# Patient Record
Sex: Female | Born: 1950 | Race: White | Hispanic: No | Marital: Married | Smoking: Former smoker
Health system: Southern US, Community
[De-identification: ages and names within clinical notes are randomized; demographics above are authoritative.]

## PROBLEM LIST (undated history)

## (undated) DIAGNOSIS — I10 Essential (primary) hypertension: Secondary | ICD-10-CM

## (undated) DIAGNOSIS — Q898 Other specified congenital malformations: Secondary | ICD-10-CM

## (undated) DIAGNOSIS — M720 Palmar fascial fibromatosis [Dupuytren]: Secondary | ICD-10-CM

## (undated) DIAGNOSIS — F32A Depression, unspecified: Secondary | ICD-10-CM

## (undated) DIAGNOSIS — E559 Vitamin D deficiency, unspecified: Secondary | ICD-10-CM

## (undated) DIAGNOSIS — M549 Dorsalgia, unspecified: Secondary | ICD-10-CM

## (undated) DIAGNOSIS — M79606 Pain in leg, unspecified: Secondary | ICD-10-CM

## (undated) DIAGNOSIS — C50919 Malignant neoplasm of unspecified site of unspecified female breast: Secondary | ICD-10-CM

## (undated) DIAGNOSIS — F329 Major depressive disorder, single episode, unspecified: Secondary | ICD-10-CM

## (undated) DIAGNOSIS — R600 Localized edema: Secondary | ICD-10-CM

## (undated) DIAGNOSIS — K219 Gastro-esophageal reflux disease without esophagitis: Secondary | ICD-10-CM

## (undated) DIAGNOSIS — M1990 Unspecified osteoarthritis, unspecified site: Secondary | ICD-10-CM

## (undated) DIAGNOSIS — IMO0002 Reserved for concepts with insufficient information to code with codable children: Secondary | ICD-10-CM

## (undated) DIAGNOSIS — M2550 Pain in unspecified joint: Secondary | ICD-10-CM

## (undated) DIAGNOSIS — E669 Obesity, unspecified: Secondary | ICD-10-CM

## (undated) DIAGNOSIS — M79673 Pain in unspecified foot: Secondary | ICD-10-CM

## (undated) DIAGNOSIS — E785 Hyperlipidemia, unspecified: Secondary | ICD-10-CM

## (undated) DIAGNOSIS — R0602 Shortness of breath: Secondary | ICD-10-CM

## (undated) DIAGNOSIS — F419 Anxiety disorder, unspecified: Secondary | ICD-10-CM

## (undated) DIAGNOSIS — G4730 Sleep apnea, unspecified: Secondary | ICD-10-CM

## (undated) HISTORY — DX: Reserved for concepts with insufficient information to code with codable children: IMO0002

## (undated) HISTORY — DX: Pain in leg, unspecified: M79.606

## (undated) HISTORY — DX: Unspecified osteoarthritis, unspecified site: M19.90

## (undated) HISTORY — PX: TONSILLECTOMY AND ADENOIDECTOMY: SUR1326

## (undated) HISTORY — DX: Hyperlipidemia, unspecified: E78.5

## (undated) HISTORY — PX: NASAL SINUS SURGERY: SHX719

## (undated) HISTORY — DX: Depression, unspecified: F32.A

## (undated) HISTORY — DX: Palmar fascial fibromatosis (dupuytren): M72.0

## (undated) HISTORY — PX: CATARACT EXTRACTION: SUR2

## (undated) HISTORY — DX: Other specified congenital malformations: Q89.8

## (undated) HISTORY — DX: Vitamin D deficiency, unspecified: E55.9

## (undated) HISTORY — DX: Malignant neoplasm of unspecified site of unspecified female breast: C50.919

## (undated) HISTORY — DX: Gastro-esophageal reflux disease without esophagitis: K21.9

## (undated) HISTORY — DX: Shortness of breath: R06.02

## (undated) HISTORY — DX: Sleep apnea, unspecified: G47.30

## (undated) HISTORY — DX: Obesity, unspecified: E66.9

## (undated) HISTORY — DX: Pain in unspecified joint: M25.50

## (undated) HISTORY — DX: Major depressive disorder, single episode, unspecified: F32.9

## (undated) HISTORY — DX: Essential (primary) hypertension: I10

## (undated) HISTORY — DX: Dorsalgia, unspecified: M54.9

## (undated) HISTORY — DX: Localized edema: R60.0

## (undated) HISTORY — DX: Pain in unspecified foot: M79.673

## (undated) HISTORY — DX: Anxiety disorder, unspecified: F41.9

---

## 1975-12-17 HISTORY — PX: TUBAL LIGATION: SHX77

## 1994-12-16 HISTORY — PX: PELVIC LAPAROSCOPY: SHX162

## 1998-03-16 ENCOUNTER — Other Ambulatory Visit: Admission: RE | Admit: 1998-03-16 | Discharge: 1998-03-16 | Payer: Self-pay | Admitting: Obstetrics and Gynecology

## 1998-09-28 ENCOUNTER — Ambulatory Visit (HOSPITAL_COMMUNITY): Admission: RE | Admit: 1998-09-28 | Discharge: 1998-09-28 | Payer: Self-pay | Admitting: Obstetrics and Gynecology

## 1999-03-02 ENCOUNTER — Other Ambulatory Visit: Admission: RE | Admit: 1999-03-02 | Discharge: 1999-03-02 | Payer: Self-pay | Admitting: Obstetrics and Gynecology

## 1999-10-30 ENCOUNTER — Ambulatory Visit (HOSPITAL_COMMUNITY): Admission: RE | Admit: 1999-10-30 | Discharge: 1999-10-30 | Payer: Self-pay | Admitting: Obstetrics and Gynecology

## 1999-10-30 ENCOUNTER — Encounter: Payer: Self-pay | Admitting: Obstetrics and Gynecology

## 2000-02-18 ENCOUNTER — Other Ambulatory Visit: Admission: RE | Admit: 2000-02-18 | Discharge: 2000-02-18 | Payer: Self-pay | Admitting: Obstetrics and Gynecology

## 2000-11-05 ENCOUNTER — Encounter: Payer: Self-pay | Admitting: Obstetrics and Gynecology

## 2000-11-05 ENCOUNTER — Ambulatory Visit (HOSPITAL_COMMUNITY): Admission: RE | Admit: 2000-11-05 | Discharge: 2000-11-05 | Payer: Self-pay | Admitting: Obstetrics and Gynecology

## 2001-03-25 ENCOUNTER — Other Ambulatory Visit: Admission: RE | Admit: 2001-03-25 | Discharge: 2001-03-25 | Payer: Self-pay | Admitting: Obstetrics and Gynecology

## 2001-11-09 ENCOUNTER — Encounter: Payer: Self-pay | Admitting: Family Medicine

## 2001-11-09 ENCOUNTER — Ambulatory Visit (HOSPITAL_COMMUNITY): Admission: RE | Admit: 2001-11-09 | Discharge: 2001-11-09 | Payer: Self-pay | Admitting: Family Medicine

## 2002-04-01 ENCOUNTER — Other Ambulatory Visit: Admission: RE | Admit: 2002-04-01 | Discharge: 2002-04-01 | Payer: Self-pay | Admitting: Obstetrics and Gynecology

## 2002-04-13 ENCOUNTER — Encounter: Admission: RE | Admit: 2002-04-13 | Discharge: 2002-04-13 | Payer: Self-pay | Admitting: Family Medicine

## 2002-04-13 ENCOUNTER — Encounter: Payer: Self-pay | Admitting: Family Medicine

## 2002-12-06 ENCOUNTER — Encounter: Payer: Self-pay | Admitting: Obstetrics and Gynecology

## 2002-12-06 ENCOUNTER — Ambulatory Visit (HOSPITAL_COMMUNITY): Admission: RE | Admit: 2002-12-06 | Discharge: 2002-12-06 | Payer: Self-pay | Admitting: Obstetrics and Gynecology

## 2003-04-12 ENCOUNTER — Other Ambulatory Visit: Admission: RE | Admit: 2003-04-12 | Discharge: 2003-04-12 | Payer: Self-pay | Admitting: Obstetrics and Gynecology

## 2003-10-04 ENCOUNTER — Ambulatory Visit (HOSPITAL_COMMUNITY): Admission: RE | Admit: 2003-10-04 | Discharge: 2003-10-04 | Payer: Self-pay | Admitting: Gastroenterology

## 2003-10-04 ENCOUNTER — Encounter (INDEPENDENT_AMBULATORY_CARE_PROVIDER_SITE_OTHER): Payer: Self-pay | Admitting: *Deleted

## 2003-12-14 ENCOUNTER — Ambulatory Visit (HOSPITAL_COMMUNITY): Admission: RE | Admit: 2003-12-14 | Discharge: 2003-12-14 | Payer: Self-pay | Admitting: Obstetrics and Gynecology

## 2003-12-21 ENCOUNTER — Encounter: Admission: RE | Admit: 2003-12-21 | Discharge: 2003-12-21 | Payer: Self-pay | Admitting: Obstetrics and Gynecology

## 2004-07-03 ENCOUNTER — Other Ambulatory Visit: Admission: RE | Admit: 2004-07-03 | Discharge: 2004-07-03 | Payer: Self-pay | Admitting: Obstetrics and Gynecology

## 2004-10-02 ENCOUNTER — Encounter: Admission: RE | Admit: 2004-10-02 | Discharge: 2004-10-02 | Payer: Self-pay | Admitting: Family Medicine

## 2005-01-08 ENCOUNTER — Ambulatory Visit (HOSPITAL_COMMUNITY): Admission: RE | Admit: 2005-01-08 | Discharge: 2005-01-08 | Payer: Self-pay | Admitting: Obstetrics and Gynecology

## 2005-07-16 ENCOUNTER — Other Ambulatory Visit: Admission: RE | Admit: 2005-07-16 | Discharge: 2005-07-16 | Payer: Self-pay | Admitting: Obstetrics and Gynecology

## 2006-01-29 ENCOUNTER — Ambulatory Visit (HOSPITAL_COMMUNITY): Admission: RE | Admit: 2006-01-29 | Discharge: 2006-01-29 | Payer: Self-pay | Admitting: Family Medicine

## 2006-10-27 ENCOUNTER — Other Ambulatory Visit: Admission: RE | Admit: 2006-10-27 | Discharge: 2006-10-27 | Payer: Self-pay | Admitting: Obstetrics & Gynecology

## 2007-02-26 ENCOUNTER — Ambulatory Visit (HOSPITAL_COMMUNITY): Admission: RE | Admit: 2007-02-26 | Discharge: 2007-02-26 | Payer: Self-pay | Admitting: Family Medicine

## 2007-10-29 ENCOUNTER — Other Ambulatory Visit: Admission: RE | Admit: 2007-10-29 | Discharge: 2007-10-29 | Payer: Self-pay | Admitting: Obstetrics and Gynecology

## 2008-03-01 ENCOUNTER — Ambulatory Visit (HOSPITAL_COMMUNITY): Admission: RE | Admit: 2008-03-01 | Discharge: 2008-03-01 | Payer: Self-pay | Admitting: Family Medicine

## 2008-12-01 ENCOUNTER — Ambulatory Visit (HOSPITAL_COMMUNITY): Admission: RE | Admit: 2008-12-01 | Discharge: 2008-12-02 | Payer: Self-pay | Admitting: Otolaryngology

## 2009-01-10 ENCOUNTER — Other Ambulatory Visit: Admission: RE | Admit: 2009-01-10 | Discharge: 2009-01-10 | Payer: Self-pay | Admitting: Obstetrics & Gynecology

## 2009-04-04 ENCOUNTER — Ambulatory Visit (HOSPITAL_COMMUNITY): Admission: RE | Admit: 2009-04-04 | Discharge: 2009-04-04 | Payer: Self-pay | Admitting: Family Medicine

## 2009-04-07 ENCOUNTER — Encounter: Admission: RE | Admit: 2009-04-07 | Discharge: 2009-04-07 | Payer: Self-pay | Admitting: Family Medicine

## 2010-05-07 ENCOUNTER — Encounter: Admission: RE | Admit: 2010-05-07 | Discharge: 2010-05-07 | Payer: Self-pay | Admitting: Family Medicine

## 2011-01-05 ENCOUNTER — Encounter: Payer: Self-pay | Admitting: Obstetrics and Gynecology

## 2011-01-06 ENCOUNTER — Encounter: Payer: Self-pay | Admitting: Family Medicine

## 2011-01-08 ENCOUNTER — Encounter
Admission: RE | Admit: 2011-01-08 | Discharge: 2011-01-08 | Payer: Self-pay | Source: Home / Self Care | Attending: Family Medicine | Admitting: Family Medicine

## 2011-04-30 NOTE — Op Note (Signed)
NAMEROBERT, SUNGA                ACCOUNT NO.:  [id number]   MEDICAL RECORD NO.:  [id number]          PATIENT TYPE:  OIB   LOCATION:  6532                         FACILITY:  [facility]   PHYSICIAN:  Kinnie Scales. Annalee Genta, M.D.DATE OF BIRTH:  [birth year]   DATE OF PROCEDURE:  12/01/2008  DATE OF DISCHARGE:                               OPERATIVE REPORT   PREOPERATIVE DIAGNOSES:  1. Deviated nasal septum with airway obstruction.  2. Status post prior nasal surgery.  3. Inferior turbinate hypertrophy.  4. Bilateral nasal vestibular collapse.  5. History of moderately severe obstructive sleep apnea.   POSTOPERATIVE DIAGNOSES:  1. Deviated nasal septum with airway obstruction.  2. Status post prior nasal surgery.  3. Inferior turbinate hypertrophy.  4. Bilateral nasal vestibular collapse.  5. History of moderately severe obstructive sleep apnea.   INDICATIONS FOR SURGERY:  1. Deviated nasal septum with airway obstruction.  2. Status post prior nasal surgery.  3. Inferior turbinate hypertrophy.  4. Bilateral nasal vestibular collapse.  5. History of moderately severe obstructive sleep apnea.   SURGICAL PROCEDURES:  1. Revision nasal septoplasty.  2. Bilateral inferior turbinate reduction.  3. Bilateral nasal vestibuloplasty.   SURGEON:  Kinnie Scales. Annalee Genta, MD   ANESTHESIA:  General endotracheal.   COMPLICATIONS:  None.   ESTIMATED BLOOD LOSS:  50 mL.   DISPOSITION:  The patient was transferred from the operating room to the  recovery room in stable condition.   BRIEF HISTORY:  Ms. Culmer is a 60 year old white female with history of  obstructive sleep apnea and progressive nasal airway obstruction.  She  had undergone prior nasal surgery approximately 30-40 years before  presentation.  She had significant issues with nasal obstruction and  nasal scarring with complete obstruction of the right nasal passageway.  Previous surgery also included some cosmetic procedure, which  resulted  in scarring and nasal vestibular collapse.  The patient has developed  obstructive sleep apnea and is unable to wear a CPAP because of nasal  airway obstruction.  She presents to our office for evaluation and  workup.  Given her history, examination, and findings, I recommended to  undertake the above surgical procedures.  The risks, benefits, and  possible complications of procedure were discussed in detail with the  patient who understood and concurred to our plan.  Surgery was scheduled  at the [facility] Main OR with overnight observation in  stepdown for monitoring of potential sleep apnea complications.   SURGICAL PROCEDURES:  The patient is a 60 year old white female who was  brought to the operating room at [facility] on December 01, 2008.  The patient was positioned on the operating table, prepped and  draped in sterile fashion.  General endotracheal anesthesia was  established without difficulty.  When the patient was adequately  anesthetized, her nose was injected with a total of 7 mL of 1% lidocaine  with 1:100,000 solution of epinephrine.  Local anesthetic was injected  in submucosal fashion on the nasal septum, inferior turbinates, and  lateral nasal wall bilaterally.  The patient's nose was then packed with  Afrin-soaked cottonoid pledgets and that was placed for approximately 10  minutes to allow for vasoconstriction and hemostasis.  With the patient  positioned, prepped and draped, surgical procedure was begun.   Revision nasal septoplasty was undertaken.  A right anterior  hemitransfixion incision was created.  The mucoperichondrial flap was  elevated from anterior to posterior along the patient's right-hand side.  Severely deviated bone and cartilage were identified, and there was a  significant amount of scarring from her previous nasal septal surgery.  A large inferior septal bone spur and hypertrophic cartilage growth was  then  mobilized.  Mucoperichondrial flap on the left-hand side was  elevated after crossing the mucoperichondrial junction.  Deviated bone  and cartilage were removed using a 4-mm osteotome and suction elevator  and resected.  Bone and cartilage were removed and then returned at the  conclusion of the surgical procedure.  The mucoperichondrial flaps  reapproximated with a 4-0 gut suture on a Keith needle in horizontal  mattress fashion.  At the conclusion of the surgery, bilateral Doyle  nasal septal splints were placed after the application of Bactroban  ointment and was sutured in position with a 3-0 Ethilon suture.   Bilateral inferior turbinate reduction was then performed with cautery  set at 12 watts.  Two submucosal passes were made in each inferior  turbinate.  When the turbinates had been adequately cauterized, a small  anterior incision was created and turbinate bone was resected preserving  the overlying mucosa.  Turbinates were then outfractured creating more  patent nasal cavity.   Bilateral nasal vestibuloplasty was then undertaken.  The patient had  significant collapse of the nasal valve at the level of the lower  lateral cartilage and lateral nasal ala.  Small approximately 5-mm stab  incision was created in the mucosa, and then a soft tissue tunnel was  elevated to the level of the nasal bony aperture bilaterally at the  anterior aspect of the maxilla.  A small portion of septal cartilage was  trimmed and positioned for a vestibular batten.  This was inserted to  the level of the maxillary aperture for stability and then sutured into  position.  The mucosa was closed with a 4-0 gut suture and a 4-0 PDS was  used to lateralize the nasal alar complex by suturing into the deep soft  tissue bilaterally, lateral cheek.  The same procedure was carried out  bilaterally with good nasal patency at the conclusion of the surgical  procedure.   The patient's nasal cavity, nasopharynx,  oral cavity, and oropharynx  were irrigated and suctioned.  Orogastric tube was passed.  Stomach  contents were aspirated.  The patient was then awakened from anesthetic.  She was extubated and was transferred from the operating room to the  recovery room in stable condition.  There were no complications.  The  blood loss was less than 50 mL.           ______________________________  Kinnie Scales. Annalee Genta, M.D.     DLS/MEDQ  D:  56/21/3086  T:  12/02/2008  Job:  [id number]

## 2011-05-03 NOTE — Op Note (Signed)
   NAME:  Jackie Montoya, Jackie Montoya                         ACCOUNT NO.:  [id number]   MEDICAL RECORD NO.:  [id number]                   PATIENT TYPE:  AMB   LOCATION:  ENDO                                 FACILITY:  [facility]   PHYSICIAN:  Graylin Shiver, M.D.                DATE OF BIRTH:  [birth year]   DATE OF PROCEDURE:  10/04/2003  DATE OF DISCHARGE:                                 OPERATIVE REPORT   PROCEDURE:  Colonoscopy with biopsy.   INDICATIONS FOR PROCEDURE:  Screening. Informed consent was obtained after  explanation of the risks of bleeding, infection and perforation.   PREMEDICATIONS:  Fentanyl 100 micrograms IV, Versed 10 mg IV.   DESCRIPTION OF PROCEDURE:  With the patient in the left lateral decubitus  position a rectal examination  was performed and no masses were felt. The  Olympus colonoscope was inserted into the rectum and advanced  around the  colon to the cecum.   The cecal landmarks were identified. The cecum and ascending colon were  normal. The transverse colon was normal. In the mid transverse colon there  was a small, 3-mm sessile polyp removed with cold forceps. The descending  colon, sigmoid and  rectum were normal. The patient tolerated the procedure  well without complications.   IMPRESSION:  A small  transverse colon polyp.   PLAN:  The pathology will be checked.                                               Graylin Shiver, M.D.    SFG/MEDQ  D:  10/04/2003  T:  10/04/2003  Job:  [id number]   cc:   Gretta Arab. Valentina Lucks, M.D.  [address] [place]  [place] [address]  Fax: [phone]

## 2011-05-31 ENCOUNTER — Other Ambulatory Visit: Payer: Self-pay | Admitting: Family Medicine

## 2011-05-31 ENCOUNTER — Ambulatory Visit
Admission: RE | Admit: 2011-05-31 | Discharge: 2011-05-31 | Disposition: A | Discharge status: Home / Self Care | Payer: BC Managed Care – PPO | Source: Ambulatory Visit | Attending: Family Medicine | Admitting: Family Medicine

## 2011-05-31 DIAGNOSIS — R053 Chronic cough: Secondary | ICD-10-CM

## 2011-05-31 DIAGNOSIS — R05 Cough: Secondary | ICD-10-CM

## 2011-06-04 ENCOUNTER — Other Ambulatory Visit: Payer: Self-pay | Admitting: Family Medicine

## 2011-06-04 DIAGNOSIS — Z1231 Encounter for screening mammogram for malignant neoplasm of breast: Secondary | ICD-10-CM

## 2011-06-10 ENCOUNTER — Ambulatory Visit
Admission: RE | Admit: 2011-06-10 | Discharge: 2011-06-10 | Disposition: A | Discharge status: Home / Self Care | Payer: BC Managed Care – PPO | Source: Ambulatory Visit | Attending: Family Medicine | Admitting: Family Medicine

## 2011-06-10 DIAGNOSIS — Z1231 Encounter for screening mammogram for malignant neoplasm of breast: Secondary | ICD-10-CM

## 2011-06-25 ENCOUNTER — Ambulatory Visit (INDEPENDENT_AMBULATORY_CARE_PROVIDER_SITE_OTHER): Payer: BC Managed Care – PPO | Admitting: Internal Medicine

## 2011-06-25 DIAGNOSIS — R05 Cough: Secondary | ICD-10-CM

## 2011-06-25 LAB — PULMONARY FUNCTION TEST

## 2011-06-25 NOTE — Progress Notes (Signed)
PFT done today. 

## 2011-09-20 LAB — CBC
HCT: 44.7 % (ref 36.0–46.0)
Hemoglobin: 15 g/dL (ref 12.0–15.0)
MCHC: 33.5 g/dL (ref 30.0–36.0)
MCV: 89.4 fL (ref 78.0–100.0)
RDW: 13 % (ref 11.5–15.5)
WBC: 7 10*3/uL (ref 4.0–10.5)

## 2011-09-20 LAB — BASIC METABOLIC PANEL
CO2: 29 mEq/L (ref 19–32)
Calcium: 9.6 mg/dL (ref 8.4–10.5)
Creatinine, Ser: 0.65 mg/dL (ref 0.4–1.2)
GFR calc Af Amer: >60 mL/min (ref >60)
GFR calc non Af Amer: >60 mL/min (ref >60)

## 2011-10-09 ENCOUNTER — Other Ambulatory Visit: Payer: Self-pay | Admitting: Dermatology

## 2012-06-02 ENCOUNTER — Other Ambulatory Visit: Payer: Self-pay | Admitting: Family Medicine

## 2012-06-02 DIAGNOSIS — Z1231 Encounter for screening mammogram for malignant neoplasm of breast: Secondary | ICD-10-CM

## 2012-06-22 ENCOUNTER — Ambulatory Visit: Payer: BC Managed Care – PPO

## 2012-07-20 ENCOUNTER — Ambulatory Visit
Admission: RE | Admit: 2012-07-20 | Discharge: 2012-07-20 | Disposition: A | Discharge status: Home / Self Care | Payer: BC Managed Care – PPO | Source: Ambulatory Visit | Attending: Family Medicine | Admitting: Family Medicine

## 2012-07-20 DIAGNOSIS — Z1231 Encounter for screening mammogram for malignant neoplasm of breast: Secondary | ICD-10-CM

## 2013-07-28 ENCOUNTER — Other Ambulatory Visit: Payer: Self-pay | Admitting: Nurse Practitioner

## 2013-07-28 NOTE — Telephone Encounter (Signed)
eScribe request for refill on VITAMIN D  Last filled -  Last AEX -   Next AEX - not scheduled

## 2013-07-29 NOTE — Telephone Encounter (Signed)
eScribe request for refill on VITAMIN D 50K Last filled - 07/22/12 X 1 YEAR Last AEX - 07/22/12 Last Vitamin D check - 07/22/12 = 34 Next AEX - not scheduled Please advise refills.  Paper chart on your shelf.

## 2013-07-29 NOTE — Telephone Encounter (Signed)
Ok to refill X 3 months until she comes in for AEX.

## 2013-07-30 NOTE — Telephone Encounter (Signed)
RX sent with note appt needed for more refills.

## 2013-09-14 ENCOUNTER — Other Ambulatory Visit: Payer: Self-pay

## 2013-09-14 DIAGNOSIS — Z1231 Encounter for screening mammogram for malignant neoplasm of breast: Secondary | ICD-10-CM

## 2013-10-01 ENCOUNTER — Ambulatory Visit
Admission: RE | Admit: 2013-10-01 | Discharge: 2013-10-01 | Disposition: A | Discharge status: Home / Self Care | Payer: BC Managed Care – PPO | Source: Ambulatory Visit

## 2013-10-01 DIAGNOSIS — Z1231 Encounter for screening mammogram for malignant neoplasm of breast: Secondary | ICD-10-CM

## 2013-10-16 DIAGNOSIS — R87619 Unspecified abnormal cytological findings in specimens from cervix uteri: Secondary | ICD-10-CM

## 2013-10-16 DIAGNOSIS — IMO0002 Reserved for concepts with insufficient information to code with codable children: Secondary | ICD-10-CM

## 2013-10-16 HISTORY — DX: Unspecified abnormal cytological findings in specimens from cervix uteri: R87.619

## 2013-10-16 HISTORY — DX: Reserved for concepts with insufficient information to code with codable children: IMO0002

## 2013-10-18 ENCOUNTER — Ambulatory Visit (INDEPENDENT_AMBULATORY_CARE_PROVIDER_SITE_OTHER): Payer: BC Managed Care – PPO | Admitting: Nurse Practitioner

## 2013-10-18 ENCOUNTER — Encounter: Payer: Self-pay | Admitting: Nurse Practitioner

## 2013-10-18 VITALS — BP 140/82 | HR 88 | Resp 20 | Ht 65.25 in | Wt 264.0 lb

## 2013-10-18 DIAGNOSIS — Z0000 Encounter for general adult medical examination without abnormal findings: Secondary | ICD-10-CM

## 2013-10-18 DIAGNOSIS — E559 Vitamin D deficiency, unspecified: Secondary | ICD-10-CM

## 2013-10-18 DIAGNOSIS — Z01419 Encounter for gynecological examination (general) (routine) without abnormal findings: Secondary | ICD-10-CM

## 2013-10-18 LAB — POCT URINALYSIS DIPSTICK
Bilirubin, UA: NEGATIVE
Blood, UA: NEGATIVE
Nitrite, UA: NEGATIVE
Protein, UA: NEGATIVE
Urobilinogen, UA: NEGATIVE
pH, UA: 5

## 2013-10-18 MED ORDER — VITAMIN D (ERGOCALCIFEROL) 1.25 MG (50000 UNIT) PO CAPS: 50000.0000 [IU] | ORAL_CAPSULE | ORAL | Status: DC

## 2013-10-18 NOTE — Progress Notes (Signed)
Patient ID: Jackie Montoya, female   DOB: 08-30-1951, 62 y.o.   MRN: [id number] 62 y.o. [id number] Widowed Caucasian Fe here for annual exam.  She feels well. No new health concerns except for a slight elevated blood sugar.  She is now on a diet and trying to increase exercise.  No LMP recorded. Patient is postmenopausal.          Sexually active: yes  The current method of family planning is post menopausal status.    Exercising: yes  Home exercise routine includes walking and trying to do weights, but not enough per patient.. Smoker:  former  Health Maintenance: Pap:  07/22/12, WNL, pos HR HPV, neg 16/18 MMG:  10/01/13, normal Colonoscopy:  2009, repeat in 5 years, will wait until 2015 for work reasons BMD:   01/30/06, normal.  Pt has had repeat since that time with Dr. Valentina Lucks and reports that was normal. TDaP:  07/22/12 Labs: HB: PCP Urine:    reports that she has quit smoking. She has never used smokeless tobacco. She reports that she drinks about 3.0 ounces of alcohol per week. She reports that she does not use illicit drugs.  Past Medical History  Diagnosis Date  . Hypertension   . Anxiety   . Hyperlipidemia   . Depression   . OA (osteoarthritis)   . Obesity     Past Surgical History  Procedure Laterality Date  . Nasal sinus surgery      Current Outpatient Prescriptions  Medication Sig Dispense Refill  . amLODipine (NORVASC) 5 MG tablet Take 5 mg by mouth daily.      Marland Kitchen atorvastatin (LIPITOR) 20 MG tablet Take 20 mg by mouth daily.      Marland Kitchen buPROPion (WELLBUTRIN XL) 300 MG 24 hr tablet Take 300 mg by mouth daily.      Marland Kitchen CALCIUM PO Take by mouth.      Marland Kitchen FLUoxetine (PROZAC) 40 MG capsule Take 40 mg by mouth daily.      . hydrochlorothiazide (HYDRODIURIL) 25 MG tablet Take 25 mg by mouth daily.      Marland Kitchen loratadine (CLARITIN) 10 MG tablet Take 10 mg by mouth daily.      . Multiple Vitamin (MULTIVITAMIN) tablet Take 1 tablet by mouth daily.      Marland Kitchen omeprazole (PRILOSEC) 20 MG capsule  Take 20 mg by mouth daily.      . Vitamin D, Ergocalciferol, (DRISDOL) 50000 UNITS CAPS capsule Take 1 capsule (50,000 Units total) by mouth every 7 (seven) days. Must have appt for more refills.  12 capsule  0   No current facility-administered medications for this visit.    Family History  Problem Relation Age of Onset  . Alzheimer's disease Mother   . Drug abuse Sister     overdose    ROS:  Pertinent items are noted in HPI.  Otherwise, a comprehensive ROS was negative.  Exam:   BP 140/82  Pulse 88  Resp 20  Ht 5' 5.25" (1.657 m)  Wt 264 lb (119.75 kg)  BMI 43.61 kg/m2 Height: 5' 5.25" (165.7 cm)  Ht Readings from Last 3 Encounters:  10/18/13 5' 5.25" (1.657 m)    General appearance: alert, cooperative and appears stated age Head: Normocephalic, without obvious abnormality, atraumatic Neck: no adenopathy, supple, symmetrical, trachea midline and thyroid normal to inspection and palpation Lungs: clear to auscultation bilaterally Breasts: normal appearance, no masses or tenderness Heart: regular rate and rhythm Abdomen: soft, non-tender; no masses,  no organomegaly  Extremities: extremities normal, atraumatic, no cyanosis or edema Skin: Skin color, texture, turgor normal. No rashes or lesions Lymph nodes: Cervical, supraclavicular, and axillary nodes normal. No abnormal inguinal nodes palpated Neurologic: Grossly normal   Pelvic: External genitalia:  no lesions              Urethra:  normal appearing urethra with no masses, tenderness or lesions              Bartholin's and Skene's: normal                 Vagina: normal appearing vagina with normal color and discharge, no lesions              Cervix: anteverted              Pap taken: yes Bimanual Exam:  Uterus:  normal size, contour, position, consistency, mobility, non-tender              Adnexa: no mass, fullness, tenderness               Rectovaginal: Confirms               Anus:  normal sphincter tone, no  lesions  A:  Well Woman with normal exam  Postmenopausal no HRT  History of normal pap with + HR HPV and negative # 16 & 18 07/2012  Vit d Deficiency  Situational anxiety and depression  Obesity, HTN,  and borderline DM   P:   Pap smear as per guidelines Repeated today  Mammogram due 10/15  Refill on Vit  D and will follow with labs  Counseled on breast self exam, adequate intake of calcium and vitamin D, diet and exercise return annually or prn  An After Visit Summary was printed and given to the patient.

## 2013-10-18 NOTE — Patient Instructions (Signed)

## 2013-10-19 LAB — VITAMIN D 25 HYDROXY (VIT D DEFICIENCY, FRACTURES): Vit D, 25-Hydroxy: 41 ng/mL (ref 30–89)

## 2013-10-20 ENCOUNTER — Telehealth: Payer: Self-pay | Admitting: *Deleted

## 2013-10-20 NOTE — Telephone Encounter (Signed)
I have attempted to contact this patient by phone with the following results: left message to return my call on answering machine (mobile/home). 

## 2013-10-20 NOTE — Telephone Encounter (Signed)
Message copied by Luisa Dago on Wed Oct 20, 2013 10:54 AM ------      Message from: Roanna Banning      Created: Tue Oct 19, 2013  6:19 PM       Let patient know result - pap is still pending.  Follow Vit D protocol. ------

## 2013-10-20 NOTE — Progress Notes (Signed)
Encounter reviewed by Dr. Nikky Duba Silva.  

## 2013-10-20 NOTE — Telephone Encounter (Signed)
Patient is returning a call to Stephanie. °

## 2013-10-20 NOTE — Telephone Encounter (Signed)
Return call to patient. Left message to call back. 

## 2013-10-25 NOTE — Telephone Encounter (Signed)
Pt notified by Almedia Balls of results.

## 2013-10-26 ENCOUNTER — Telehealth: Payer: Self-pay | Admitting: *Deleted

## 2013-10-26 NOTE — Telephone Encounter (Signed)
Call to patient to patient to review pap results, LMTCB on cell number.  Work number is general VM so no message left.

## 2013-10-26 NOTE — Telephone Encounter (Signed)
Message copied by Alisa Graff on Tue Oct 26, 2013 10:09 AM ------      Message from: Ria Comment R      Created: Fri Oct 22, 2013  5:22 PM       Last year pap normal with + HR HPV - then tested for # 16 & 18 that was negative.  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  Needs colpo biopsy ------

## 2013-10-26 NOTE — Telephone Encounter (Signed)
Patient returning [name]'s call. °

## 2013-10-27 NOTE — Telephone Encounter (Signed)
Message copied by Alisa Graff on Wed Oct 27, 2013 12:15 PM ------      Message from: Ria Comment R      Created: Fri Oct 22, 2013  5:22 PM       Last year pap normal with + HR HPV - then tested for # 16 & 18 that was negative.  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  Needs colpo biopsy ------

## 2013-10-27 NOTE — Telephone Encounter (Signed)
Patient returned call.  Advised that pap smear shows abnormal cells and colpo recommended.  Brief explanation of procedure given and instructed to take Motrin 800 mg 1 hour prior with food. Patient is PMP.  Due to patients scheduling restrictions, colpo scheduled for 11-23-13 with Dr Hyacinth Meeker.  Patient declined earlier appointment due to work schedule. Advised MD will review and if disagrees with appointment date, I will call her back.  Is this ok?  I offered several other options, including this Friday, patient declnied, needs late pm and on Tuesday or Wednesday.

## 2013-10-27 NOTE — Telephone Encounter (Signed)
Return call to patient, LMTCB.  

## 2013-10-28 NOTE — Telephone Encounter (Signed)
This is ok

## 2013-11-23 ENCOUNTER — Encounter: Payer: Self-pay | Admitting: Obstetrics & Gynecology

## 2013-11-23 ENCOUNTER — Ambulatory Visit (INDEPENDENT_AMBULATORY_CARE_PROVIDER_SITE_OTHER): Payer: BC Managed Care – PPO | Admitting: Obstetrics & Gynecology

## 2013-11-23 VITALS — BP 138/72 | HR 68 | Resp 16 | Wt 266.0 lb

## 2013-11-23 DIAGNOSIS — R6889 Other general symptoms and signs: Secondary | ICD-10-CM

## 2013-11-23 DIAGNOSIS — R87610 Atypical squamous cells of undetermined significance on cytologic smear of cervix (ASC-US): Secondary | ICD-10-CM

## 2013-11-23 DIAGNOSIS — R87810 Cervical high risk human papillomavirus (HPV) DNA test positive: Secondary | ICD-10-CM

## 2013-11-23 NOTE — Progress Notes (Signed)
Subjective:     Patient ID: Jackie Montoya, female   DOB: January 01, 1951, 62 y.o.   MRN: [id number]  HPI Very nice 62 yo G2P2 DWF with hx of ASCUS pap with +HR HPV obtained at [facility] with Shirlyn Goltz this year at [facility].  Pt had normal pap with + HR HPV 1 year ago at [facility].  Then HR HPV 16/18 subtypes were negative.  Pt is on fourth marriage and reports one of her husbands was very faithful.  She is divorced and in 8 year relationship with "boyfriend".  No other sexual partners.    Pt has many questions about HPV.  All addressed.  She feels she was most likely exposed with a prior marriage.  Risks for significant other discussed.  Management of this type of Pap smear, future treatment options discussed.    About 15 minutes in face to face counseling spent with pt before proceeding with disucssion of colposcopy.  Consent obtained.   Review of Systems  All other systems reviewed and are negative.       Objective:   Physical Exam  Constitutional: She is oriented to person, place, and time. She appears well-developed and well-nourished.  Genitourinary:    Musculoskeletal: Normal range of motion.  Neurological: She is alert and oriented to person, place, and time.  Skin: Skin is warm and dry.  Psychiatric: She has a normal mood and affect.   Speculum placed.  3% acetic acid applied to cervix for >45 seconds.  Cervix visualized with both 7.5X and 15X magnification.  Green filter also used.  Lugols solution was used.  Findings:  Endocervical polyp and decreased staining at 4 o'clock .  Polyp removed with polyp forceps by grasping and twisting at the base.  Biopsy:  4, polyp removal.  ECC:  was performed.  Monsel's was not needed.  Excellent hemostasis was present.  Pt tolerated procedure well and all instruments were removed.  Findings noted above on picture of cervix.     Assessment:     ASCUS pap with +HR HPV (negative 16/18 testing 1 year ago). H/O multiple sexual partners     Plan:       Colposcopic biopsy, polyp removal, and ECC pending.  IF CIN 2 or >, pt desirous of surgical treatment.  She would be most interested in hysterectomy over LEEP.  Differences discussed.   All questions answered.

## 2013-11-23 NOTE — Patient Instructions (Signed)

## 2013-11-29 ENCOUNTER — Telehealth: Payer: Self-pay | Admitting: Emergency Medicine

## 2013-11-29 NOTE — Telephone Encounter (Signed)
Message copied by Joeseph Amor on Mon Nov 29, 2013 12:09 PM ------      Message from: Jerene Bears      Created: Mon Nov 29, 2013 10:06 AM       Please call.  Biopsy showed CIN 1.  Polyp was benign.  ECC was negative.  Recommendation is to repeat Pap and HR HPV 1 year.  02 recall.            CC:  Lorrene Reid ------

## 2013-11-30 NOTE — Telephone Encounter (Signed)
I said 02 recall with first note but should be 08 recall.  Please make sure one is entered.

## 2013-11-30 NOTE — Telephone Encounter (Signed)
Spoke with patient and message from Dr. Hyacinth Meeker given. Verbalized understanding and emphasized need for follow up in one year. Patient is agreeable and will follow up. Declines scheduling appointment at this time. Will wait for letter.

## 2013-11-30 NOTE — Telephone Encounter (Signed)
Message copied by Joeseph Amor on Tue Nov 30, 2013  5:19 PM ------      Message from: Jerene Bears      Created: Mon Nov 29, 2013 10:06 AM       Please call.  Biopsy showed CIN 1.  Polyp was benign.  ECC was negative.  Recommendation is to repeat Pap and HR HPV 1 year.  02 recall.            CC:  Lorrene Reid ------

## 2013-12-01 NOTE — Telephone Encounter (Signed)
Marchelle Folks entered 08 recall for 09/2014.

## 2014-05-18 ENCOUNTER — Telehealth: Payer: Self-pay | Admitting: Nurse Practitioner

## 2014-05-18 NOTE — Telephone Encounter (Signed)
Left message on vm to call regarding records.

## 2014-09-23 ENCOUNTER — Other Ambulatory Visit: Payer: Self-pay

## 2014-09-23 DIAGNOSIS — Z1231 Encounter for screening mammogram for malignant neoplasm of breast: Secondary | ICD-10-CM

## 2014-10-04 ENCOUNTER — Ambulatory Visit: Payer: BC Managed Care – PPO

## 2014-10-17 ENCOUNTER — Encounter: Payer: Self-pay | Admitting: Obstetrics & Gynecology

## 2014-10-18 ENCOUNTER — Ambulatory Visit: Payer: BC Managed Care – PPO

## 2014-11-02 ENCOUNTER — Ambulatory Visit
Admission: RE | Admit: 2014-11-02 | Discharge: 2014-11-02 | Disposition: A | Discharge status: Home / Self Care | Payer: BC Managed Care – PPO | Source: Ambulatory Visit

## 2014-11-02 DIAGNOSIS — Z1231 Encounter for screening mammogram for malignant neoplasm of breast: Secondary | ICD-10-CM

## 2014-12-22 ENCOUNTER — Ambulatory Visit: Payer: BC Managed Care – PPO | Admitting: Nurse Practitioner

## 2015-02-22 ENCOUNTER — Ambulatory Visit: Payer: Self-pay | Admitting: Nurse Practitioner

## 2015-02-22 ENCOUNTER — Telehealth: Payer: Self-pay | Admitting: Nurse Practitioner

## 2015-02-22 NOTE — Telephone Encounter (Signed)
Patient canceled appointment. Separate staff message sent.

## 2015-02-28 NOTE — Telephone Encounter (Signed)
Pt cancelled A[facility].  She will get pap at PCP.  Pt has history of abnormal pap.  Message left for pt to return call to request pt have PCP forward pap results to our office.

## 2015-02-28 NOTE — Telephone Encounter (Signed)
Reminder placed to follow up in 03/2015 to assure pap has been done.

## 2015-03-01 NOTE — Telephone Encounter (Signed)
Return call from pt.  Advised patient that co-pay only collected at time of visit.  Pt is agreeable with scheduling and is transferred to [facility] to schedule.  New recall entered for 03/2015 due to history of abnormal.  Reminder removed.

## 2015-03-21 ENCOUNTER — Ambulatory Visit (INDEPENDENT_AMBULATORY_CARE_PROVIDER_SITE_OTHER): Payer: 59 | Admitting: Nurse Practitioner

## 2015-03-21 ENCOUNTER — Encounter: Payer: Self-pay | Admitting: Nurse Practitioner

## 2015-03-21 VITALS — BP 114/80 | HR 100 | Ht 65.0 in | Wt 260.2 lb

## 2015-03-21 DIAGNOSIS — Z01419 Encounter for gynecological examination (general) (routine) without abnormal findings: Secondary | ICD-10-CM | POA: Diagnosis not present

## 2015-03-21 DIAGNOSIS — Z0000 Encounter for general adult medical examination without abnormal findings: Secondary | ICD-10-CM | POA: Diagnosis not present

## 2015-03-21 DIAGNOSIS — R87610 Atypical squamous cells of undetermined significance on cytologic smear of cervix (ASC-US): Secondary | ICD-10-CM

## 2015-03-21 LAB — POCT URINALYSIS DIPSTICK
LEUKOCYTES UA: NEGATIVE
PH UA: 5
Urobilinogen, UA: NEGATIVE

## 2015-03-21 NOTE — Progress Notes (Signed)
64 y.o. G3P2 Widowed  Caucasian Fe here for annual exam.  No new health problems or surgeries. No increase in vaso symptoms.   Patient's last menstrual period was 06/15/2002.          Sexually active: Yes.    The current method of family planning is post menopausal status.    Exercising: Yes.    Curves 2-3x/wk Smoker:  no  Health Maintenance: Pap:  10/18/13 ASCUS Positive HPV had colpo Bx done 11/23/13 showed CIN 1 MMG:  11/03/14 Breast Density Category B; Bi-Rads 1: Negative Colonoscopy:  2014 Normal f/u in 5 years  BMD:   2015 TDaP:  07/22/12  Labs: Kelton Pillar, MD ; Urine: Negative   reports that she has quit smoking. She has never used smokeless tobacco. She reports that she drinks about 2.4 - 3.0 oz of alcohol per week. She reports that she does not use illicit drugs.  Past Medical History  Diagnosis Date  . Hypertension   . Anxiety   . Hyperlipidemia   . Depression   . OA (osteoarthritis)   . Obesity   . 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 Abnormal Pap smear 10/2013    Asc-us +HRHPV    Past Surgical History  Procedure Laterality Date  . Nasal sinus surgery    . Pelvic laparoscopy  1996    fibroids  . Tubal ligation Bilateral 1977  . Tonsillectomy and adenoidectomy  age 40    Current Outpatient Prescriptions  Medication Sig Dispense Refill  . amLODipine (NORVASC) 5 MG tablet Take 5 mg by mouth daily.    Marland Kitchen atorvastatin (LIPITOR) 20 MG tablet Take 20 mg by mouth daily.    Marland Kitchen buPROPion (WELLBUTRIN XL) 300 MG 24 hr tablet Take 300 mg by mouth daily.    Marland Kitchen CALCIUM PO Take by mouth daily.     . clonazePAM (KLONOPIN) 1 MG tablet as needed.   0  . FLUoxetine (PROZAC) 40 MG capsule Take 40 mg by mouth daily.    . fluticasone (FLONASE) 50 MCG/ACT nasal spray Place into both nostrils daily.    . hydrochlorothiazide (HYDRODIURIL) 25 MG tablet Take 25 mg by mouth daily.    Marland Kitchen loratadine (CLARITIN) 10 MG tablet Take 10 mg by mouth daily.    .  Multiple Vitamin (MULTIVITAMIN) tablet Take 1 tablet by mouth daily.    Marland Kitchen omeprazole (PRILOSEC) 20 MG capsule Take 20 mg by mouth daily.     No current facility-administered medications for this visit.    Family History  Problem Relation Age of Onset  . Alzheimer's disease Mother   . Hypertension Mother   . Drug abuse Sister     overdose  . Lung cancer Sister 42    ROS:  Pertinent items are noted in HPI.  Otherwise, a comprehensive ROS was negative.  Exam:   BP 114/80 mmHg  Pulse 100  Ht 5\' 5"  (1.651 m)  Wt 260 lb 3.2 oz (118.026 kg)  BMI 43.30 kg/m2  LMP 06/15/2002 Height: 5\' 5"  (165.1 cm) Ht Readings from Last 3 Encounters:  03/21/15 5\' 5"  (1.651 m)  10/18/13 5' 5.25" (1.657 m)    General appearance: alert, cooperative and appears stated age Head: Normocephalic, without obvious abnormality, atraumatic Neck: no adenopathy, supple, symmetrical, trachea midline and thyroid normal to inspection and palpation Lungs: clear to auscultation bilaterally Breasts: normal appearance, no masses or tenderness Heart: regular rate and rhythm Abdomen: soft, non-tender; no masses,  no organomegaly Extremities: extremities normal, atraumatic, no cyanosis or edema Skin: Skin color, texture, turgor normal. No rashes or lesions Lymph nodes: Cervical, supraclavicular, and axillary nodes normal. No abnormal inguinal nodes palpated Neurologic: Grossly normal   Pelvic: External genitalia:  no lesions              Urethra:  normal appearing urethra with no masses, tenderness or lesions              Bartholin's and Skene's: normal                 Vagina: normal appearing vagina with normal color and discharge, no lesions              Cervix: anteverted              Pap taken: Yes.   Bimanual Exam:  Uterus:  normal size, contour, position, consistency, mobility, non-tender              Adnexa: no mass, fullness, tenderness               Rectovaginal: Confirms               Anus:  normal  sphincter tone, no lesions  Chaperone present: Yes  A:  Well Woman with normal exam  Postmenopausal no HRT History of ASCUS pap with + HR HPV and Colpo biopsy 11/23/13 CIN I Vit d Deficiency Situational anxiety and depression Obesity, HTN, and borderline DM  P:   Reviewed health and wellness pertinent to exam  Pap smear taken today  Mammogram is due 11/16  Follow with pap 08  Counseled on breast self exam, mammography screening, adequate intake of calcium and vitamin D, diet and exercise, Kegel's exercises return annually or prn  An After Visit Summary was printed and given to the patient.

## 2015-03-21 NOTE — Patient Instructions (Signed)

## 2015-03-25 LAB — IPS PAP TEST WITH HPV

## 2015-03-26 NOTE — Progress Notes (Signed)
Encounter reviewed by Dr. Brook Silva.  

## 2015-10-04 ENCOUNTER — Other Ambulatory Visit: Payer: Self-pay

## 2015-10-04 DIAGNOSIS — Z1231 Encounter for screening mammogram for malignant neoplasm of breast: Secondary | ICD-10-CM

## 2015-11-07 ENCOUNTER — Ambulatory Visit: Payer: Self-pay

## 2015-11-30 ENCOUNTER — Ambulatory Visit
Admission: RE | Admit: 2015-11-30 | Discharge: 2015-11-30 | Disposition: A | Discharge status: Home / Self Care | Payer: 59 | Source: Ambulatory Visit

## 2015-11-30 DIAGNOSIS — Z1231 Encounter for screening mammogram for malignant neoplasm of breast: Secondary | ICD-10-CM

## 2016-04-03 ENCOUNTER — Ambulatory Visit (INDEPENDENT_AMBULATORY_CARE_PROVIDER_SITE_OTHER): Payer: BLUE CROSS/BLUE SHIELD | Admitting: Nurse Practitioner

## 2016-04-03 ENCOUNTER — Encounter: Payer: Self-pay | Admitting: Nurse Practitioner

## 2016-04-03 VITALS — BP 130/76 | HR 96 | Resp 16 | Ht 65.0 in | Wt 261.0 lb

## 2016-04-03 DIAGNOSIS — Z0000 Encounter for general adult medical examination without abnormal findings: Secondary | ICD-10-CM | POA: Diagnosis not present

## 2016-04-03 DIAGNOSIS — E559 Vitamin D deficiency, unspecified: Secondary | ICD-10-CM | POA: Diagnosis not present

## 2016-04-03 DIAGNOSIS — R87610 Atypical squamous cells of undetermined significance on cytologic smear of cervix (ASC-US): Secondary | ICD-10-CM | POA: Diagnosis not present

## 2016-04-03 DIAGNOSIS — Z01419 Encounter for gynecological examination (general) (routine) without abnormal findings: Secondary | ICD-10-CM

## 2016-04-03 LAB — POCT URINALYSIS DIPSTICK
Bilirubin, UA: NEGATIVE
Blood, UA: NEGATIVE
Glucose, UA: NEGATIVE
Ketones, UA: NEGATIVE
NITRITE UA: NEGATIVE
PROTEIN UA: NEGATIVE
Urobilinogen, UA: NEGATIVE
pH, UA: 6

## 2016-04-03 NOTE — Progress Notes (Signed)
Patient ID: Jackie Montoya, female   DOB: Apr 10, 1951, 65 y.o.   MRN: [id number]  64 y.o. G18P0002 Married Caucasian Fe here for annual exam.  Got married 02/19/16,   together for 11 yrs.  Recent problems with pain   Patient's last menstrual period was 06/15/2002.          Sexually active: Yes.    The current method of family planning is tubal ligation.    Exercising: Yes.    Hand Weights, eliptical, bike Smoker:  no  Health Maintenance: Pap: 03/22/15, Negative with neg HR HPV MMG:11/30/15, Breast Density Category B; Bi-Rads 1: Negative Colonoscopy: 2014 Normal, repeat in 5 years BMD: 2015? TDaP: 07/22/12 Shingles: None - discussed she will get at pharmacy once on Medicare Pneumonia: Unsure of which pneumonia vaccine given Hep C and HIV: done today Labs: done by PCP   reports that she has quit smoking. She has never used smokeless tobacco. She reports that she drinks about 2.4 - 3.0 oz of alcohol per week. She reports that she does not use illicit drugs.  Past Medical History  Diagnosis Date  . Hypertension   . Anxiety   . Hyperlipidemia   . Depression   . OA (osteoarthritis)   . Obesity   . 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 Abnormal Pap smear 10/2013    Asc-us +HRHPV    Past Surgical History  Procedure Laterality Date  . Nasal sinus surgery    . Pelvic laparoscopy  1996    fibroids  . Tubal ligation Bilateral 1977  . Tonsillectomy and adenoidectomy  age 44    Current Outpatient Prescriptions  Medication Sig Dispense Refill  . amLODipine (NORVASC) 5 MG tablet Take 5 mg by mouth daily.    Marland Kitchen atorvastatin (LIPITOR) 20 MG tablet Take 20 mg by mouth daily.    Marland Kitchen buPROPion (WELLBUTRIN XL) 300 MG 24 hr tablet Take 300 mg by mouth daily.    Marland Kitchen CALCIUM PO Take by mouth daily.     . clonazePAM (KLONOPIN) 1 MG tablet as needed.   0  . FLUoxetine (PROZAC) 40 MG capsule Take 40 mg by mouth daily.    . fluticasone (FLONASE) 50 MCG/ACT nasal spray  Place into both nostrils daily.    . hydrochlorothiazide (HYDRODIURIL) 25 MG tablet Take 25 mg by mouth daily.    Marland Kitchen loratadine (CLARITIN) 10 MG tablet Take 10 mg by mouth daily.    . Multiple Vitamin (MULTIVITAMIN) tablet Take 1 tablet by mouth daily.    Marland Kitchen omeprazole (PRILOSEC) 20 MG capsule Take 20 mg by mouth daily.     No current facility-administered medications for this visit.    Family History  Problem Relation Age of Onset  . Alzheimer's disease Mother   . Hypertension Mother   . Drug abuse Sister     overdose  . Lung cancer Sister 60    ROS:  Pertinent items are noted in HPI.  Otherwise, a comprehensive ROS was negative.  Exam:   BP 130/76 mmHg  Pulse 96  Resp 16  Ht 5\' 5"  (1.651 m)  Wt 261 lb (118.389 kg)  BMI 43.43 kg/m2  LMP 06/15/2002 Height: 5\' 5"  (165.1 cm) Ht Readings from Last 3 Encounters:  04/03/16 5\' 5"  (1.651 m)  03/21/15 5\' 5"  (1.651 m)  10/18/13 5' 5.25" (1.657 m)    General appearance: alert, cooperative and appears stated age Head: Normocephalic, without obvious abnormality, atraumatic Neck: no  adenopathy, supple, symmetrical, trachea midline and thyroid normal to inspection and palpation Lungs: clear to auscultation bilaterally Breasts: normal appearance, no masses or tenderness Heart: regular rate and rhythm Abdomen: soft, non-tender; no masses,  no organomegaly Extremities: extremities normal, atraumatic, no cyanosis or edema Skin: Skin color, texture, turgor normal. No rashes or lesions Lymph nodes: Cervical, supraclavicular, and axillary nodes normal. No abnormal inguinal nodes palpated Neurologic: Grossly normal   Pelvic: External genitalia:  no lesions              Urethra:  normal appearing urethra with no masses, tenderness or lesions              Bartholin's and Skene's: normal                 Vagina: normal appearing vagina with normal color and discharge, no lesions              Cervix: anteverted              Pap taken: Yes.    Bimanual Exam:  Uterus:  normal size, contour, position, consistency, mobility, non-tender              Adnexa: no mass, fullness, tenderness               Rectovaginal: Confirms               Anus:  normal sphincter tone, no lesions  Chaperone present: no  A:  Well Woman with normal exam  Postmenopausal no HRT History of ASCUS pap with + HR HPV and Colpo biopsy 11/23/13 CIN I Vit d Deficiency Situational anxiety and depression Obesity, HTN, and borderline DM   P:   Reviewed health and wellness pertinent to exam  Pap smear as above  Mammogram is due 12/17  Follow with labs and pap  Counseled on breast self exam, mammography screening, adequate intake of calcium and vitamin D, diet and exercise, Kegel's exercises return annually or prn  An After Visit Summary was printed and given to the patient.

## 2016-04-03 NOTE — Patient Instructions (Signed)
Dupuytren's Contracture Dupuytren's contracture affects the fingers and the palm of the hand. This condition usually develops slowly. It may take many years to develop. The pinky finger and the ring finger are most often affected. These fingers start to curve inward, like a claw. At some point, the fingers cannot go straight anymore. This can make it hard to do things like:  Put on gloves.  Shake hands.  Grab something off a shelf. The condition usually does not cause pain and is not dangerous    .EXERCISE AND DIET:  We recommended that you start or continue a regular exercise program for good health. Regular exercise means any activity that makes your heart beat faster and makes you sweat.  We recommend exercising at least 30 minutes per day at least 3 days a week, preferably 4 or 5.  We also recommend a diet low in fat and sugar.  Inactivity, poor dietary choices and obesity can cause diabetes, heart attack, stroke, and kidney damage, among others.    ALCOHOL AND SMOKING:  Women should limit their alcohol intake to no more than 7 drinks/beers/glasses of wine (combined, not each!) per week. Moderation of alcohol intake to this level decreases your risk of breast cancer and liver damage. And of course, no recreational drugs are part of a healthy lifestyle.  And absolutely no smoking or even second hand smoke. Most people know smoking can cause heart and lung diseases, but did you know it also contributes to weakening of your bones? Aging of your skin?  Yellowing of your teeth and nails?  CALCIUM AND VITAMIN D:  Adequate intake of calcium and Vitamin D are recommended.  The recommendations for exact amounts of these supplements seem to change often, but generally speaking 600 mg of calcium (either carbonate or citrate) and 800 units of Vitamin D per day seems prudent. Certain women may benefit from higher intake of Vitamin D.  If you are among these women, your doctor will have told you during your  visit.    PAP SMEARS:  Pap smears, to check for cervical cancer or precancers,  have traditionally been done yearly, although recent scientific advances have shown that most women can have pap smears less often.  However, every woman still should have a physical exam from her gynecologist every year. It will include a breast check, inspection of the vulva and vagina to check for abnormal growths or skin changes, a visual exam of the cervix, and then an exam to evaluate the size and shape of the uterus and ovaries.  And after 65 years of age, a rectal exam is indicated to check for rectal cancers. We will also provide age appropriate advice regarding health maintenance, like when you should have certain vaccines, screening for sexually transmitted diseases, bone density testing, colonoscopy, mammograms, etc.   MAMMOGRAMS:  All women over 67 years old should have a yearly mammogram. Many facilities now offer a "3D" mammogram, which may cost around $50 extra out of pocket. If possible,  we recommend you accept the option to have the 3D mammogram performed.  It both reduces the number of women who will be called back for extra views which then turn out to be normal, and it is better than the routine mammogram at detecting truly abnormal areas.    COLONOSCOPY:  Colonoscopy to screen for colon cancer is recommended for all women at age 83.  We know, you hate the idea of the prep.  We agree, BUT, having colon cancer  and not knowing it is worse!!  Colon cancer so often starts as a polyp that can be seen and removed at colonscopy, which can quite literally save your life!  And if your first colonoscopy is normal and you have no family history of colon cancer, most women don't have to have it again for 10 years.  Once every ten years, you can do something that may end up saving your life, right?  We will be happy to help you get it scheduled when you are ready.  Be sure to check your insurance coverage so you understand  how much it will cost.  It may be covered as a preventative service at no cost, but you should check your particular policy.     The condition gets its name from the doctor who came up with an operation to fix the problem. His name was Lanney Gins Dupuytren. Contracture means pulling inward. CAUSES  Dupuytren's contracture does not start with the fingers. It starts in the palm of the hand, under the skin. The tissue under the skin is called fascia. The fascia covers the cords (tendons) that control how the fingers move. In Dupuytren's contracture the fascia tissue becomes thick and then pulls on the cords. That causes the fingers to curl. The condition can affect both hands and any fingers, but it usually strikes one hand worse than the other. The fingers farthest from the thumb are most often the ones that curl. The cause is not clear. Some experts believe it results from an autoimmune reaction. That means the body's immune system (which fights off disease) attacks itself by mistake. What experts do know is that certain conditions and behaviors (called risk factors) make the chance of having this condition more likely. They include:  Age. Most people who have the condition are older than 50.  Sex. It affects men more often than women.  Family history. The condition tends to run in families from countries in [place] and [place].  Certain behaviors. People who smoke and drink alcohol are more apt to develop the problem.  Some other medical conditions. Having diabetes makes Dupuytren's contracture more likely. So does having a condition that involves a seizure (when the brain's function is interrupted). SYMPTOMS  Signs of this condition take time to develop. Sometimes this takes weeks or months. More often, it takes several years.   Early symptoms:  Skin on the palm of the hand becomes thick. This is usually the first sign.  The skin may look dimpled or puckered.  Lumps  (nodules) show up on the palm. There may be one or more lumps. They are not painful.  Later symptoms:  Thick cords of tissue form in the palm of the hand.  The pinky and ring fingers start to curl up into the palm.  The fingers cannot be straightened into their normal position. DIAGNOSIS  A physical examination is the main way that a healthcare provider can tell if you have Dupuytren's contracture. Other tests usually are not needed. The caregiver will probably:  Look at your hands. Feel your hands. This is to check for thickening and nodules.  Measure finger motion. This tells how much your fingers have contracted (pulled in).  Do a tabletop test. You will be asked to try to put your hand flat on a table, palm down. TREATMENT  There is no cure for Dupuytren's contracture. But there are ways to treat the symptoms. Options include:  Watching and waiting. The condition develops slowly.  Often it does not create problems for a long time. Sometimes the skin gets thick and nodules form, but the fingers never curl. So, in some cases it is best to just watch the condition carefully and wait to see what happens.  Shots (injections). Different substances may be injected, including:  Steroids. These drugs block swelling. These shots should make the condition less uncomfortable. Steroids may also slow down the condition. Shots are given into the nodules. The effect only lasts awhile. More shots may have to be given.  Enzymes. These are proteins. They weaken the thick tissue. After an injection, the caregiver usually stretches the fingers.  Needling. A needle is pushed through the skin and into the thick tissue. This is done in several spots. The goal is to break up the thickened tissue. Or to weaken it.  Surgery. This may be suggested if you cannot grasp objects. Or, if you can no longer put your hand in your pocket.  A cut (incision) is made in the palm of the hand. The thick tissue is  removed.  Sometimes the thick tissue is attached to the skin. Then, the skin must be removed, too. It is replaced with a piece of skin from another place on your body. That is called a skin graft.  Occupational or hand therapy is almost always needed after surgery. This involves special exercises to get back the use of your hand and fingers. After a skin graft, several months of therapy may be needed.  Sometimes the condition comes back, even after surgery.  Other methods. You can do some things on your own. They include:  Stretching the fingers backwards. Do this often.  Warming the hand and massaging it. Again, do this often.  Using tools with padded grips. This should make things easier.  Wearing heavy gloves while working. This protects the hands. PROGNOSIS  Dupuytren's contracture usually develops slowly. There is no cure. But, the symptoms can be treated. Sometimes they come back after treatment, but not always. It is important to remember that this is a functional problem and not a life-threatening condition.   This information is not intended to replace advice given to you by your health care provider. Make sure you discuss any questions you have with your health care provider.   Document Released: 09/29/2009 Document Revised: 12/23/2014 Document Reviewed: 09/29/2009 Elsevier Interactive Patient Education [employer].

## 2016-04-04 LAB — HEPATITIS C ANTIBODY: HCV AB: NEGATIVE

## 2016-04-04 LAB — HIV ANTIBODY (ROUTINE TESTING W REFLEX): HIV 1&2 Ab, 4th Generation: NONREACTIVE

## 2016-04-05 LAB — IPS PAP TEST WITH HPV

## 2016-04-05 NOTE — Progress Notes (Signed)
Reviewed personally.  M. Suzanne Malayasia Mirkin, MD.  

## 2016-05-22 DIAGNOSIS — H16203 Unspecified keratoconjunctivitis, bilateral: Secondary | ICD-10-CM | POA: Diagnosis not present

## 2016-05-22 DIAGNOSIS — H40051 Ocular hypertension, right eye: Secondary | ICD-10-CM | POA: Diagnosis not present

## 2016-05-22 DIAGNOSIS — H52203 Unspecified astigmatism, bilateral: Secondary | ICD-10-CM | POA: Diagnosis not present

## 2016-05-22 DIAGNOSIS — H40052 Ocular hypertension, left eye: Secondary | ICD-10-CM | POA: Diagnosis not present

## 2016-05-24 DIAGNOSIS — F900 Attention-deficit hyperactivity disorder, predominantly inattentive type: Secondary | ICD-10-CM | POA: Diagnosis not present

## 2016-05-24 DIAGNOSIS — R7309 Other abnormal glucose: Secondary | ICD-10-CM | POA: Diagnosis not present

## 2016-05-24 DIAGNOSIS — E782 Mixed hyperlipidemia: Secondary | ICD-10-CM | POA: Diagnosis not present

## 2016-05-24 DIAGNOSIS — J309 Allergic rhinitis, unspecified: Secondary | ICD-10-CM | POA: Diagnosis not present

## 2016-05-24 DIAGNOSIS — I10 Essential (primary) hypertension: Secondary | ICD-10-CM | POA: Diagnosis not present

## 2016-05-24 DIAGNOSIS — M1990 Unspecified osteoarthritis, unspecified site: Secondary | ICD-10-CM | POA: Diagnosis not present

## 2016-05-24 DIAGNOSIS — Z23 Encounter for immunization: Secondary | ICD-10-CM | POA: Diagnosis not present

## 2016-05-24 DIAGNOSIS — F39 Unspecified mood [affective] disorder: Secondary | ICD-10-CM | POA: Diagnosis not present

## 2016-05-24 DIAGNOSIS — Z0000 Encounter for general adult medical examination without abnormal findings: Secondary | ICD-10-CM | POA: Diagnosis not present

## 2016-05-24 DIAGNOSIS — G4733 Obstructive sleep apnea (adult) (pediatric): Secondary | ICD-10-CM | POA: Diagnosis not present

## 2016-05-24 DIAGNOSIS — K219 Gastro-esophageal reflux disease without esophagitis: Secondary | ICD-10-CM | POA: Diagnosis not present

## 2016-05-24 DIAGNOSIS — F419 Anxiety disorder, unspecified: Secondary | ICD-10-CM | POA: Diagnosis not present

## 2016-07-15 DIAGNOSIS — L720 Epidermal cyst: Secondary | ICD-10-CM | POA: Diagnosis not present

## 2016-09-16 DIAGNOSIS — Z23 Encounter for immunization: Secondary | ICD-10-CM | POA: Diagnosis not present

## 2016-11-13 ENCOUNTER — Other Ambulatory Visit: Payer: Self-pay | Admitting: Family Medicine

## 2016-11-13 DIAGNOSIS — Z1231 Encounter for screening mammogram for malignant neoplasm of breast: Secondary | ICD-10-CM

## 2016-11-25 DIAGNOSIS — E782 Mixed hyperlipidemia: Secondary | ICD-10-CM | POA: Diagnosis not present

## 2016-11-25 DIAGNOSIS — R7303 Prediabetes: Secondary | ICD-10-CM | POA: Diagnosis not present

## 2016-11-25 DIAGNOSIS — I10 Essential (primary) hypertension: Secondary | ICD-10-CM | POA: Diagnosis not present

## 2016-11-25 DIAGNOSIS — M720 Palmar fascial fibromatosis [Dupuytren]: Secondary | ICD-10-CM | POA: Diagnosis not present

## 2016-12-04 DIAGNOSIS — M79642 Pain in left hand: Secondary | ICD-10-CM | POA: Diagnosis not present

## 2016-12-04 DIAGNOSIS — M180 Bilateral primary osteoarthritis of first carpometacarpal joints: Secondary | ICD-10-CM | POA: Diagnosis not present

## 2016-12-04 DIAGNOSIS — M79641 Pain in right hand: Secondary | ICD-10-CM | POA: Diagnosis not present

## 2016-12-04 DIAGNOSIS — M720 Palmar fascial fibromatosis [Dupuytren]: Secondary | ICD-10-CM | POA: Diagnosis not present

## 2016-12-18 ENCOUNTER — Ambulatory Visit
Admission: RE | Admit: 2016-12-18 | Discharge: 2016-12-18 | Disposition: A | Discharge status: Home / Self Care | Payer: Medicare Other | Source: Ambulatory Visit | Attending: Family Medicine | Admitting: Family Medicine

## 2016-12-18 DIAGNOSIS — Z1231 Encounter for screening mammogram for malignant neoplasm of breast: Secondary | ICD-10-CM | POA: Diagnosis not present

## 2017-01-06 DIAGNOSIS — H43813 Vitreous degeneration, bilateral: Secondary | ICD-10-CM | POA: Diagnosis not present

## 2017-01-06 DIAGNOSIS — H25813 Combined forms of age-related cataract, bilateral: Secondary | ICD-10-CM | POA: Diagnosis not present

## 2017-01-06 DIAGNOSIS — D2311 Other benign neoplasm of skin of right eyelid, including canthus: Secondary | ICD-10-CM | POA: Diagnosis not present

## 2017-01-06 DIAGNOSIS — H40053 Ocular hypertension, bilateral: Secondary | ICD-10-CM | POA: Diagnosis not present

## 2017-01-10 DIAGNOSIS — L723 Sebaceous cyst: Secondary | ICD-10-CM | POA: Diagnosis not present

## 2017-01-10 DIAGNOSIS — L820 Inflamed seborrheic keratosis: Secondary | ICD-10-CM | POA: Diagnosis not present

## 2017-01-10 DIAGNOSIS — D225 Melanocytic nevi of trunk: Secondary | ICD-10-CM | POA: Diagnosis not present

## 2017-01-10 DIAGNOSIS — D2262 Melanocytic nevi of left upper limb, including shoulder: Secondary | ICD-10-CM | POA: Diagnosis not present

## 2017-01-10 DIAGNOSIS — D2272 Melanocytic nevi of left lower limb, including hip: Secondary | ICD-10-CM | POA: Diagnosis not present

## 2017-01-10 DIAGNOSIS — L821 Other seborrheic keratosis: Secondary | ICD-10-CM | POA: Diagnosis not present

## 2017-01-10 DIAGNOSIS — D485 Neoplasm of uncertain behavior of skin: Secondary | ICD-10-CM | POA: Diagnosis not present

## 2017-01-10 DIAGNOSIS — D2261 Melanocytic nevi of right upper limb, including shoulder: Secondary | ICD-10-CM | POA: Diagnosis not present

## 2017-01-10 DIAGNOSIS — D2271 Melanocytic nevi of right lower limb, including hip: Secondary | ICD-10-CM | POA: Diagnosis not present

## 2017-01-10 DIAGNOSIS — L814 Other melanin hyperpigmentation: Secondary | ICD-10-CM | POA: Diagnosis not present

## 2017-01-10 DIAGNOSIS — L720 Epidermal cyst: Secondary | ICD-10-CM | POA: Diagnosis not present

## 2017-01-10 DIAGNOSIS — L700 Acne vulgaris: Secondary | ICD-10-CM | POA: Diagnosis not present

## 2017-01-10 DIAGNOSIS — D692 Other nonthrombocytopenic purpura: Secondary | ICD-10-CM | POA: Diagnosis not present

## 2017-02-06 DIAGNOSIS — L988 Other specified disorders of the skin and subcutaneous tissue: Secondary | ICD-10-CM | POA: Diagnosis not present

## 2017-02-06 DIAGNOSIS — D485 Neoplasm of uncertain behavior of skin: Secondary | ICD-10-CM | POA: Diagnosis not present

## 2017-04-07 ENCOUNTER — Encounter: Payer: Self-pay | Admitting: Nurse Practitioner

## 2017-04-07 ENCOUNTER — Ambulatory Visit (INDEPENDENT_AMBULATORY_CARE_PROVIDER_SITE_OTHER): Payer: Medicare Other | Admitting: Nurse Practitioner

## 2017-04-07 VITALS — BP 144/76 | Ht 64.5 in | Wt 262.0 lb

## 2017-04-07 DIAGNOSIS — R87610 Atypical squamous cells of undetermined significance on cytologic smear of cervix (ASC-US): Secondary | ICD-10-CM

## 2017-04-07 DIAGNOSIS — Z01411 Encounter for gynecological examination (general) (routine) with abnormal findings: Secondary | ICD-10-CM | POA: Diagnosis not present

## 2017-04-07 DIAGNOSIS — E559 Vitamin D deficiency, unspecified: Secondary | ICD-10-CM

## 2017-04-07 NOTE — Progress Notes (Signed)
Patient ID: Jackie Montoya, female   DOB: Sep 09, 1951, 66 y.o.   MRN: [id number]  66 y.o. [id number] Married Caucasian Fe here for annual exam.  No new health problems except Dupuytren contracture seen at Ortho.  Pt not happy with Medicare changes as she wants to continue coming here.  Patient's last menstrual period was 06/15/2002.          Sexually active: Yes.    The current method of family planning is tubal ligation.    Exercising: Yes.    Curves Smoker:  no  Health Maintenance: Pap History: Pap: 04/03/16, Negative with neg HR HPV   Pap: 03/22/15, Negative with neg HR HPV   Colpo: 11/23/13, CIN I with neg ECC   Pap: 10/18/13, ASCUS with pos HR HPV History of Abnormal Pap: yes, ASCUS with pos HR HPV 2014 MMG: 12/18/16, 3D, Bi-Rads 1: Negative Self Breast exams: not regularly  Colonoscopy: 2014, repeat in 5 years due to family history colon cancer and personal history of polyps BMD: ?2015 with PCP TDaP:  07/22/12 Shingles: Never - will get new vaccine Pneumonia: 2017 ?Prevnar-13 Hep C and HIV: 04/03/16 Labs: PCP takes care of all labs   reports that she has quit smoking. She quit after 15.00 years of use. She has never used smokeless tobacco. She reports that she drinks about 2.4 - 3.0 oz of alcohol per week . She reports that she does not use drugs.  Past Medical History:  Diagnosis Date  . Abnormal Pap smear 10/2013   Asc-us +HRHPV  . Anxiety   . Depression   . Hyperlipidemia   . Hypertension   . OA (osteoarthritis)   . Obesity   . 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 NASAL SINUS SURGERY    . PELVIC LAPAROSCOPY  1996   fibroids  . TONSILLECTOMY AND ADENOIDECTOMY  age 7  . TUBAL LIGATION Bilateral 1977    Current Outpatient Prescriptions  Medication Sig Dispense Refill  . amLODipine (NORVASC) 5 MG tablet Take 5 mg by mouth daily.    Marland Kitchen atorvastatin (LIPITOR) 20 MG tablet Take 20 mg by mouth  daily.    Marland Kitchen buPROPion (WELLBUTRIN XL) 300 MG 24 hr tablet Take 300 mg by mouth daily.    Marland Kitchen FLUoxetine (PROZAC) 40 MG capsule Take 40 mg by mouth daily.    . fluticasone (FLONASE) 50 MCG/ACT nasal spray Place into both nostrils daily.    . hydrochlorothiazide (HYDRODIURIL) 25 MG tablet Take 25 mg by mouth daily.    Marland Kitchen loratadine (CLARITIN) 10 MG tablet Take 10 mg by mouth daily.    Marland Kitchen losartan (COZAAR) 25 MG tablet Take 25 mg by mouth daily.    . Multiple Vitamin (MULTIVITAMIN) tablet Take 1 tablet by mouth daily.    Marland Kitchen omeprazole (PRILOSEC) 20 MG capsule Take 20 mg by mouth daily.    Marland Kitchen CALCIUM PO Take by mouth daily.      No current facility-administered medications for this visit.     Family History  Problem Relation Age of Onset  . Alzheimer's disease Mother   . Hypertension Mother   . Drug abuse Sister     overdose  . Lung cancer Sister 56  . Colon cancer Sister 65    colectomy  . Colon cancer Paternal Grandmother     ?    ROS:  Pertinent items are noted in HPI.  Otherwise,  a comprehensive ROS was negative.  Exam:   BP (!) 144/76 (BP Location: Right Arm, Patient Position: Sitting)   Ht 5' 4.5" (1.638 m)   Wt 262 lb (118.8 kg)   LMP 06/15/2002   BMI 44.28 kg/m  Height: 5' 4.5" (163.8 cm) Ht Readings from Last 3 Encounters:  04/07/17 5' 4.5" (1.638 m)  04/03/16 5\' 5"  (1.651 m)  03/21/15 5\' 5"  (1.651 m)    General appearance: alert, cooperative and appears stated age Head: Normocephalic, without obvious abnormality, atraumatic Neck: no adenopathy, supple, symmetrical, trachea midline and thyroid normal to inspection and palpation Lungs: clear to auscultation bilaterally Breasts: normal appearance, no masses or tenderness Heart: regular rate and rhythm Abdomen: soft, non-tender; no masses,  no organomegaly Extremities: extremities normal, atraumatic, no cyanosis or edema Skin: Skin color, texture, turgor normal. No rashes or lesions Lymph nodes: Cervical,  supraclavicular, and axillary nodes normal. No abnormal inguinal nodes palpated Neurologic: Grossly normal   Pelvic: External genitalia:  no lesions              Urethra:  normal appearing urethra with no masses, tenderness or lesions              Bartholin's and Skene's: normal                 Vagina: normal appearing vagina with normal color and discharge, no lesions              Cervix: anteverted              Pap taken: Yes.   Bimanual Exam:  Uterus:  normal size, contour, position, consistency, mobility, non-tender              Adnexa: no mass, fullness, tenderness               Rectovaginal: Confirms               Anus:  normal sphincter tone, no lesions  Chaperone present: yes  A:  Well Woman with normal exam    Postmenopausal no HRT History of ASCUS pap with + HR HPV and Colpo biopsy 11/23/13 CIN I Vit D Deficiency - now on OTC Situational anxiety and depression Obesity, HTN, and borderline DM   P:   Reviewed health and wellness pertinent to exam  Pap smear: yes  Mammogram is due 12/2017  Counseled on breast self exam, mammography screening, adequate intake of calcium and vitamin D, diet and exercise, Kegel's exercises return annually or prn  An After Visit Summary was printed and given to the patient.

## 2017-04-07 NOTE — Patient Instructions (Addendum)

## 2017-04-08 LAB — IPS PAP SMEAR ONLY

## 2017-04-08 NOTE — Progress Notes (Signed)
Encounter reviewed by Dr. Kearia Yin Amundson C. Silva.  

## 2017-04-09 MED ORDER — METRONIDAZOLE 0.75 % VA GEL: 1.0000 | Freq: Every day | VAGINAL | 0 refills | Status: AC

## 2017-04-09 NOTE — Addendum Note (Signed)
Addended by: Abelino Derrick C on: 04/09/2017 11:45 AM   Modules accepted: Orders

## 2017-06-25 DIAGNOSIS — G4733 Obstructive sleep apnea (adult) (pediatric): Secondary | ICD-10-CM | POA: Diagnosis not present

## 2017-06-25 DIAGNOSIS — I10 Essential (primary) hypertension: Secondary | ICD-10-CM | POA: Diagnosis not present

## 2017-06-25 DIAGNOSIS — E782 Mixed hyperlipidemia: Secondary | ICD-10-CM | POA: Diagnosis not present

## 2017-06-25 DIAGNOSIS — J45909 Unspecified asthma, uncomplicated: Secondary | ICD-10-CM | POA: Diagnosis not present

## 2017-06-25 DIAGNOSIS — Z6841 Body Mass Index (BMI) 40.0 and over, adult: Secondary | ICD-10-CM | POA: Diagnosis not present

## 2017-06-25 DIAGNOSIS — F419 Anxiety disorder, unspecified: Secondary | ICD-10-CM | POA: Diagnosis not present

## 2017-06-25 DIAGNOSIS — Z23 Encounter for immunization: Secondary | ICD-10-CM | POA: Diagnosis not present

## 2017-06-25 DIAGNOSIS — R7303 Prediabetes: Secondary | ICD-10-CM | POA: Diagnosis not present

## 2017-06-25 DIAGNOSIS — Z1389 Encounter for screening for other disorder: Secondary | ICD-10-CM | POA: Diagnosis not present

## 2017-06-25 DIAGNOSIS — Z0000 Encounter for general adult medical examination without abnormal findings: Secondary | ICD-10-CM | POA: Diagnosis not present

## 2017-06-25 DIAGNOSIS — K219 Gastro-esophageal reflux disease without esophagitis: Secondary | ICD-10-CM | POA: Diagnosis not present

## 2017-06-25 DIAGNOSIS — F900 Attention-deficit hyperactivity disorder, predominantly inattentive type: Secondary | ICD-10-CM | POA: Diagnosis not present

## 2017-07-31 DIAGNOSIS — M8588 Other specified disorders of bone density and structure, other site: Secondary | ICD-10-CM | POA: Diagnosis not present

## 2017-08-25 DIAGNOSIS — J0100 Acute maxillary sinusitis, unspecified: Secondary | ICD-10-CM | POA: Diagnosis not present

## 2017-09-15 DIAGNOSIS — Z23 Encounter for immunization: Secondary | ICD-10-CM | POA: Diagnosis not present

## 2017-10-02 DIAGNOSIS — H5213 Myopia, bilateral: Secondary | ICD-10-CM | POA: Diagnosis not present

## 2017-10-02 DIAGNOSIS — H04123 Dry eye syndrome of bilateral lacrimal glands: Secondary | ICD-10-CM | POA: Diagnosis not present

## 2017-10-02 DIAGNOSIS — H40051 Ocular hypertension, right eye: Secondary | ICD-10-CM | POA: Diagnosis not present

## 2017-10-02 DIAGNOSIS — H25813 Combined forms of age-related cataract, bilateral: Secondary | ICD-10-CM | POA: Diagnosis not present

## 2017-10-08 DIAGNOSIS — G4733 Obstructive sleep apnea (adult) (pediatric): Secondary | ICD-10-CM | POA: Diagnosis not present

## 2017-10-21 DIAGNOSIS — G4733 Obstructive sleep apnea (adult) (pediatric): Secondary | ICD-10-CM | POA: Diagnosis not present

## 2017-12-24 ENCOUNTER — Other Ambulatory Visit: Payer: Self-pay | Admitting: Family Medicine

## 2017-12-24 DIAGNOSIS — G4733 Obstructive sleep apnea (adult) (pediatric): Secondary | ICD-10-CM | POA: Diagnosis not present

## 2017-12-24 DIAGNOSIS — Z1231 Encounter for screening mammogram for malignant neoplasm of breast: Secondary | ICD-10-CM

## 2017-12-31 DIAGNOSIS — E782 Mixed hyperlipidemia: Secondary | ICD-10-CM | POA: Diagnosis not present

## 2017-12-31 DIAGNOSIS — Z6841 Body Mass Index (BMI) 40.0 and over, adult: Secondary | ICD-10-CM | POA: Diagnosis not present

## 2017-12-31 DIAGNOSIS — I10 Essential (primary) hypertension: Secondary | ICD-10-CM | POA: Diagnosis not present

## 2017-12-31 DIAGNOSIS — G4733 Obstructive sleep apnea (adult) (pediatric): Secondary | ICD-10-CM | POA: Diagnosis not present

## 2017-12-31 DIAGNOSIS — R7303 Prediabetes: Secondary | ICD-10-CM | POA: Diagnosis not present

## 2018-01-13 ENCOUNTER — Ambulatory Visit
Admission: RE | Admit: 2018-01-13 | Discharge: 2018-01-13 | Disposition: A | Discharge status: Home / Self Care | Payer: Medicare Other | Source: Ambulatory Visit | Attending: Family Medicine | Admitting: Family Medicine

## 2018-01-13 DIAGNOSIS — Z1231 Encounter for screening mammogram for malignant neoplasm of breast: Secondary | ICD-10-CM | POA: Diagnosis not present

## 2018-01-22 DIAGNOSIS — M79672 Pain in left foot: Secondary | ICD-10-CM | POA: Diagnosis not present

## 2018-01-22 DIAGNOSIS — M79671 Pain in right foot: Secondary | ICD-10-CM | POA: Diagnosis not present

## 2018-02-04 DIAGNOSIS — R0902 Hypoxemia: Secondary | ICD-10-CM | POA: Diagnosis not present

## 2018-02-05 DIAGNOSIS — M85671 Other cyst of bone, right ankle and foot: Secondary | ICD-10-CM | POA: Diagnosis not present

## 2018-02-05 DIAGNOSIS — M79672 Pain in left foot: Secondary | ICD-10-CM | POA: Diagnosis not present

## 2018-02-05 DIAGNOSIS — M79671 Pain in right foot: Secondary | ICD-10-CM | POA: Diagnosis not present

## 2018-02-05 DIAGNOSIS — M7752 Other enthesopathy of left foot: Secondary | ICD-10-CM | POA: Diagnosis not present

## 2018-02-05 DIAGNOSIS — M722 Plantar fascial fibromatosis: Secondary | ICD-10-CM | POA: Diagnosis not present

## 2018-03-06 DIAGNOSIS — D225 Melanocytic nevi of trunk: Secondary | ICD-10-CM | POA: Diagnosis not present

## 2018-03-06 DIAGNOSIS — M720 Palmar fascial fibromatosis [Dupuytren]: Secondary | ICD-10-CM | POA: Diagnosis not present

## 2018-03-06 DIAGNOSIS — L814 Other melanin hyperpigmentation: Secondary | ICD-10-CM | POA: Diagnosis not present

## 2018-03-06 DIAGNOSIS — D2261 Melanocytic nevi of right upper limb, including shoulder: Secondary | ICD-10-CM | POA: Diagnosis not present

## 2018-03-06 DIAGNOSIS — L738 Other specified follicular disorders: Secondary | ICD-10-CM | POA: Diagnosis not present

## 2018-03-06 DIAGNOSIS — D2262 Melanocytic nevi of left upper limb, including shoulder: Secondary | ICD-10-CM | POA: Diagnosis not present

## 2018-03-06 DIAGNOSIS — D2271 Melanocytic nevi of right lower limb, including hip: Secondary | ICD-10-CM | POA: Diagnosis not present

## 2018-03-06 DIAGNOSIS — D1801 Hemangioma of skin and subcutaneous tissue: Secondary | ICD-10-CM | POA: Diagnosis not present

## 2018-03-06 DIAGNOSIS — D2272 Melanocytic nevi of left lower limb, including hip: Secondary | ICD-10-CM | POA: Diagnosis not present

## 2018-03-06 DIAGNOSIS — L723 Sebaceous cyst: Secondary | ICD-10-CM | POA: Diagnosis not present

## 2018-03-06 DIAGNOSIS — L821 Other seborrheic keratosis: Secondary | ICD-10-CM | POA: Diagnosis not present

## 2018-03-06 DIAGNOSIS — L820 Inflamed seborrheic keratosis: Secondary | ICD-10-CM | POA: Diagnosis not present

## 2018-03-11 DIAGNOSIS — M79671 Pain in right foot: Secondary | ICD-10-CM | POA: Diagnosis not present

## 2018-03-11 DIAGNOSIS — M722 Plantar fascial fibromatosis: Secondary | ICD-10-CM | POA: Diagnosis not present

## 2018-03-11 DIAGNOSIS — M7752 Other enthesopathy of left foot: Secondary | ICD-10-CM | POA: Diagnosis not present

## 2018-03-11 DIAGNOSIS — M79672 Pain in left foot: Secondary | ICD-10-CM | POA: Diagnosis not present

## 2018-04-09 DIAGNOSIS — H5213 Myopia, bilateral: Secondary | ICD-10-CM | POA: Diagnosis not present

## 2018-04-09 DIAGNOSIS — H25813 Combined forms of age-related cataract, bilateral: Secondary | ICD-10-CM | POA: Diagnosis not present

## 2018-04-09 DIAGNOSIS — H40013 Open angle with borderline findings, low risk, bilateral: Secondary | ICD-10-CM | POA: Diagnosis not present

## 2018-04-09 DIAGNOSIS — H40053 Ocular hypertension, bilateral: Secondary | ICD-10-CM | POA: Diagnosis not present

## 2018-04-29 ENCOUNTER — Ambulatory Visit: Payer: Medicare Other | Admitting: Obstetrics and Gynecology

## 2018-05-14 ENCOUNTER — Ambulatory Visit: Payer: Medicare Other | Admitting: Obstetrics and Gynecology

## 2018-06-09 ENCOUNTER — Ambulatory Visit: Payer: Medicare Other | Admitting: Obstetrics and Gynecology

## 2018-06-25 DIAGNOSIS — E782 Mixed hyperlipidemia: Secondary | ICD-10-CM | POA: Diagnosis not present

## 2018-06-25 DIAGNOSIS — J45909 Unspecified asthma, uncomplicated: Secondary | ICD-10-CM | POA: Diagnosis not present

## 2018-06-25 DIAGNOSIS — K219 Gastro-esophageal reflux disease without esophagitis: Secondary | ICD-10-CM | POA: Diagnosis not present

## 2018-06-25 DIAGNOSIS — F419 Anxiety disorder, unspecified: Secondary | ICD-10-CM | POA: Diagnosis not present

## 2018-06-25 DIAGNOSIS — F900 Attention-deficit hyperactivity disorder, predominantly inattentive type: Secondary | ICD-10-CM | POA: Diagnosis not present

## 2018-06-25 DIAGNOSIS — M8580 Other specified disorders of bone density and structure, unspecified site: Secondary | ICD-10-CM | POA: Diagnosis not present

## 2018-06-25 DIAGNOSIS — I10 Essential (primary) hypertension: Secondary | ICD-10-CM | POA: Diagnosis not present

## 2018-06-25 DIAGNOSIS — R7309 Other abnormal glucose: Secondary | ICD-10-CM | POA: Diagnosis not present

## 2018-06-25 DIAGNOSIS — Z1389 Encounter for screening for other disorder: Secondary | ICD-10-CM | POA: Diagnosis not present

## 2018-06-25 DIAGNOSIS — Z0000 Encounter for general adult medical examination without abnormal findings: Secondary | ICD-10-CM | POA: Diagnosis not present

## 2018-06-25 DIAGNOSIS — G4733 Obstructive sleep apnea (adult) (pediatric): Secondary | ICD-10-CM | POA: Diagnosis not present

## 2018-06-25 DIAGNOSIS — Z6841 Body Mass Index (BMI) 40.0 and over, adult: Secondary | ICD-10-CM | POA: Diagnosis not present

## 2018-10-09 DIAGNOSIS — H25813 Combined forms of age-related cataract, bilateral: Secondary | ICD-10-CM | POA: Diagnosis not present

## 2018-10-09 DIAGNOSIS — H52203 Unspecified astigmatism, bilateral: Secondary | ICD-10-CM | POA: Diagnosis not present

## 2018-10-09 DIAGNOSIS — H16203 Unspecified keratoconjunctivitis, bilateral: Secondary | ICD-10-CM | POA: Diagnosis not present

## 2018-10-09 DIAGNOSIS — H43813 Vitreous degeneration, bilateral: Secondary | ICD-10-CM | POA: Diagnosis not present

## 2018-10-14 DIAGNOSIS — Z23 Encounter for immunization: Secondary | ICD-10-CM | POA: Diagnosis not present

## 2018-10-27 DIAGNOSIS — H52202 Unspecified astigmatism, left eye: Secondary | ICD-10-CM | POA: Diagnosis not present

## 2018-10-27 DIAGNOSIS — H25812 Combined forms of age-related cataract, left eye: Secondary | ICD-10-CM | POA: Diagnosis not present

## 2018-10-27 DIAGNOSIS — H268 Other specified cataract: Secondary | ICD-10-CM | POA: Diagnosis not present

## 2019-01-04 ENCOUNTER — Other Ambulatory Visit: Payer: Self-pay | Admitting: Family Medicine

## 2019-01-04 DIAGNOSIS — Z1231 Encounter for screening mammogram for malignant neoplasm of breast: Secondary | ICD-10-CM

## 2019-01-13 DIAGNOSIS — G4733 Obstructive sleep apnea (adult) (pediatric): Secondary | ICD-10-CM | POA: Diagnosis not present

## 2019-01-19 DIAGNOSIS — F39 Unspecified mood [affective] disorder: Secondary | ICD-10-CM | POA: Diagnosis not present

## 2019-01-19 DIAGNOSIS — E782 Mixed hyperlipidemia: Secondary | ICD-10-CM | POA: Diagnosis not present

## 2019-01-19 DIAGNOSIS — Z6841 Body Mass Index (BMI) 40.0 and over, adult: Secondary | ICD-10-CM | POA: Diagnosis not present

## 2019-01-19 DIAGNOSIS — I10 Essential (primary) hypertension: Secondary | ICD-10-CM | POA: Diagnosis not present

## 2019-01-19 DIAGNOSIS — R7303 Prediabetes: Secondary | ICD-10-CM | POA: Diagnosis not present

## 2019-02-02 ENCOUNTER — Ambulatory Visit
Admission: RE | Admit: 2019-02-02 | Discharge: 2019-02-02 | Disposition: A | Discharge status: Home / Self Care | Payer: Medicare Other | Source: Ambulatory Visit | Attending: Family Medicine | Admitting: Family Medicine

## 2019-02-02 DIAGNOSIS — Z1231 Encounter for screening mammogram for malignant neoplasm of breast: Secondary | ICD-10-CM

## 2019-02-04 ENCOUNTER — Other Ambulatory Visit: Payer: Self-pay | Admitting: Family Medicine

## 2019-02-04 DIAGNOSIS — R928 Other abnormal and inconclusive findings on diagnostic imaging of breast: Secondary | ICD-10-CM

## 2019-02-09 ENCOUNTER — Ambulatory Visit
Admission: RE | Admit: 2019-02-09 | Discharge: 2019-02-09 | Disposition: A | Discharge status: Home / Self Care | Payer: Medicare Other | Source: Ambulatory Visit | Attending: Family Medicine | Admitting: Family Medicine

## 2019-02-09 ENCOUNTER — Other Ambulatory Visit: Payer: Self-pay | Admitting: Family Medicine

## 2019-02-09 DIAGNOSIS — N6320 Unspecified lump in the left breast, unspecified quadrant: Secondary | ICD-10-CM

## 2019-02-09 DIAGNOSIS — R922 Inconclusive mammogram: Secondary | ICD-10-CM | POA: Diagnosis not present

## 2019-02-09 DIAGNOSIS — R928 Other abnormal and inconclusive findings on diagnostic imaging of breast: Secondary | ICD-10-CM

## 2019-02-09 DIAGNOSIS — N6489 Other specified disorders of breast: Secondary | ICD-10-CM | POA: Diagnosis not present

## 2019-02-12 ENCOUNTER — Ambulatory Visit
Admission: RE | Admit: 2019-02-12 | Discharge: 2019-02-12 | Disposition: A | Discharge status: Home / Self Care | Payer: Medicare Other | Source: Ambulatory Visit | Attending: Family Medicine | Admitting: Family Medicine

## 2019-02-12 ENCOUNTER — Other Ambulatory Visit: Payer: Self-pay | Admitting: Family Medicine

## 2019-02-12 DIAGNOSIS — N6320 Unspecified lump in the left breast, unspecified quadrant: Secondary | ICD-10-CM

## 2019-02-12 DIAGNOSIS — N6012 Diffuse cystic mastopathy of left breast: Secondary | ICD-10-CM | POA: Diagnosis not present

## 2019-02-12 DIAGNOSIS — R928 Other abnormal and inconclusive findings on diagnostic imaging of breast: Secondary | ICD-10-CM

## 2019-04-19 ENCOUNTER — Ambulatory Visit: Payer: Medicare Other | Admitting: Obstetrics and Gynecology

## 2019-06-30 DIAGNOSIS — Z0000 Encounter for general adult medical examination without abnormal findings: Secondary | ICD-10-CM | POA: Diagnosis not present

## 2019-06-30 DIAGNOSIS — F900 Attention-deficit hyperactivity disorder, predominantly inattentive type: Secondary | ICD-10-CM | POA: Diagnosis not present

## 2019-06-30 DIAGNOSIS — I10 Essential (primary) hypertension: Secondary | ICD-10-CM | POA: Diagnosis not present

## 2019-06-30 DIAGNOSIS — Z1389 Encounter for screening for other disorder: Secondary | ICD-10-CM | POA: Diagnosis not present

## 2019-06-30 DIAGNOSIS — J309 Allergic rhinitis, unspecified: Secondary | ICD-10-CM | POA: Diagnosis not present

## 2019-06-30 DIAGNOSIS — E782 Mixed hyperlipidemia: Secondary | ICD-10-CM | POA: Diagnosis not present

## 2019-06-30 DIAGNOSIS — F39 Unspecified mood [affective] disorder: Secondary | ICD-10-CM | POA: Diagnosis not present

## 2019-06-30 DIAGNOSIS — K219 Gastro-esophageal reflux disease without esophagitis: Secondary | ICD-10-CM | POA: Diagnosis not present

## 2019-06-30 DIAGNOSIS — R7303 Prediabetes: Secondary | ICD-10-CM | POA: Diagnosis not present

## 2019-06-30 DIAGNOSIS — F419 Anxiety disorder, unspecified: Secondary | ICD-10-CM | POA: Diagnosis not present

## 2019-06-30 DIAGNOSIS — M1990 Unspecified osteoarthritis, unspecified site: Secondary | ICD-10-CM | POA: Diagnosis not present

## 2019-07-02 DIAGNOSIS — I10 Essential (primary) hypertension: Secondary | ICD-10-CM | POA: Diagnosis not present

## 2019-07-02 DIAGNOSIS — R7303 Prediabetes: Secondary | ICD-10-CM | POA: Diagnosis not present

## 2019-07-02 DIAGNOSIS — E782 Mixed hyperlipidemia: Secondary | ICD-10-CM | POA: Diagnosis not present

## 2019-08-27 DIAGNOSIS — Z23 Encounter for immunization: Secondary | ICD-10-CM | POA: Diagnosis not present

## 2019-12-16 ENCOUNTER — Encounter

## 2019-12-22 ENCOUNTER — Encounter (INDEPENDENT_AMBULATORY_CARE_PROVIDER_SITE_OTHER): Payer: Self-pay | Admitting: Bariatrics

## 2019-12-22 ENCOUNTER — Other Ambulatory Visit: Payer: Self-pay

## 2019-12-22 ENCOUNTER — Ambulatory Visit (INDEPENDENT_AMBULATORY_CARE_PROVIDER_SITE_OTHER): Payer: Medicare Other | Admitting: Bariatrics

## 2019-12-22 VITALS — BP 136/81 | Ht 65.0 in | Wt 287.0 lb

## 2019-12-22 DIAGNOSIS — Z1331 Encounter for screening for depression: Secondary | ICD-10-CM

## 2019-12-22 DIAGNOSIS — R7303 Prediabetes: Secondary | ICD-10-CM | POA: Diagnosis not present

## 2019-12-22 DIAGNOSIS — G4733 Obstructive sleep apnea (adult) (pediatric): Secondary | ICD-10-CM

## 2019-12-22 DIAGNOSIS — E782 Mixed hyperlipidemia: Secondary | ICD-10-CM | POA: Diagnosis not present

## 2019-12-22 DIAGNOSIS — Z0289 Encounter for other administrative examinations: Secondary | ICD-10-CM

## 2019-12-22 DIAGNOSIS — R0602 Shortness of breath: Secondary | ICD-10-CM

## 2019-12-22 DIAGNOSIS — K219 Gastro-esophageal reflux disease without esophagitis: Secondary | ICD-10-CM | POA: Diagnosis not present

## 2019-12-22 DIAGNOSIS — Z6841 Body Mass Index (BMI) 40.0 and over, adult: Secondary | ICD-10-CM

## 2019-12-22 DIAGNOSIS — R5383 Other fatigue: Secondary | ICD-10-CM | POA: Diagnosis not present

## 2019-12-22 DIAGNOSIS — E559 Vitamin D deficiency, unspecified: Secondary | ICD-10-CM

## 2019-12-23 ENCOUNTER — Encounter (INDEPENDENT_AMBULATORY_CARE_PROVIDER_SITE_OTHER): Payer: Self-pay | Admitting: Bariatrics

## 2019-12-23 DIAGNOSIS — E8881 Metabolic syndrome: Secondary | ICD-10-CM | POA: Insufficient documentation

## 2019-12-23 DIAGNOSIS — I10 Essential (primary) hypertension: Secondary | ICD-10-CM | POA: Insufficient documentation

## 2019-12-23 DIAGNOSIS — Z6841 Body Mass Index (BMI) 40.0 and over, adult: Secondary | ICD-10-CM | POA: Insufficient documentation

## 2019-12-23 DIAGNOSIS — E559 Vitamin D deficiency, unspecified: Secondary | ICD-10-CM | POA: Insufficient documentation

## 2019-12-23 LAB — COMPREHENSIVE METABOLIC PANEL
ALT: 20 IU/L (ref 0–32)
AST: 21 IU/L (ref 0–40)
Albumin/Globulin Ratio: 2.1 (ref 1.2–2.2)
Albumin: 4.6 g/dL (ref 3.8–4.8)
Alkaline Phosphatase: 126 IU/L — ABNORMAL HIGH (ref 39–117)
BUN/Creatinine Ratio: 19 (ref 12–28)
BUN: 14 mg/dL (ref 8–27)
Bilirubin Total: 0.6 mg/dL (ref 0.0–1.2)
CO2: 24 mmol/L (ref 20–29)
Calcium: 9.7 mg/dL (ref 8.7–10.3)
Chloride: 97 mmol/L (ref 96–106)
Creatinine, Ser: 0.74 mg/dL (ref 0.57–1.00)
GFR calc Af Amer: 96 mL/min/{1.73_m2} (ref >59)
GFR calc non Af Amer: 84 mL/min/{1.73_m2} (ref >59)
Globulin, Total: 2.2 g/dL (ref 1.5–4.5)
Glucose: 105 mg/dL — ABNORMAL HIGH (ref 65–99)
Potassium: 4 mmol/L (ref 3.5–5.2)
Sodium: 138 mmol/L (ref 134–144)
Total Protein: 6.8 g/dL (ref 6.0–8.5)

## 2019-12-23 LAB — LIPID PANEL WITH LDL/HDL RATIO
Cholesterol, Total: 206 mg/dL — ABNORMAL HIGH (ref 100–199)
HDL: 77 mg/dL (ref >39)
LDL Chol Calc (NIH): 109 mg/dL — ABNORMAL HIGH (ref 0–99)
LDL/HDL Ratio: 1.4 ratio (ref 0.0–3.2)
Triglycerides: 116 mg/dL (ref 0–149)
VLDL Cholesterol Cal: 20 mg/dL (ref 5–40)

## 2019-12-23 LAB — T4, FREE: Free T4: 1.25 ng/dL (ref 0.82–1.77)

## 2019-12-23 LAB — TSH: TSH: 1.87 u[IU]/mL (ref 0.450–4.500)

## 2019-12-23 LAB — HEMOGLOBIN A1C
Est. average glucose Bld gHb Est-mCnc: 111 mg/dL
Hgb A1c MFr Bld: 5.5 % (ref 4.8–5.6)

## 2019-12-23 LAB — VITAMIN D 25 HYDROXY (VIT D DEFICIENCY, FRACTURES): Vit D, 25-Hydroxy: 29.7 ng/mL — ABNORMAL LOW (ref 30.0–100.0)

## 2019-12-23 LAB — INSULIN, RANDOM: INSULIN: 11.7 u[IU]/mL (ref 2.6–24.9)

## 2019-12-23 LAB — T3: T3, Total: 128 ng/dL (ref 71–180)

## 2019-12-29 DIAGNOSIS — L814 Other melanin hyperpigmentation: Secondary | ICD-10-CM | POA: Diagnosis not present

## 2019-12-29 DIAGNOSIS — L821 Other seborrheic keratosis: Secondary | ICD-10-CM | POA: Diagnosis not present

## 2019-12-29 DIAGNOSIS — L820 Inflamed seborrheic keratosis: Secondary | ICD-10-CM | POA: Diagnosis not present

## 2019-12-29 DIAGNOSIS — D2271 Melanocytic nevi of right lower limb, including hip: Secondary | ICD-10-CM | POA: Diagnosis not present

## 2019-12-29 DIAGNOSIS — L430 Hypertrophic lichen planus: Secondary | ICD-10-CM | POA: Diagnosis not present

## 2019-12-29 DIAGNOSIS — D485 Neoplasm of uncertain behavior of skin: Secondary | ICD-10-CM | POA: Diagnosis not present

## 2019-12-29 DIAGNOSIS — L720 Epidermal cyst: Secondary | ICD-10-CM | POA: Diagnosis not present

## 2019-12-29 DIAGNOSIS — L304 Erythema intertrigo: Secondary | ICD-10-CM | POA: Diagnosis not present

## 2019-12-29 DIAGNOSIS — D1801 Hemangioma of skin and subcutaneous tissue: Secondary | ICD-10-CM | POA: Diagnosis not present

## 2019-12-29 DIAGNOSIS — D2272 Melanocytic nevi of left lower limb, including hip: Secondary | ICD-10-CM | POA: Diagnosis not present

## 2019-12-29 NOTE — Progress Notes (Signed)
Dear Dr. Kelton Pillar,   Thank you for referring Jackie Montoya to our clinic. The following note includes my evaluation and treatment recommendations.  Chief Complaint:   OBESITY Jackie Montoya (MR# [id number]) is a 69 y.o. female who presents for evaluation and treatment of obesity and related comorbidities. Current BMI is Body mass index is 47.76 kg/m.Marland Kitchen Jackie Montoya has been struggling with her weight for many years and has been unsuccessful in either losing weight, maintaining weight loss, or reaching her healthy weight goal.  Jackie Montoya states she is currently in the action stage of change and ready to dedicate time achieving and maintaining a healthier weight. Jackie Montoya is interested in becoming our patient and working on intensive lifestyle modifications including (but not limited to) diet and exercise for weight loss.  Jackie Montoya does like to cook and enjoys sweets and carbohydrates. She does skip lunch. She would like to be vegetarian.  Jackie Montoya's habits were reviewed today and are as follows: Her family eats meals together, she thinks her family will eat healthier with her, her desired weight loss is 97 lbs, she started gaining weight after her second child, her heaviest weight ever was 287 pounds, she craves sweets, potato chips, bread and pasta, she skips lunch often, she is frequently drinking liquids with calories, she frequently makes poor food choices, she has problems with excessive hunger, she frequently eats larger portions than normal, she has binge eating behaviors and she struggles with emotional eating.  Depression Screen Jackie Montoya's Food and Mood (modified PHQ-9) score was 17.  Depression screen PHQ 2/9 12/22/2019  Decreased Interest 3  Down, Depressed, Hopeless 2  PHQ - 2 Score 5  Altered sleeping 2  Tired, decreased energy 3  Change in appetite 2  Feeling bad or failure about yourself  1  Trouble concentrating 2  Moving slowly or fidgety/restless 2  Suicidal thoughts 0  PHQ-9 Score  17  Difficult doing work/chores Somewhat difficult   Subjective:   Other fatigue. Jackie Montoya feels her energy is lower than it should be. This has worsened with weight gain and has worsened recently. Jackie Montoya denies daytime somnolence and reports waking up refreshed and tired. Patient is at risk for obstructive sleep apnea. Patient generally gets 10-12 hours of sleep per night, and states they generally have restful sleep (uses CPAP). Snoring is present. Apneic episodes are not present. Epworth Sleepiness Score is 10.  Shortness of breath on exertion. Jackie Montoya notes increasing shortness of breath with exercising and seems to be worsening over time with weight gain. She notes getting out of breath sooner with activity than she used to. This has gotten worse recently. Jackie Montoya denies shortness of breath at rest or orthopnea.  OSA (obstructive sleep apnea). Jackie Montoya wears CPAP and reports restful sleep.  Vitamin D deficiency. Jackie Montoya is taking Vitamin D.  Gastroesophageal reflux disease, unspecified whether esophagitis present. Jackie Montoya is taking Prilosec and reports GERD is controlled with this medication.  Mixed hyperlipidemia. Jackie Montoya has hyperlipidemia and has been trying to improve her cholesterol levels with intensive lifestyle modification including a low saturated fat diet, exercise and weight loss. She denies myalgias. She is taking Lipitor.  Prediabetes. Jackie Montoya has a diagnosis of prediabetes based on her elevated HgA1c and was informed this puts her at greater risk of developing diabetes. She continues to work on diet and exercise to decrease her risk of diabetes. She denies nausea or hypoglycemia. No polyphagia. She skips lunch.  Depression screening.  Assessment/Plan:   Other fatigue. Jackie Montoya was informed  fatigue may be related to obesity, depression or many other causes. Labs will be ordered, and in the meanwhile Jackie Montoya has agreed to work on diet, exercise and weight loss. EKG 12-Lead, T3, T4, free, TSH  ordered.   Shortness of breath on exertion. [name]'s shortness of breath appears to be obesity related and exercise induced. She has agreed to work on weight loss and gradually increase exercise to treat her exercise induced shortness of breath. Will continue to monitor closely. Lipid Panel With LDL/HDL Ratio ordered. She will gradually increase exercise.  OSA (obstructive sleep apnea). Jackie Montoya will continue using her CPAP.  Vitamin D deficiency. Low Vitamin D level contributes to fatigue and are associated with obesity, breast, and colon cancer. She agrees to have Vitamin D (25 hydroxy) level checked.  Gastroesophageal reflux disease, unspecified whether esophagitis present.  Mixed hyperlipidemia. Cardiovascular risk and specific lipid/LDL goals reviewed.  We discussed several lifestyle modifications today and Jackie Montoya will continue to work on diet, exercise and weight loss efforts. Orders and follow up as documented in patient record. Lipid Panel With LDL/HDL Ratio ordered. She will continue her medications.  Counseling Intensive lifestyle modifications are the first line treatment for this issue. . Dietary changes: Increase soluble fiber. Decrease simple carbohydrates. . Exercise changes: Moderate to vigorous-intensity aerobic activity 150 minutes per week if tolerated. . Lipid-lowering medications: see documented in medical record.   Prediabetes. Jackie Montoya will continue to work on weight loss, exercise, and decreasing simple carbohydrates to help decrease the risk of diabetes. HgB A1c, Insulin, random, Comprehensive Metabolic Panel (CMET) ordered.  Depression screening.  Class 3 severe obesity with serious comorbidity and body mass index (BMI) of 45.0 to 49.9 in adult, unspecified obesity type (Jackie Montoya).  Jackie Montoya is currently in the action stage of change and her goal is to continue with weight loss efforts. I recommend Jackie Montoya begin the structured treatment plan as follows:  She has agreed to the  Category 3 Plan.  She will stop all soda and sugary drinks.  We discussed the following exercise goals today: Older adults should follow the adult guidelines. When older adults cannot meet the adult guidelines, they should be as physically active as their abilities and conditions will allow.  Older adults should do exercises that maintain or improve balance if they are at risk of falling.    We discussed the following behavioral modification strategies today: increasing lean protein intake, decreasing simple carbohydrates, increasing vegetables, increasing water intake, decreasing eating out, no skipping meals, meal planning and cooking strategies and keeping healthy foods in the home.  She was informed of the importance of frequent follow-up visits to maximize her success with intensive lifestyle modifications for her multiple health conditions. She was informed we would discuss her lab results at her next visit unless there is a critical issue that needs to be addressed sooner. Jackie Montoya agreed to keep her next visit at the agreed upon time to discuss these results.  Objective:   Blood pressure 136/81, height 5\' 5"  (1.651 m), weight 287 lb (130.2 kg), last menstrual period 06/15/2002. Body mass index is 47.76 kg/m.  Indirect Calorimeter completed today shows a VO2 of 269 and a REE of 1874.  Her calculated basal metabolic rate is [id number] thus her basal metabolic rate is better than expected.  General: Cooperative, alert, well developed, in no acute distress. HEENT: Conjunctivae and lids unremarkable. Neck: No thyromegaly.  Cardiovascular: Regular rhythm.  Lungs: Normal work of breathing. Extremities: No edema.  Neurologic: No focal deficits.  Lab Results  Component Value Date   CREATININE 0.74 12/22/2019   BUN 14 12/22/2019   NA 138 12/22/2019   K 4.0 12/22/2019   CL 97 12/22/2019   CO2 24 12/22/2019   Lab Results  Component Value Date   ALT 20 12/22/2019   AST 21 12/22/2019    ALKPHOS 126 (H) 12/22/2019   BILITOT 0.6 12/22/2019   Lab Results  Component Value Date   HGBA1C 5.5 12/22/2019   Lab Results  Component Value Date   INSULIN 11.7 12/22/2019   Lab Results  Component Value Date   TSH 1.870 12/22/2019   Lab Results  Component Value Date   CHOL 206 (H) 12/22/2019   HDL 77 12/22/2019   LDLCALC 109 (H) 12/22/2019   TRIG 116 12/22/2019   Lab Results  Component Value Date   WBC 7.0 11/29/2008   HGB 15.0 11/29/2008   HCT 44.7 11/29/2008   MCV 89.4 11/29/2008   PLT 242 11/29/2008   No results found for: IRON, TIBC, FERRITIN  Obesity Behavioral Intervention Visit Documentation for Insurance:   Approximately 15 minutes were spent on the discussion below.  ASK: We discussed the diagnosis of obesity with Jackie Montoya today and Jackie Montoya agreed to give [place] permission to discuss obesity behavioral modification therapy today.  ASSESS: Jackie Montoya has the diagnosis of obesity and her BMI today is 47.9. Jackie Montoya is in the action stage of change.   ADVISE: Jackie Montoya was educated on the multiple health risks of obesity as well as the benefit of weight loss to improve her health. She was advised of the need for long term treatment and the importance of lifestyle modifications to improve her current health and to decrease her risk of future health problems.  AGREE: Multiple dietary modification options and treatment options were discussed and Kande agreed to follow the recommendations documented in the above note.  ARRANGE: Jackie Montoya was educated on the importance of frequent visits to treat obesity as outlined per CMS and USPSTF guidelines and agreed to schedule her next follow up appointment today.  Attestation Statements:   Reviewed by clinician on day of visit: allergies, medications, problem list, medical history, surgical history, family history, social history, and previous encounter notes.  Migdalia Dk, am acting as Location manager for [employer], DO   I  have reviewed the above documentation for accuracy and completeness, and I agree with the above. Jearld Lesch, DO

## 2020-01-03 ENCOUNTER — Encounter (INDEPENDENT_AMBULATORY_CARE_PROVIDER_SITE_OTHER): Payer: Self-pay | Admitting: Bariatrics

## 2020-01-05 ENCOUNTER — Other Ambulatory Visit: Payer: Self-pay

## 2020-01-05 ENCOUNTER — Encounter (INDEPENDENT_AMBULATORY_CARE_PROVIDER_SITE_OTHER): Payer: Self-pay | Admitting: Bariatrics

## 2020-01-05 ENCOUNTER — Ambulatory Visit (INDEPENDENT_AMBULATORY_CARE_PROVIDER_SITE_OTHER): Payer: Medicare Other | Admitting: Bariatrics

## 2020-01-05 VITALS — BP 127/83 | HR 91 | Temp 98.8°F | Ht 65.0 in | Wt 285.0 lb

## 2020-01-05 DIAGNOSIS — Z6841 Body Mass Index (BMI) 40.0 and over, adult: Secondary | ICD-10-CM | POA: Diagnosis not present

## 2020-01-05 DIAGNOSIS — E782 Mixed hyperlipidemia: Secondary | ICD-10-CM | POA: Diagnosis not present

## 2020-01-05 DIAGNOSIS — E559 Vitamin D deficiency, unspecified: Secondary | ICD-10-CM | POA: Diagnosis not present

## 2020-01-05 DIAGNOSIS — E8881 Metabolic syndrome: Secondary | ICD-10-CM

## 2020-01-05 MED ORDER — VITAMIN D (ERGOCALCIFEROL) 1.25 MG (50000 UNIT) PO CAPS: 50000.0000 [IU] | ORAL_CAPSULE | ORAL | 0 refills | Status: DC

## 2020-01-06 ENCOUNTER — Encounter (INDEPENDENT_AMBULATORY_CARE_PROVIDER_SITE_OTHER): Payer: Self-pay | Admitting: Bariatrics

## 2020-01-06 NOTE — Progress Notes (Signed)
2 Chief Complaint:   OBESITY Jackie Montoya is here to discuss her progress with her obesity treatment plan along with follow-up of her obesity related diagnoses. Jackie Montoya is on the Category 3 Plan and states she is following her eating plan approximately 90% of the time. Jackie Montoya states she is exercising 0 minutes 0 times per week.  Today's visit was #: 2 Starting weight: 287 lbs Starting date: 12/22/2019 Today's weight: 285 lbs Today's date: 01/05/2020 Total lbs lost to date: 2 Total lbs lost since last in-office visit: 2  Interim History: Jackie Montoya is down 2 lbs. She does not like meat that much and the plan is bland according to her. She states that the plan is hard to follow.  Subjective:   Vitamin D deficiency. Jackie Montoya is not taking Vitamin D. Last Vitamin D level was 29.7 on 12/22/2019.  Insulin resistance. Jackie Montoya has a diagnosis of insulin resistance based on her elevated fasting insulin level >5. She continues to work on diet and exercise to decrease her risk of diabetes.  Lab Results  Component Value Date   INSULIN 11.7 12/22/2019   Lab Results  Component Value Date   HGBA1C 5.5 12/22/2019   Mixed hyperlipidemia. Jackie Montoya is taking Lipitor. Levels are not at goal.  Lab Results  Component Value Date   CHOL 206 (H) 12/22/2019   HDL 77 12/22/2019   LDLCALC 109 (H) 12/22/2019   TRIG 116 12/22/2019   Lab Results  Component Value Date   ALT 20 12/22/2019   AST 21 12/22/2019   ALKPHOS 126 (H) 12/22/2019   BILITOT 0.6 12/22/2019   The 10-year ASCVD risk score [name] DC Jr., et al., 2013) is: 9.3%   Values used to calculate the score:     Age: 69 years     Sex: Female     Is Non-Hispanic African American: No     Diabetic: No     Tobacco smoker: No     Systolic Blood Pressure: [id number] mmHg     Is BP treated: Yes     HDL Cholesterol: 77 mg/dL     Total Cholesterol: 206 mg/dL  Assessment/Plan:   Vitamin D deficiency. Low Vitamin D level contributes to fatigue and are  associated with obesity, breast, and colon cancer. She agrees to start prescription Vitamin D @50 ,000 IU every week #4 with 0 refills and will follow-up for routine testing of Vitamin D, at least 2-3 times per year to avoid over-replacement.  Insulin resistance.  Jackie Montoya will continue to work on weight loss, exercise, and decreasing simple carbohydrates to help decrease the risk of diabetes. Jackie Montoya agreed to follow-up with [place] as directed to closely monitor her progress. She was given sheet on Insulin Resistance and Prediabetes.   Mixed hyperlipidemia. Cardiovascular risk and specific lipid/LDL goals reviewed.  We discussed several lifestyle modifications today and Jackie Montoya will continue to work on diet, exercise and weight loss efforts. Orders and follow up as documented in patient record. She will continue medications and will decrease saturated fats.  Counseling Intensive lifestyle modifications are the first line treatment for this issue. . Dietary changes: Increase soluble fiber. Decrease simple carbohydrates. . Exercise changes: Moderate to vigorous-intensity aerobic activity 150 minutes per week if tolerated. Lipid-lowering medications: see documented in medical record.  Class 3 severe obesity with serious comorbidity and body mass index (BMI) of 45.0 to 49.9 in adult, unspecified obesity type (Jackie Montoya).  Jackie Montoya is currently in the action stage of change. As such, her goal is  to continue with weight loss efforts. She has agreed to the Category 3 Plan.   She will work on meal planning, intentional eating, and will add in protein powder.  Exercise goals: Older adults should follow the adult guidelines. When older adults cannot meet the adult guidelines, they should be as physically active as their abilities and conditions will allow.  Older adults should do exercises that maintain or improve balance if they are at risk of falling.   Behavioral modification strategies: increasing lean protein intake,  decreasing simple carbohydrates, increasing vegetables, increasing water intake, decreasing eating out, no skipping meals, meal planning and cooking strategies, keeping healthy foods in the home, dealing with family or coworker sabotage, travel eating strategies, holiday eating strategies  and celebration eating strategies.  Jackie Montoya has agreed to follow-up with our clinic in 2 weeks. She was informed of the importance of frequent follow-up visits to maximize her success with intensive lifestyle modifications for her multiple health conditions.   Objective:   Blood pressure 127/83, pulse 91, temperature 98.8 F (37.1 C), height 5\' 5"  (1.651 m), weight 285 lb (129.3 kg), last menstrual period 06/15/2002, SpO2 95 %. Body mass index is 47.43 kg/m.  General: Cooperative, alert, well developed, in no acute distress. HEENT: Conjunctivae and lids unremarkable. Cardiovascular: Regular rhythm.  Lungs: Normal work of breathing. Neurologic: No focal deficits.   Lab Results  Component Value Date   CREATININE 0.74 12/22/2019   BUN 14 12/22/2019   NA 138 12/22/2019   K 4.0 12/22/2019   CL 97 12/22/2019   CO2 24 12/22/2019   Lab Results  Component Value Date   ALT 20 12/22/2019   AST 21 12/22/2019   ALKPHOS 126 (H) 12/22/2019   BILITOT 0.6 12/22/2019   Lab Results  Component Value Date   HGBA1C 5.5 12/22/2019   Lab Results  Component Value Date   INSULIN 11.7 12/22/2019   Lab Results  Component Value Date   TSH 1.870 12/22/2019   Lab Results  Component Value Date   CHOL 206 (H) 12/22/2019   HDL 77 12/22/2019   LDLCALC 109 (H) 12/22/2019   TRIG 116 12/22/2019   Lab Results  Component Value Date   WBC 7.0 11/29/2008   HGB 15.0 11/29/2008   HCT 44.7 11/29/2008   MCV 89.4 11/29/2008   PLT 242 11/29/2008   No results found for: IRON, TIBC, FERRITIN  Obesity Behavioral Intervention Documentation for Insurance:   Approximately 15 minutes were spent on the discussion  below.  ASK: We discussed the diagnosis of obesity with Jackie Montoya today and Jackie Montoya agreed to give [place] permission to discuss obesity behavioral modification therapy today.  ASSESS: Jackie Montoya has the diagnosis of obesity and her BMI today is 47.6. Jackie Montoya is in the action stage of change.   ADVISE: Jackie Montoya was educated on the multiple health risks of obesity as well as the benefit of weight loss to improve her health. She was advised of the need for long term treatment and the importance of lifestyle modifications to improve her current health and to decrease her risk of future health problems.  AGREE: Multiple dietary modification options and treatment options were discussed and Lala agreed to follow the recommendations documented in the above note.  ARRANGE: Jackie Montoya was educated on the importance of frequent visits to treat obesity as outlined per CMS and USPSTF guidelines and agreed to schedule her next follow up appointment today.  Attestation Statements:   Reviewed by clinician on day of visit: allergies, medications, problem  list, medical history, surgical history, family history, social history, and previous encounter notes.  Migdalia Dk, am acting as Location manager for [employer], DO   I have reviewed the above documentation for accuracy and completeness, and I agree with the above. Jearld Lesch, DO

## 2020-01-10 ENCOUNTER — Other Ambulatory Visit: Payer: Self-pay | Admitting: Family Medicine

## 2020-01-10 DIAGNOSIS — Z1231 Encounter for screening mammogram for malignant neoplasm of breast: Secondary | ICD-10-CM

## 2020-01-11 DIAGNOSIS — I10 Essential (primary) hypertension: Secondary | ICD-10-CM | POA: Diagnosis not present

## 2020-01-11 DIAGNOSIS — E782 Mixed hyperlipidemia: Secondary | ICD-10-CM | POA: Diagnosis not present

## 2020-01-11 DIAGNOSIS — Z6841 Body Mass Index (BMI) 40.0 and over, adult: Secondary | ICD-10-CM | POA: Diagnosis not present

## 2020-01-11 DIAGNOSIS — R7303 Prediabetes: Secondary | ICD-10-CM | POA: Diagnosis not present

## 2020-01-12 DIAGNOSIS — G4733 Obstructive sleep apnea (adult) (pediatric): Secondary | ICD-10-CM | POA: Diagnosis not present

## 2020-01-19 ENCOUNTER — Other Ambulatory Visit: Payer: Self-pay

## 2020-01-19 ENCOUNTER — Ambulatory Visit (INDEPENDENT_AMBULATORY_CARE_PROVIDER_SITE_OTHER): Payer: Medicare Other | Admitting: Family Medicine

## 2020-01-19 ENCOUNTER — Encounter (INDEPENDENT_AMBULATORY_CARE_PROVIDER_SITE_OTHER): Payer: Self-pay | Admitting: Family Medicine

## 2020-01-19 VITALS — BP 127/69 | HR 77 | Temp 98.4°F | Ht 65.0 in | Wt 279.0 lb

## 2020-01-19 DIAGNOSIS — Z6841 Body Mass Index (BMI) 40.0 and over, adult: Secondary | ICD-10-CM

## 2020-01-19 DIAGNOSIS — E559 Vitamin D deficiency, unspecified: Secondary | ICD-10-CM | POA: Diagnosis not present

## 2020-01-20 ENCOUNTER — Encounter (INDEPENDENT_AMBULATORY_CARE_PROVIDER_SITE_OTHER): Payer: Self-pay | Admitting: Family Medicine

## 2020-01-20 NOTE — Progress Notes (Signed)
Chief Complaint:   OBESITY Jackie Montoya is here to discuss her progress with her obesity treatment plan along with follow-up of her obesity related diagnoses. Denelda is on the Category 3 Plan and keeping a food journal of 1500 to 1600 calories and 30 grams of protein daily plan and states she is following her eating plan approximately 50% of the time. Lala states she is riding the exercise bike 10 minutes 1 time per week.  Today's visit was #: 3 Starting weight: 287 lbs Starting date: 12/22/2019 Today's weight: 279 lbs Today's date: 01/19/2020 Total lbs lost to date: 8 Total lbs lost since last in-office visit: 6  Interim History: Seela has been journaling the past few weeks and she likes it much better that the Category 3 plan. Karenna denies excessive hunger.  She is having her first COVID vaccine tomorrow.  Subjective:   Vitamin D deficiency Blaire's last Vitamin D level was 29.7 on 12/22/2019 and was not at goal. [name] reports more energy with vitamin D replacement. She is on prescription vit D.   Assessment/Plan:   Vitamin D deficiency Low Vitamin D level contributes to fatigue and are associated with obesity, breast, and colon cancer. Aqsa will continue to take prescription Vitamin D @50 ,000 IU every week and she will follow-up for routine testing of Vitamin D, at least 2-3 times per year to avoid over-replacement.  Class 3 severe obesity with serious comorbidity and body mass index (BMI) of 45.0 to 49.9 in adult, unspecified obesity type (HCC) Aphrodite is currently in the action stage of change. As such, her goal is to continue with weight loss efforts. She has agreed to keeping a food journal and adhering to recommended goals of 1500 to 1600 calories and 90 to 100 grams of protein daily.   Exercise goals: Royale will continue her current exercise regimen.  Behavioral modification strategies: increasing lean protein intake and keeping a strict food journal.  Shantasia has agreed to  follow-up with our clinic in 2 weeks. She was informed of the importance of frequent follow-up visits to maximize her success with intensive lifestyle modifications for her multiple health conditions.   Objective:   Blood pressure 127/69, pulse 77, temperature 98.4 F (36.9 C), temperature source Oral, height 5\' 5"  (1.651 m), weight 279 lb (126.6 kg), last menstrual period 06/15/2002, SpO2 98 %. Body mass index is 46.43 kg/m.  General: Cooperative, alert, well developed, in no acute distress. HEENT: Conjunctivae and lids unremarkable. Cardiovascular: Regular rhythm.  Lungs: Normal work of breathing. Neurologic: No focal deficits.   Lab Results  Component Value Date   CREATININE 0.74 12/22/2019   BUN 14 12/22/2019   NA 138 12/22/2019   K 4.0 12/22/2019   CL 97 12/22/2019   CO2 24 12/22/2019   Lab Results  Component Value Date   ALT 20 12/22/2019   AST 21 12/22/2019   ALKPHOS 126 (H) 12/22/2019   BILITOT 0.6 12/22/2019   Lab Results  Component Value Date   HGBA1C 5.5 12/22/2019   Lab Results  Component Value Date   INSULIN 11.7 12/22/2019   Lab Results  Component Value Date   TSH 1.870 12/22/2019   Lab Results  Component Value Date   CHOL 206 (H) 12/22/2019   HDL 77 12/22/2019   LDLCALC 109 (H) 12/22/2019   TRIG 116 12/22/2019   Lab Results  Component Value Date   WBC 7.0 11/29/2008   HGB 15.0 11/29/2008   HCT 44.7 11/29/2008  MCV 89.4 11/29/2008   PLT 242 11/29/2008   No results found for: IRON, TIBC, FERRITIN   Ref. Range 12/22/2019 11:53  Vitamin D, 25-Hydroxy Latest Ref Range: 30.0 - 100.0 ng/mL 29.7 (L)   Obesity Behavioral Intervention Documentation for Insurance:   Approximately 15 minutes were spent on the discussion below.  ASK: We discussed the diagnosis of obesity with [name] today and Elean agreed to give [place] permission to discuss obesity behavioral modification therapy today.  ASSESS: Yolande has the diagnosis of obesity and her BMI today  is 46.43. Chlorene is in the action stage of change.   ADVISE: Madgelene was educated on the multiple health risks of obesity as well as the benefit of weight loss to improve her health. She was advised of the need for long term treatment and the importance of lifestyle modifications to improve her current health and to decrease her risk of future health problems.  AGREE: Multiple dietary modification options and treatment options were discussed and Danaja agreed to follow the recommendations documented in the above note.  ARRANGE: Hortense was educated on the importance of frequent visits to treat obesity as outlined per CMS and USPSTF guidelines and agreed to schedule her next follow up appointment today.  Attestation Statements:   Reviewed by clinician on day of visit: allergies, medications, problem list, medical history, surgical history, family history, social history, and previous encounter notes.  Corey Skains, am acting as Location manager for [employer], FNP-C.  I have reviewed the above documentation for accuracy and completeness, and I agree with the above. -  Charan Prieto [employer], FNP-C

## 2020-02-02 ENCOUNTER — Other Ambulatory Visit: Payer: Self-pay

## 2020-02-02 ENCOUNTER — Ambulatory Visit (INDEPENDENT_AMBULATORY_CARE_PROVIDER_SITE_OTHER): Payer: Medicare Other | Admitting: Family Medicine

## 2020-02-02 ENCOUNTER — Encounter (INDEPENDENT_AMBULATORY_CARE_PROVIDER_SITE_OTHER): Payer: Self-pay | Admitting: Family Medicine

## 2020-02-02 VITALS — BP 148/79 | HR 80 | Temp 98.1°F | Ht 65.0 in | Wt 278.0 lb

## 2020-02-02 DIAGNOSIS — E8881 Metabolic syndrome: Secondary | ICD-10-CM | POA: Diagnosis not present

## 2020-02-02 DIAGNOSIS — E559 Vitamin D deficiency, unspecified: Secondary | ICD-10-CM

## 2020-02-02 DIAGNOSIS — Z6841 Body Mass Index (BMI) 40.0 and over, adult: Secondary | ICD-10-CM | POA: Diagnosis not present

## 2020-02-02 MED ORDER — VITAMIN D (ERGOCALCIFEROL) 1.25 MG (50000 UNIT) PO CAPS: 50000.0000 [IU] | ORAL_CAPSULE | ORAL | 0 refills | Status: DC

## 2020-02-07 ENCOUNTER — Encounter (INDEPENDENT_AMBULATORY_CARE_PROVIDER_SITE_OTHER): Payer: Self-pay | Admitting: Family Medicine

## 2020-02-07 NOTE — Progress Notes (Signed)
Chief Complaint:   OBESITY Jackie Montoya is here to discuss her progress with her obesity treatment plan along with follow-up of her obesity related diagnoses. Jackie Montoya is keeping a food journal and adhering to recommended goals of 1550 calories and 70 grams of protein and states she is following her eating plan approximately 90-95% of the time. Jackie Montoya states she is exercising 0 minutes 0 times per week.  Today's visit was #: 4 Starting weight: 287 lbs Starting date: 12/22/2019 Today's weight: 278 lbs Today's date: 02/02/2020 Total lbs lost to date: 9 Total lbs lost since last in-office visit: 1  Interim History: Jackie Montoya has not been keeping track of protein because she has not downloaded My Fitness Pal. She has been keeping calories and trying to keep calories at around 1500/day. She reports occasional polyphagia.  Subjective:   Vitamin D deficiency.  Last Vitamin D level low at 29.7 on 12/22/2019. Jackie Montoya is on prescription Vitamin D.  Insulin resistance. Jackie Montoya has a diagnosis of insulin resistance based on her elevated fasting insulin level >5. She continues to work on diet and exercise to decrease her risk of diabetes. Jackie Montoya reports mild occasional polyphagia. She is not on metformin.  Lab Results  Component Value Date   INSULIN 11.7 12/22/2019   Lab Results  Component Value Date   HGBA1C 5.5 12/22/2019   Assessment/Plan:   Vitamin D deficiency. Low Vitamin D level contributes to fatigue and are associated with obesity, breast, and colon cancer. She was given a refill on her Vitamin D, Ergocalciferol, (DRISDOL) 1.25 MG (50000 UNIT) CAPS capsule every week #4 with 0 refills and will follow-up for routine testing of Vitamin D, at least 2-3 times per year to avoid over-replacement.   Insulin resistance. Jackie Montoya will continue to work on weight loss, exercise, and decreasing simple carbohydrates to help decrease the risk of diabetes. Jackie Montoya agreed to follow-up with [place] as directed to  closely monitor her progress. She will continue her meal plan as directed.  Class 3 severe obesity with serious comorbidity and body mass index (BMI) of 45.0 to 49.9 in adult, unspecified obesity type ([place]).  Jackie Montoya is currently in the action stage of change. As such, her goal is to continue with weight loss efforts. She has agreed to keeping a food journal and adhering to recommended goals of 1500-1600 calories and 90-100 grams of protein daily.   I demonstrated My Fitness Pal.   Exercise goals: No exercise has been prescribed at this time.  Behavioral modification strategies: increasing lean protein intake, decreasing simple carbohydrates and keeping a strict food journal.  Jackie Montoya has agreed to follow-up with our clinic in 2 weeks. She was informed of the importance of frequent follow-up visits to maximize her success with intensive lifestyle modifications for her multiple health conditions.   Objective:   Blood pressure (!) 148/79, pulse 80, temperature 98.1 F (36.7 C), temperature source Oral, height 5\' 5"  (1.651 m), weight 278 lb (126.1 kg), last menstrual period 06/15/2002, SpO2 97 %. Body mass index is 46.26 kg/m.  General: Cooperative, alert, well developed, in no acute distress. HEENT: Conjunctivae and lids unremarkable. Cardiovascular: Regular rhythm.  Lungs: Normal work of breathing. Neurologic: No focal deficits.   Lab Results  Component Value Date   CREATININE 0.74 12/22/2019   BUN 14 12/22/2019   NA 138 12/22/2019   K 4.0 12/22/2019   CL 97 12/22/2019   CO2 24 12/22/2019   Lab Results  Component Value Date   ALT  20 12/22/2019   AST 21 12/22/2019   ALKPHOS 126 (H) 12/22/2019   BILITOT 0.6 12/22/2019   Lab Results  Component Value Date   HGBA1C 5.5 12/22/2019   Lab Results  Component Value Date   INSULIN 11.7 12/22/2019   Lab Results  Component Value Date   TSH 1.870 12/22/2019   Lab Results  Component Value Date   CHOL 206 (H) 12/22/2019   HDL 77  12/22/2019   LDLCALC 109 (H) 12/22/2019   TRIG 116 12/22/2019   Lab Results  Component Value Date   WBC 7.0 11/29/2008   HGB 15.0 11/29/2008   HCT 44.7 11/29/2008   MCV 89.4 11/29/2008   PLT 242 11/29/2008   No results found for: IRON, TIBC, FERRITIN  Obesity Behavioral Intervention Documentation for Insurance:   Approximately 15 minutes were spent on the discussion below.  ASK: We discussed the diagnosis of obesity with Jackie Montoya today and Jackie Montoya agreed to give [place] permission to discuss obesity behavioral modification therapy today.  ASSESS: Jackie Montoya has the diagnosis of obesity and her BMI today is 46.4. Jackie Montoya is in the action stage of change.   ADVISE: Jackie Montoya was educated on the multiple health risks of obesity as well as the benefit of weight loss to improve her health. She was advised of the need for long term treatment and the importance of lifestyle modifications to improve her current health and to decrease her risk of future health problems.  AGREE: Multiple dietary modification options and treatment options were discussed and Jackie Montoya agreed to follow the recommendations documented in the above note.  ARRANGE: Jackie Montoya was educated on the importance of frequent visits to treat obesity as outlined per CMS and USPSTF guidelines and agreed to schedule her next follow up appointment today.  Attestation Statements:   Reviewed by clinician on day of visit: allergies, medications, problem list, medical history, surgical history, family history, social history, and previous encounter notes.  IMichaelene Song, am acting as Location manager for [employer], FNP   I have reviewed the above documentation for accuracy and completeness, and I agree with the above. -  Georgianne Fick, FNP

## 2020-02-11 DIAGNOSIS — H5211 Myopia, right eye: Secondary | ICD-10-CM | POA: Diagnosis not present

## 2020-02-11 DIAGNOSIS — H16203 Unspecified keratoconjunctivitis, bilateral: Secondary | ICD-10-CM | POA: Diagnosis not present

## 2020-02-11 DIAGNOSIS — H25811 Combined forms of age-related cataract, right eye: Secondary | ICD-10-CM | POA: Diagnosis not present

## 2020-02-11 DIAGNOSIS — H04123 Dry eye syndrome of bilateral lacrimal glands: Secondary | ICD-10-CM | POA: Diagnosis not present

## 2020-02-15 ENCOUNTER — Ambulatory Visit
Admission: RE | Admit: 2020-02-15 | Discharge: 2020-02-15 | Disposition: A | Discharge status: Home / Self Care | Payer: Medicare Other | Source: Ambulatory Visit | Attending: Family Medicine | Admitting: Family Medicine

## 2020-02-15 ENCOUNTER — Ambulatory Visit: Payer: Medicare Other

## 2020-02-15 ENCOUNTER — Other Ambulatory Visit: Payer: Self-pay | Admitting: General Practice

## 2020-02-15 ENCOUNTER — Other Ambulatory Visit: Payer: Self-pay

## 2020-02-15 DIAGNOSIS — Z1231 Encounter for screening mammogram for malignant neoplasm of breast: Secondary | ICD-10-CM

## 2020-02-17 ENCOUNTER — Encounter (INDEPENDENT_AMBULATORY_CARE_PROVIDER_SITE_OTHER): Payer: Self-pay | Admitting: Family Medicine

## 2020-02-17 ENCOUNTER — Ambulatory Visit (INDEPENDENT_AMBULATORY_CARE_PROVIDER_SITE_OTHER): Payer: Medicare Other | Admitting: Family Medicine

## 2020-02-17 ENCOUNTER — Other Ambulatory Visit: Payer: Self-pay

## 2020-02-17 VITALS — BP 133/79 | HR 89 | Temp 98.4°F | Ht 65.0 in | Wt 272.0 lb

## 2020-02-17 DIAGNOSIS — I10 Essential (primary) hypertension: Secondary | ICD-10-CM

## 2020-02-17 DIAGNOSIS — E8881 Metabolic syndrome: Secondary | ICD-10-CM | POA: Diagnosis not present

## 2020-02-17 DIAGNOSIS — Z6841 Body Mass Index (BMI) 40.0 and over, adult: Secondary | ICD-10-CM

## 2020-02-17 NOTE — Progress Notes (Signed)
Chief Complaint:   OBESITY Jackie Montoya is here to discuss her progress with her obesity treatment plan along with follow-up of her obesity related diagnoses. Jackie Montoya is keeping a food journal and adhering to recommended goals of 1550 calories and 80 grams of protein and states she is following her eating plan approximately 95% of the time. Jackie Montoya states she is exercising 0 minutes 0 times per week.  Today's visit was #: 5 Starting weight: 287 lbs Starting date: 12/22/2019 Today's weight: 272 lbs Today's date: 02/17/2020 Total lbs lost to date: 15 Total lbs lost since last in-office visit: 6  Interim History: Jackie Montoya is journaling consistently and meeting her calorie and protein goals. She has cut down on butter and sugar. She has cut out Cokes which she reports she was addicted to. She is now monitoring her protein with My Fitness Pal.  Subjective:   Essential hypertension. Blood pressure is well controlled. Jackie Montoya is taking Norvasc, HCTZ, and losartan.  BP Readings from Last 3 Encounters:  02/17/20 133/79  02/02/20 (!) 148/79  01/19/20 127/69   Lab Results  Component Value Date   CREATININE 0.74 12/22/2019   CREATININE 0.65 11/29/2008   Insulin resistance. Jackie Montoya has a diagnosis of insulin resistance based on her elevated fasting insulin level >5. She continues to work on diet and exercise to decrease her risk of diabetes. She is not on metformin. No polyphagia.  Lab Results  Component Value Date   INSULIN 11.7 12/22/2019   Lab Results  Component Value Date   HGBA1C 5.5 12/22/2019    Assessment/Plan:   Essential hypertension. Jackie Montoya is working on healthy weight loss and exercise to improve blood pressure control. We will watch for signs of hypotension as she continues her lifestyle modifications. She will continue all medications as directed.  Insulin resistance. Jackie Montoya will continue to work on weight loss, exercise, and decreasing simple carbohydrates to help decrease  the risk of diabetes. Jackie Montoya agreed to follow-up with [place] as directed to closely monitor her progress. She will continue her meal plan.  Class 3 severe obesity with serious comorbidity and body mass index (BMI) of 45.0 to 49.9 in adult, unspecified obesity type (Jackie Montoya).  Jackie Montoya is currently in the action stage of change. As such, her goal is to continue with weight loss efforts. She has agreed to keeping a food journal and adhering to recommended goals of 1500-1600 calories and 90-100 grams of protein daily.   Exercise goals: Jackie Montoya will ride stationary bike 10 minutes 2-3 times a week.  Behavioral modification strategies: increasing vegetables, planning for success and keeping a strict food journal.  Jackie Montoya has agreed to follow-up with our clinic in 3 weeks. She was informed of the importance of frequent follow-up visits to maximize her success with intensive lifestyle modifications for her multiple health conditions.   Objective:   Blood pressure 133/79, pulse 89, temperature 98.4 F (36.9 C), temperature source Oral, height 5\' 5"  (1.651 m), weight 272 lb (123.4 kg), last menstrual period 06/15/2002, SpO2 96 %. Body mass index is 45.26 kg/m.  General: Cooperative, alert, well developed, in no acute distress. HEENT: Conjunctivae and lids unremarkable. Cardiovascular: Regular rhythm.  Lungs: Normal work of breathing. Neurologic: No focal deficits.   Lab Results  Component Value Date   CREATININE 0.74 12/22/2019   BUN 14 12/22/2019   NA 138 12/22/2019   K 4.0 12/22/2019   CL 97 12/22/2019   CO2 24 12/22/2019   Lab Results  Component Value Date  ALT 20 12/22/2019   AST 21 12/22/2019   ALKPHOS 126 (H) 12/22/2019   BILITOT 0.6 12/22/2019   Lab Results  Component Value Date   HGBA1C 5.5 12/22/2019   Lab Results  Component Value Date   INSULIN 11.7 12/22/2019   Lab Results  Component Value Date   TSH 1.870 12/22/2019   Lab Results  Component Value Date   CHOL 206 (H)  12/22/2019   HDL 77 12/22/2019   LDLCALC 109 (H) 12/22/2019   TRIG 116 12/22/2019   Lab Results  Component Value Date   WBC 7.0 11/29/2008   HGB 15.0 11/29/2008   HCT 44.7 11/29/2008   MCV 89.4 11/29/2008   PLT 242 11/29/2008   No results found for: IRON, TIBC, FERRITIN  Obesity Behavioral Intervention Documentation for Insurance:   Approximately 15 minutes were spent on the discussion below.  ASK: We discussed the diagnosis of obesity with Jackie Montoya today and Jackie Montoya agreed to give [place] permission to discuss obesity behavioral modification therapy today.  ASSESS: Jackie Montoya has the diagnosis of obesity and her BMI today is 45.4. Jackie Montoya is in the action stage of change.   ADVISE: Jackie Montoya was educated on the multiple health risks of obesity as well as the benefit of weight loss to improve her health. She was advised of the need for long term treatment and the importance of lifestyle modifications to improve her current health and to decrease her risk of future health problems.  AGREE: Multiple dietary modification options and treatment options were discussed and Jackie Montoya agreed to follow the recommendations documented in the above note.  ARRANGE: Jackie Montoya was educated on the importance of frequent visits to treat obesity as outlined per CMS and USPSTF guidelines and agreed to schedule her next follow up appointment today.  Attestation Statements:   Reviewed by clinician on day of visit: allergies, medications, problem list, medical history, surgical history, family history, social history, and previous encounter notes.  IMichaelene Song, am acting as Location manager for [employer], FNP   I have reviewed the above documentation for accuracy and completeness, and I agree with the above. -  Georgianne Fick, FNP

## 2020-02-21 ENCOUNTER — Encounter (INDEPENDENT_AMBULATORY_CARE_PROVIDER_SITE_OTHER): Payer: Self-pay | Admitting: Family Medicine

## 2020-03-09 ENCOUNTER — Ambulatory Visit (INDEPENDENT_AMBULATORY_CARE_PROVIDER_SITE_OTHER): Payer: Medicare Other | Admitting: Family Medicine

## 2020-03-09 ENCOUNTER — Encounter (INDEPENDENT_AMBULATORY_CARE_PROVIDER_SITE_OTHER): Payer: Self-pay | Admitting: Family Medicine

## 2020-03-09 ENCOUNTER — Other Ambulatory Visit: Payer: Self-pay

## 2020-03-09 VITALS — BP 114/72 | HR 80 | Temp 98.1°F | Ht 65.0 in | Wt 268.0 lb

## 2020-03-09 DIAGNOSIS — E8881 Metabolic syndrome: Secondary | ICD-10-CM | POA: Diagnosis not present

## 2020-03-09 DIAGNOSIS — E88819 Insulin resistance, unspecified: Secondary | ICD-10-CM

## 2020-03-09 DIAGNOSIS — Z6841 Body Mass Index (BMI) 40.0 and over, adult: Secondary | ICD-10-CM

## 2020-03-09 DIAGNOSIS — E559 Vitamin D deficiency, unspecified: Secondary | ICD-10-CM

## 2020-03-09 MED ORDER — VITAMIN D (ERGOCALCIFEROL) 1.25 MG (50000 UNIT) PO CAPS: 50000.0000 [IU] | ORAL_CAPSULE | ORAL | 0 refills | Status: DC

## 2020-03-13 ENCOUNTER — Encounter (INDEPENDENT_AMBULATORY_CARE_PROVIDER_SITE_OTHER): Payer: Self-pay | Admitting: Family Medicine

## 2020-03-13 NOTE — Progress Notes (Signed)
Chief Complaint:   OBESITY Jackie Montoya is here to discuss her progress with her obesity treatment plan along with follow-up of her obesity related diagnoses. Tomie is on keeping a food journal and adhering to recommended goals of 1500-1600 calories and 90-100 grams of protein daily and states she is following her eating plan approximately 90% of the time. Chanielle states she is riding the stationary bike and gardening for 30-60 minutes 1-2 times per week.  Today's visit was #: 6 Starting weight: 287 lbs Starting date: 12/22/2019 Today's weight: 268 lbs Today's date: 03/09/2020 Total lbs lost to date: 19 Total lbs lost since last in-office visit: 4  Interim History: Jackie Montoya is a bit deficient in protein intake, but she is meeting her calorie goals.  Subjective:   1. Vitamin D deficiency Jackie Montoya's Vit D level is not at goal. Last Vit D level was 29.7 on 12/22/2019. She is on prescription Vit D.  2. Insulin resistance Jackie Montoya denies polyphagia. She notes cravings for chocolate. Lab Results  Component Value Date   HGBA1C 5.5 12/22/2019    Assessment/Plan:   1. Vitamin D deficiency Low Vitamin D level contributes to fatigue and are associated with obesity, breast, and colon cancer. We will refill prescription Vitamin D for 1 month. Jackie Montoya will follow-up for routine testing of Vitamin D, at least 2-3 times per year to avoid over-replacement.  - Vitamin D, Ergocalciferol, (DRISDOL) 1.25 MG (50000 UNIT) CAPS capsule; Take 1 capsule (50,000 Units total) by mouth every 7 (seven) days.  Dispense: 4 capsule; Refill: 0  2. Insulin resistance Jackie Montoya will continue her meal plan, and will continue to work on weight loss, exercise, and decreasing simple carbohydrates to help decrease the risk of diabetes. Jackie Montoya agreed to follow-up with [place] as directed to closely monitor her progress.  3. Class 3 severe obesity with serious comorbidity and body mass index (BMI) of 40.0 to 44.9 in adult, unspecified obesity type  ([facility]) Jackie Montoya is currently in the action stage of change. As such, her goal is to continue with weight loss efforts. She has agreed to keeping a food journal and adhering to recommended goals of 1500-1600 calories and 90-100 grams of protein daily.   Exercise goals: As is.  Behavioral modification strategies: increasing lean protein intake, meal planning and cooking strategies, better snacking choices and planning for success.  Jackie Montoya has agreed to follow-up with our clinic in 2 weeks. She was informed of the importance of frequent follow-up visits to maximize her success with intensive lifestyle modifications for her multiple health conditions.   Objective:   Blood pressure 114/72, pulse 80, temperature 98.1 F (36.7 C), temperature source Oral, height 5\' 5"  (1.651 m), weight 268 lb (121.6 kg), last menstrual period 06/15/2002, SpO2 98 %. Body mass index is 44.6 kg/m.  General: Cooperative, alert, well developed, in no acute distress. HEENT: Conjunctivae and lids unremarkable. Cardiovascular: Regular rhythm.  Lungs: Normal work of breathing. Neurologic: No focal deficits.   Lab Results  Component Value Date   CREATININE 0.74 12/22/2019   BUN 14 12/22/2019   NA 138 12/22/2019   K 4.0 12/22/2019   CL 97 12/22/2019   CO2 24 12/22/2019   Lab Results  Component Value Date   ALT 20 12/22/2019   AST 21 12/22/2019   ALKPHOS 126 (H) 12/22/2019   BILITOT 0.6 12/22/2019   Lab Results  Component Value Date   HGBA1C 5.5 12/22/2019   Lab Results  Component Value Date   INSULIN 11.7 12/22/2019  Lab Results  Component Value Date   TSH 1.870 12/22/2019   Lab Results  Component Value Date   CHOL 206 (H) 12/22/2019   HDL 77 12/22/2019   LDLCALC 109 (H) 12/22/2019   TRIG 116 12/22/2019   Lab Results  Component Value Date   WBC 7.0 11/29/2008   HGB 15.0 11/29/2008   HCT 44.7 11/29/2008   MCV 89.4 11/29/2008   PLT 242 11/29/2008   No results found for: IRON, TIBC,  FERRITIN  Obesity Behavioral Intervention Documentation for Insurance:   Approximately 15 minutes were spent on the discussion below.  ASK: We discussed the diagnosis of obesity with [name] today and Kallan agreed to give [place] permission to discuss obesity behavioral modification therapy today.  ASSESS: Breiona has the diagnosis of obesity and her BMI today is 44.6. Celisse is in the action stage of change.   ADVISE: Naomee was educated on the multiple health risks of obesity as well as the benefit of weight loss to improve her health. She was advised of the need for long term treatment and the importance of lifestyle modifications to improve her current health and to decrease her risk of future health problems.  AGREE: Multiple dietary modification options and treatment options were discussed and Jonnita agreed to follow the recommendations documented in the above note.  ARRANGE: Annajo was educated on the importance of frequent visits to treat obesity as outlined per CMS and USPSTF guidelines and agreed to schedule her next follow up appointment today.  Attestation Statements:   Reviewed by clinician on day of visit: allergies, medications, problem list, medical history, surgical history, family history, social history, and previous encounter notes.   [name], am acting as Location manager for [employer], FNP-C.  I have reviewed the above documentation for accuracy and completeness, and I agree with the above. -  Georgianne Fick, FNP

## 2020-03-27 ENCOUNTER — Ambulatory Visit (INDEPENDENT_AMBULATORY_CARE_PROVIDER_SITE_OTHER): Payer: Medicare Other | Admitting: Family Medicine

## 2020-03-30 ENCOUNTER — Ambulatory Visit (INDEPENDENT_AMBULATORY_CARE_PROVIDER_SITE_OTHER): Payer: Medicare Other | Admitting: Family Medicine

## 2020-03-30 ENCOUNTER — Other Ambulatory Visit: Payer: Self-pay

## 2020-03-30 ENCOUNTER — Encounter (INDEPENDENT_AMBULATORY_CARE_PROVIDER_SITE_OTHER): Payer: Self-pay | Admitting: Family Medicine

## 2020-03-30 VITALS — BP 158/84 | HR 87 | Temp 97.3°F | Ht 65.0 in | Wt 268.0 lb

## 2020-03-30 DIAGNOSIS — E559 Vitamin D deficiency, unspecified: Secondary | ICD-10-CM

## 2020-03-30 DIAGNOSIS — Z6841 Body Mass Index (BMI) 40.0 and over, adult: Secondary | ICD-10-CM

## 2020-03-30 DIAGNOSIS — I10 Essential (primary) hypertension: Secondary | ICD-10-CM

## 2020-03-30 MED ORDER — VITAMIN D (ERGOCALCIFEROL) 1.25 MG (50000 UNIT) PO CAPS: 50000.0000 [IU] | ORAL_CAPSULE | ORAL | 0 refills | Status: DC

## 2020-04-03 ENCOUNTER — Encounter (INDEPENDENT_AMBULATORY_CARE_PROVIDER_SITE_OTHER): Payer: Self-pay | Admitting: Family Medicine

## 2020-04-03 NOTE — Progress Notes (Signed)
Chief Complaint:   OBESITY Jackie Montoya is here to discuss her progress with her obesity treatment plan along with follow-up of her obesity related diagnoses. Jackie Montoya is on keeping a food journal and adhering to recommended goals of 1500-1600 calories and 90-100 grams of protein daily and states she is following her eating plan approximately 70% of the time. Jackie Montoya states she is riding the bike, and doing yard work for 5 minutes 2 times per week.  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 She admits to overeating and also skipping meals at times. She is not yet back on the plan.  Subjective:   1. Vitamin D deficiency Jackie Montoya's last Vit D level was low at 29.7. She is on high dose Vit D weekly.  2. Essential hypertension Jackie Montoya's blood pressure is high today, but usually well controlled. She is compliant with all of her medications. She is out of her hydrochlorothiazide, which may be why her blood pressure is elevated today. She denies chest pain or shortness of breath.  Assessment/Plan:   1. Vitamin D deficiency Low Vitamin D level contributes to fatigue and are associated with obesity, breast, and colon cancer. We will refill prescription Vitamin D for 1 month. Jackie Montoya will follow-up for routine testing of Vitamin D, at least 2-3 times per year to avoid over-replacement.  - Vitamin D, Ergocalciferol, (DRISDOL) 1.25 MG (50000 UNIT) CAPS capsule; Take 1 capsule (50,000 Units total) by mouth every 7 (seven) days.  Dispense: 4 capsule; Refill: 0  2. Essential hypertension Jackie Montoya will continue all of her medications, and she will continue working on healthy weight loss and exercise to improve blood  pressure control. We will watch for signs of hypotension as she continues her lifestyle modifications.  3. Class 3 severe obesity with serious comorbidity and body mass index (BMI) of 40.0 to 44.9 in adult, unspecified obesity type ([facility]) Jackie Montoya is currently in the action stage of change. As such, her goal is to continue with weight loss efforts. She has agreed to keeping a food journal and adhering to recommended goals of 1500-1600 calories and 90-100 grams of protein daily.   Exercise goals: As is.  Behavioral modification strategies: increasing lean protein intake, decreasing simple carbohydrates, no skipping meals and keeping a strict food journal.  Jackie Montoya has agreed to follow-up with our clinic in 2 weeks. She was informed of the importance of frequent follow-up visits to maximize her success with intensive lifestyle modifications for her multiple health conditions.   Objective:   Blood pressure (!) 158/84, pulse 87, temperature (!) 97.3 F (36.3 C), temperature source Oral, height 5\' 5"  (1.651 m), weight 268 lb (121.6 kg), last menstrual period 06/15/2002, SpO2 91 %. Body mass index is 44.6 kg/m.  General: Cooperative, alert, well developed, in no acute distress. HEENT: Conjunctivae and lids unremarkable. Cardiovascular: Regular rhythm.  Lungs: Normal work of breathing. Neurologic: No focal deficits.   Lab Results  Component Value Date   CREATININE 0.74 12/22/2019   BUN 14 12/22/2019   NA 138 12/22/2019   K 4.0 12/22/2019   CL 97 12/22/2019   CO2 24 12/22/2019   Lab Results  Component  Value Date   ALT 20 12/22/2019   AST 21 12/22/2019   ALKPHOS 126 (H) 12/22/2019   BILITOT 0.6 12/22/2019   Lab Results  Component Value Date   HGBA1C 5.5 12/22/2019   Lab Results  Component Value Date   INSULIN 11.7 12/22/2019   Lab Results  Component Value Date   TSH 1.870 12/22/2019   Lab Results  Component Value Date   CHOL 206 (H) 12/22/2019   HDL 77 12/22/2019   LDLCALC  109 (H) 12/22/2019   TRIG 116 12/22/2019   Lab Results  Component Value Date   WBC 7.0 11/29/2008   HGB 15.0 11/29/2008   HCT 44.7 11/29/2008   MCV 89.4 11/29/2008   PLT 242 11/29/2008   No results found for: IRON, TIBC, FERRITIN  Obesity Behavioral Intervention Documentation for Insurance:   Approximately 15 minutes were spent on the discussion below.  ASK: We discussed the diagnosis of obesity with Jackie Montoya today and Jackie Montoya agreed to give [place] permission to discuss obesity behavioral modification therapy today.  ASSESS: Jackie Montoya has the diagnosis of obesity and her BMI today is 44.6. Jackie Montoya is in the action stage of change.   ADVISE: Jackie Montoya was educated on the multiple health risks of obesity as well as the benefit of weight loss to improve her health. She was advised of the need for long term treatment and the importance of lifestyle modifications to improve her current health and to decrease her risk of future health problems.  AGREE: Multiple dietary modification options and treatment options were discussed and Jackie Montoya agreed to follow the recommendations documented in the above note.  ARRANGE: Jackie Montoya was educated on the importance of frequent visits to treat obesity as outlined per CMS and USPSTF guidelines and agreed to schedule her next follow up appointment today.  Attestation Statements:   Reviewed by clinician on day of visit: allergies, medications, problem list, medical history, surgical history, family history, social history, and previous encounter notes.   [name], am acting as Location manager for [employer], FNP-C.  I have reviewed the above documentation for accuracy and completeness, and I agree with the above. -  Georgianne Fick, FNP

## 2020-04-13 ENCOUNTER — Other Ambulatory Visit: Payer: Self-pay

## 2020-04-13 ENCOUNTER — Encounter (INDEPENDENT_AMBULATORY_CARE_PROVIDER_SITE_OTHER): Payer: Self-pay | Admitting: Family Medicine

## 2020-04-13 ENCOUNTER — Ambulatory Visit (INDEPENDENT_AMBULATORY_CARE_PROVIDER_SITE_OTHER): Payer: Medicare Other | Admitting: Family Medicine

## 2020-04-13 VITALS — BP 128/79 | HR 81 | Temp 98.5°F | Ht 65.0 in | Wt 261.0 lb

## 2020-04-13 DIAGNOSIS — Z6841 Body Mass Index (BMI) 40.0 and over, adult: Secondary | ICD-10-CM

## 2020-04-13 DIAGNOSIS — I10 Essential (primary) hypertension: Secondary | ICD-10-CM | POA: Diagnosis not present

## 2020-04-17 ENCOUNTER — Encounter (INDEPENDENT_AMBULATORY_CARE_PROVIDER_SITE_OTHER): Payer: Self-pay | Admitting: Family Medicine

## 2020-04-17 NOTE — Progress Notes (Signed)
Chief Complaint:   OBESITY Jackie Montoya is here to discuss her progress with her obesity treatment plan along with follow-up of her obesity related diagnoses. Jackie Montoya is on keeping a food journal and adhering to recommended goals of 1500-1600 calories and 90-100 grams of protein daily and states she is following her eating plan approximately 90% of the time. Jackie Montoya states she is doing yard work for 60 minutes 2-3 times per week.  Today's visit was #: 8 Starting weight: 287 lbs Starting date: 12/22/2019 Today's weight: 261 lbs Today's date: 04/13/2020 Total lbs lost to date: 26 Total lbs lost since last in-office visit: 7  Interim History: Jackie Montoya is down 7 lbs today. She is watching her grand nephew 2 days per week whuch keeps her busy.. She is journaling daily and is meeting her protein goals. She is grilling food at supper and notes she is eating in a healthy fashion.  Subjective:   1. Essential hypertension Jackie Montoya's blood pressure is good today, but it was high at her last visit. She is on hydrochlorothiazide, Norvasc, and losartan.  BP Readings from Last 3 Encounters:  04/13/20 128/79  03/30/20 (!) 158/84  03/09/20 114/72      Assessment/Plan:   1. Essential hypertension Jackie Montoya will continue all of her medications, and will continue to work on healthy weight loss and exercise to improve blood pressure control. We will watch for signs of hypotension as she continues her lifestyle modifications.  2. Class 3 severe obesity with serious comorbidity and body mass index (BMI) of 40.0 to 44.9 in adult, unspecified obesity type (HCC) Jackie Montoya is currently in the action stage of change. As such, her goal is to continue with weight loss efforts. She has agreed to keeping a food journal and adhering to recommended goals of 1500-1600 calories and 90-100 grams of protein daily.   Exercise goals: As is.  Behavioral modification strategies: keeping a strict food journal.  Jackie Montoya has agreed to  follow-up with our clinic in 2 to 3 weeks. She was informed of the importance of frequent follow-up visits to maximize her success with intensive lifestyle modifications for her multiple health conditions.   Objective:   Blood pressure 128/79, pulse 81, temperature 98.5 F (36.9 C), temperature source Oral, height 5\' 5"  (1.651 m), weight 261 lb (118.4 kg), last menstrual period 06/15/2002, SpO2 92 %. Body mass index is 43.43 kg/m.  General: Cooperative, alert, well developed, in no acute distress. HEENT: Conjunctivae and lids unremarkable. Cardiovascular: Regular rhythm.  Lungs: Normal work of breathing. Neurologic: No focal deficits.   Lab Results  Component Value Date   CREATININE 0.74 12/22/2019   BUN 14 12/22/2019   NA 138 12/22/2019   K 4.0 12/22/2019   CL 97 12/22/2019   CO2 24 12/22/2019   Lab Results  Component Value Date   ALT 20 12/22/2019   AST 21 12/22/2019   ALKPHOS 126 (H) 12/22/2019   BILITOT 0.6 12/22/2019   Lab Results  Component Value Date   HGBA1C 5.5 12/22/2019   Lab Results  Component Value Date   INSULIN 11.7 12/22/2019   Lab Results  Component Value Date   TSH 1.870 12/22/2019   Lab Results  Component Value Date   CHOL 206 (H) 12/22/2019   HDL 77 12/22/2019   LDLCALC 109 (H) 12/22/2019   TRIG 116 12/22/2019   Lab Results  Component Value Date   WBC 7.0 11/29/2008   HGB 15.0 11/29/2008   HCT 44.7 11/29/2008   MCV  89.4 11/29/2008   PLT 242 11/29/2008   No results found for: IRON, TIBC, FERRITIN  Obesity Behavioral Intervention Documentation for Insurance:   Approximately 15 minutes were spent on the discussion below.  ASK: We discussed the diagnosis of obesity with Jackie Montoya today and Jackie Montoya agreed to give [place] permission to discuss obesity behavioral modification therapy today.  ASSESS: Jackie Montoya has the diagnosis of obesity and her BMI today is 43.43. Jackie Montoya is in the action stage of change.   ADVISE: Jackie Montoya was educated on the multiple  health risks of obesity as well as the benefit of weight loss to improve her health. She was advised of the need for long term treatment and the importance of lifestyle modifications to improve her current health and to decrease her risk of future health problems.  AGREE: Multiple dietary modification options and treatment options were discussed and Jackie Montoya agreed to follow the recommendations documented in the above note.  ARRANGE: Jackie Montoya was educated on the importance of frequent visits to treat obesity as outlined per CMS and USPSTF guidelines and agreed to schedule her next follow up appointment today.  Attestation Statements:   Reviewed by clinician on day of visit: allergies, medications, problem list, medical history, surgical history, family history, social history, and previous encounter notes.   [name], am acting as Location manager for [employer], FNP-C.  I have reviewed the above documentation for accuracy and completeness, and I agree with the above. -  Georgianne Fick, FNP

## 2020-04-27 ENCOUNTER — Other Ambulatory Visit: Payer: Self-pay

## 2020-04-27 ENCOUNTER — Ambulatory Visit (INDEPENDENT_AMBULATORY_CARE_PROVIDER_SITE_OTHER): Payer: Medicare Other | Admitting: Family Medicine

## 2020-04-27 ENCOUNTER — Encounter (INDEPENDENT_AMBULATORY_CARE_PROVIDER_SITE_OTHER): Payer: Self-pay | Admitting: Family Medicine

## 2020-04-27 VITALS — BP 135/78 | HR 75 | Temp 98.0°F | Ht 65.0 in | Wt 260.0 lb

## 2020-04-27 DIAGNOSIS — E7849 Other hyperlipidemia: Secondary | ICD-10-CM | POA: Diagnosis not present

## 2020-04-27 DIAGNOSIS — E559 Vitamin D deficiency, unspecified: Secondary | ICD-10-CM

## 2020-04-27 DIAGNOSIS — R739 Hyperglycemia, unspecified: Secondary | ICD-10-CM | POA: Diagnosis not present

## 2020-04-27 DIAGNOSIS — E782 Mixed hyperlipidemia: Secondary | ICD-10-CM | POA: Insufficient documentation

## 2020-04-27 DIAGNOSIS — Z6841 Body Mass Index (BMI) 40.0 and over, adult: Secondary | ICD-10-CM

## 2020-04-27 DIAGNOSIS — E66813 Obesity, class 3: Secondary | ICD-10-CM

## 2020-04-27 MED ORDER — VITAMIN D (ERGOCALCIFEROL) 1.25 MG (50000 UNIT) PO CAPS: 50000.0000 [IU] | ORAL_CAPSULE | ORAL | 0 refills | Status: DC

## 2020-04-27 NOTE — Progress Notes (Signed)
Chief Complaint:   OBESITY Jackie Montoya is here to discuss her progress with her obesity treatment plan along with follow-up of her obesity related diagnoses. Jackie Montoya is keeping a food journal and adhering to recommended goals of 1550 calories and 95-100 grams of protein and states she is following her eating plan approximately 80% of the time. Jackie Montoya states she is doing yard work 60 minutes 2 times per week.  Today's visit was #: 9 Starting weight: 287 lbs Starting date: 12/22/2019 Today's weight: 260 lbs Today's date: 04/27/2020 Total lbs lost to date: 27 Total lbs lost since last in-office visit: 1  Interim History: Jackie Montoya celebrated a birthday recently and had ice cream but has otherwise adhered well to the plan. She is journaling consistently and meeting her calorie and protein goals most days.  Subjective:   Jackie Montoya. Last Jackie Montoya level low at 29.7 on 12/22/2019. Jackie Montoya is on high dose Jackie Montoya.  Hyperglycemia. Jackie Montoya has a history of some elevated blood glucose readings without a diagnosis of diabetes. She denies polyphagia. She is not on metformin. Last fasting glucose was 105 on 12/22/2019.  Other hyperlipidemia. Jackie Montoya is on Jackie Montoya 20 mg daily LDL slightly elevated at 109 on 12/22/2019 with HDL and triglycerides within normal limits.  Lab Results  Component Value Date   CHOL 206 (H) 12/22/2019   HDL 77 12/22/2019   LDLCALC 109 (H) 12/22/2019   TRIG 116 12/22/2019   Lab Results  Component Value Date   ALT 20 12/22/2019   AST 21 12/22/2019   ALKPHOS 126 (H) 12/22/2019   BILITOT 0.6 12/22/2019   The 10-year ASCVD risk score Jackie Bussing DC Jr., Jackie al., Jackie Montoya) is: 11.7%   Values used to calculate the score:     Age: 69 years     Sex: Female     Is Non-Hispanic African American: No     Diabetic: No     Tobacco smoker: No     Systolic Blood Pressure: [id number] mmHg     Is BP treated: Yes     HDL Cholesterol: 77 mg/dL     Total Cholesterol: 206  mg/dL  Assessment/Plan:   Jackie Montoya. Low Jackie Montoya level contributes to fatigue and are associated with obesity, breast, and colon cancer. She was given a refill on her Jackie Montoya, Jackie, (DRISDOL) 1.25 MG (50000 UNIT) CAPS capsule every week #4 with 0 refills and Jackie Montoya, Fractures) level was ordered today.  Hyperglycemia. Fasting labs will be obtained and results with be discussed with Jackie Montoya in 2 weeks at her follow up visit.  Comprehensive metabolic panel, Hemoglobin A1c, Insulin, random labs ordered today.  Other hyperlipidemia. Cardiovascular risk and specific lipid/LDL goals reviewed.  We discussed several lifestyle modifications today and Jackie Montoya will continue to work on diet, exercise and weight loss efforts. Orders and follow up as documented in patient record. Comprehensive metabolic panel, Lipid Panel With LDL/HDL Ratio labs ordered today. She will continue Jackie Montoya as directed.   Class 3 severe obesity with serious comorbidity and body mass index (BMI) of 40.0 to 44.9 in adult, unspecified obesity type (Jackie Montoya).  Jackie Montoya is currently in the action stage of change. As such, her goal is to continue with weight loss efforts. She has agreed to keeping a food journal and adhering to recommended goals of 1500-1600 calories and 90-100 grams of protein daily.   Exercise goals: Jackie Montoya will add Jackie Montoya 15 minutes 2 times per week  to her current exercise regimen.  Behavioral modification strategies: meal planning and cooking strategies.  [name] has agreed to follow-up with our clinic in 2 weeks. She was informed of the importance of frequent follow-up visits to maximize her success with intensive lifestyle modifications for her multiple health conditions.   [name] was informed we would discuss her lab results at her next visit unless there is a critical issue that needs to be addressed sooner. [name] agreed to keep her next visit at the agreed upon  time to discuss these results.  Objective:   Blood pressure 135/78, pulse 75, temperature 98 F (36.7 C), temperature source Oral, height 5\' 5"  (1.651 m), weight 260 lb (117.9 kg), last menstrual period 06/15/2002, SpO2 95 %. Body mass index is 43.27 kg/m.  General: Cooperative, alert, well developed, in no acute distress. HEENT: Conjunctivae and lids unremarkable. Cardiovascular: Regular rhythm.  Lungs: Normal work of breathing. Neurologic: No focal deficits.   Lab Results  Component Value Date   CREATININE 0.74 12/22/2019   BUN 14 12/22/2019   NA 138 12/22/2019   K 4.0 12/22/2019   CL 97 12/22/2019   CO2 24 12/22/2019   Lab Results  Component Value Date   ALT 20 12/22/2019   AST 21 12/22/2019   ALKPHOS 126 (H) 12/22/2019   BILITOT 0.6 12/22/2019   Lab Results  Component Value Date   HGBA1C 5.5 12/22/2019   Lab Results  Component Value Date   INSULIN 11.7 12/22/2019   Lab Results  Component Value Date   TSH 1.870 12/22/2019   Lab Results  Component Value Date   CHOL 206 (H) 12/22/2019   HDL 77 12/22/2019   LDLCALC 109 (H) 12/22/2019   TRIG 116 12/22/2019   Lab Results  Component Value Date   WBC 7.0 11/29/2008   HGB 15.0 11/29/2008   HCT 44.7 11/29/2008   MCV 89.4 11/29/2008   PLT 242 11/29/2008   No results found for: IRON, TIBC, FERRITIN  Obesity Behavioral Intervention Documentation for Insurance:   Approximately 15 minutes were spent on the discussion below.  ASK: We discussed the diagnosis of obesity with Jackie Montoya today and [name] agreed to give [place] permission to discuss obesity behavioral modification therapy today.  ASSESS: Jackie Montoya has the diagnosis of obesity and her BMI today is 43.3. [name] is in the action stage of change.   ADVISE: [name] was educated on the multiple health risks of obesity as well as the benefit of weight loss to improve her health. She was advised of the need for long term treatment and the importance of lifestyle  modifications to improve her current health and to decrease her risk of future health problems.  AGREE: Multiple dietary modification options and treatment options were discussed and Alanna agreed to follow the recommendations documented in the above note.  ARRANGE: Naketa was educated on the importance of frequent visits to treat obesity as outlined per CMS and USPSTF guidelines and agreed to schedule her next follow up appointment today.  Attestation Statements:   Reviewed by clinician on day of visit: allergies, medications, problem list, medical history, surgical history, family history, social history, and previous encounter notes.  IMichaelene Song, am acting as Location manager for [employer], FNP   I have reviewed the above documentation for accuracy and completeness, and I agree with the above. -  Georgianne Fick, FNP

## 2020-04-28 LAB — COMPREHENSIVE METABOLIC PANEL
ALT: 18 IU/L (ref 0–32)
AST: 21 IU/L (ref 0–40)
Albumin/Globulin Ratio: 2.1 (ref 1.2–2.2)
Albumin: 4.7 g/dL (ref 3.8–4.8)
Alkaline Phosphatase: 118 IU/L — ABNORMAL HIGH (ref 39–117)
BUN/Creatinine Ratio: 15 (ref 12–28)
BUN: 11 mg/dL (ref 8–27)
Bilirubin Total: 0.5 mg/dL (ref 0.0–1.2)
CO2: 26 mmol/L (ref 20–29)
Calcium: 9.7 mg/dL (ref 8.7–10.3)
Chloride: 102 mmol/L (ref 96–106)
Creatinine, Ser: 0.74 mg/dL (ref 0.57–1.00)
GFR calc Af Amer: 96 mL/min/{1.73_m2} (ref >59)
GFR calc non Af Amer: 83 mL/min/{1.73_m2} (ref >59)
Globulin, Total: 2.2 g/dL (ref 1.5–4.5)
Glucose: 103 mg/dL — ABNORMAL HIGH (ref 65–99)
Potassium: 4.3 mmol/L (ref 3.5–5.2)
Sodium: 143 mmol/L (ref 134–144)
Total Protein: 6.9 g/dL (ref 6.0–8.5)

## 2020-04-28 LAB — VITAMIN D 25 HYDROXY (VIT D DEFICIENCY, FRACTURES): Vit D, 25-Hydroxy: 57.3 ng/mL (ref 30.0–100.0)

## 2020-04-28 LAB — INSULIN, RANDOM: INSULIN: 9.2 u[IU]/mL (ref 2.6–24.9)

## 2020-04-28 LAB — LIPID PANEL WITH LDL/HDL RATIO
Cholesterol, Total: 172 mg/dL (ref 100–199)
HDL: 72 mg/dL (ref >39)
LDL Chol Calc (NIH): 85 mg/dL (ref 0–99)
LDL/HDL Ratio: 1.2 ratio (ref 0.0–3.2)
Triglycerides: 82 mg/dL (ref 0–149)
VLDL Cholesterol Cal: 15 mg/dL (ref 5–40)

## 2020-04-28 LAB — HEMOGLOBIN A1C
Est. average glucose Bld gHb Est-mCnc: 111 mg/dL
Hgb A1c MFr Bld: 5.5 % (ref 4.8–5.6)

## 2020-05-11 ENCOUNTER — Other Ambulatory Visit: Payer: Self-pay

## 2020-05-11 ENCOUNTER — Ambulatory Visit (INDEPENDENT_AMBULATORY_CARE_PROVIDER_SITE_OTHER): Payer: Medicare Other | Admitting: Family Medicine

## 2020-05-11 ENCOUNTER — Encounter (INDEPENDENT_AMBULATORY_CARE_PROVIDER_SITE_OTHER): Payer: Self-pay | Admitting: Family Medicine

## 2020-05-11 VITALS — BP 129/74 | HR 80 | Temp 98.1°F | Ht 65.0 in | Wt 257.0 lb

## 2020-05-11 DIAGNOSIS — E559 Vitamin D deficiency, unspecified: Secondary | ICD-10-CM | POA: Diagnosis not present

## 2020-05-11 DIAGNOSIS — Z6841 Body Mass Index (BMI) 40.0 and over, adult: Secondary | ICD-10-CM | POA: Diagnosis not present

## 2020-05-11 DIAGNOSIS — E7849 Other hyperlipidemia: Secondary | ICD-10-CM

## 2020-05-11 NOTE — Progress Notes (Signed)
Chief Complaint:   OBESITY Jackie Montoya is here to discuss her progress with her obesity treatment plan along with follow-up of her obesity related diagnoses. Jackie Montoya is on keeping a food journal and adhering to recommended goals of 1500-1600 calories and 90-100 grams of protein daily and states she is following her eating plan approximately 80% of the time. Jackie Montoya states she is doing 0 minutes 0 times per week.  Today's visit was #: 9 Starting weight: 287 lbs Starting date: 12/22/2019 Today's weight: 257 lbs Today's date: 05/11/2020 Total lbs lost to date: 30 Total lbs lost since last in-office visit: 3  Interim History: Jackie Montoya is doing very well on the plan. She has now lost 30 lbs. She injured her toe and is letting it recover before exercising.  Subjective:   1. Vitamin D deficiency Jackie Montoya's Vit D level is at goal at 57.3. She has been on prescription Vit D weekly. I discussed labs with the patient today.   2. Other hyperlipidemia Jackie Montoya has hyperlipidemia and has been trying to improve her cholesterol levels with intensive lifestyle modification including a low saturated fat diet, exercise and weight loss. Her LDL has improved and it is now 85, but had been 109. Her HDL and triglycerides are within normal limits. She is on atorvastatin 20 mg. She denies any chest pain, claudication or myalgias. I discussed labs with the patient today.  Lab Results  Component Value Date   ALT 18 04/27/2020   AST 21 04/27/2020   ALKPHOS 118 (H) 04/27/2020   BILITOT 0.5 04/27/2020   Lab Results  Component Value Date   CHOL 172 04/27/2020   HDL 72 04/27/2020   LDLCALC 85 04/27/2020   TRIG 82 04/27/2020   Assessment/Plan:   1. Vitamin D deficiency Low Vitamin D level contributes to fatigue and are associated with obesity, breast, and colon cancer. Jackie Montoya will finish her current script of prescription Vit D, then start OTC Vit D3 5,000 IU daily. She will follow-up for routine testing of Vitamin D, at  least 2-3 times per year to avoid over-replacement.  2. Other hyperlipidemia Cardiovascular risk and specific lipid/LDL goals reviewed. We discussed several lifestyle modifications today. Jackie Montoya will continue atorvastatin, and will continue to work on diet, exercise and weight loss efforts.   3. Class 3 severe obesity with serious comorbidity and body mass index (BMI) of 40.0 to 44.9 in adult, unspecified obesity type (HCC) Jackie Montoya is currently in the action stage of change. As such, her goal is to continue with weight loss efforts. She has agreed to keeping a food journal and adhering to recommended goals of 1500-1600 calories and 90-100 grams of protein daily.   Exercise goals: All adults should avoid inactivity. Some physical activity is better than none, and adults who participate in any amount of physical activity gain some health benefits.  Behavioral modification strategies: planning for success.  Jackie Montoya has agreed to follow-up with our clinic in 3 weeks. She was informed of the importance of frequent follow-up visits to maximize her success with intensive lifestyle modifications for her multiple health conditions.   Objective:   Blood pressure 129/74, pulse 80, temperature 98.1 F (36.7 C), temperature source Oral, height 5\' 5"  (1.651 m), weight 257 lb (116.6 kg), last menstrual period 06/15/2002, SpO2 96 %. Body mass index is 42.77 kg/m.  General: Cooperative, alert, well developed, in no acute distress. HEENT: Conjunctivae and lids unremarkable. Cardiovascular: Regular rhythm.  Lungs: Normal work of breathing. Neurologic: No focal deficits.  Lab Results  Component Value Date   CREATININE 0.74 04/27/2020   BUN 11 04/27/2020   NA 143 04/27/2020   K 4.3 04/27/2020   CL 102 04/27/2020   CO2 26 04/27/2020   Lab Results  Component Value Date   ALT 18 04/27/2020   AST 21 04/27/2020   ALKPHOS 118 (H) 04/27/2020   BILITOT 0.5 04/27/2020   Lab Results  Component Value Date    HGBA1C 5.5 04/27/2020   HGBA1C 5.5 12/22/2019   Lab Results  Component Value Date   INSULIN 9.2 04/27/2020   INSULIN 11.7 12/22/2019   Lab Results  Component Value Date   TSH 1.870 12/22/2019   Lab Results  Component Value Date   CHOL 172 04/27/2020   HDL 72 04/27/2020   LDLCALC 85 04/27/2020   TRIG 82 04/27/2020   Lab Results  Component Value Date   WBC 7.0 11/29/2008   HGB 15.0 11/29/2008   HCT 44.7 11/29/2008   MCV 89.4 11/29/2008   PLT 242 11/29/2008   No results found for: IRON, TIBC, FERRITIN  Obesity Behavioral Intervention Documentation for Insurance:   Approximately 15 minutes were spent on the discussion below.  ASK: We discussed the diagnosis of obesity with Jackie Montoya today and Jackie Montoya agreed to give [place] permission to discuss obesity behavioral modification therapy today.  ASSESS: Jackie Montoya has the diagnosis of obesity and her BMI today is 42.77. Jackie Montoya is in the action stage of change.   ADVISE: Jackie Montoya was educated on the multiple health risks of obesity as well as the benefit of weight loss to improve her health. She was advised of the need for long term treatment and the importance of lifestyle modifications to improve her current health and to decrease her risk of future health problems.  AGREE: Multiple dietary modification options and treatment options were discussed and Jackie Montoya agreed to follow the recommendations documented in the above note.  ARRANGE: Jackie Montoya was educated on the importance of frequent visits to treat obesity as outlined per CMS and USPSTF guidelines and agreed to schedule her next follow up appointment today.  Attestation Statements:   Reviewed by clinician on day of visit: allergies, medications, problem list, medical history, surgical history, family history, social history, and previous encounter notes.   [name], am acting as Location manager for [employer], FNP-C.  I have reviewed the above documentation for accuracy and  completeness, and I agree with the above. -  Georgianne Fick, FNP

## 2020-06-01 ENCOUNTER — Other Ambulatory Visit: Payer: Self-pay

## 2020-06-01 ENCOUNTER — Encounter (INDEPENDENT_AMBULATORY_CARE_PROVIDER_SITE_OTHER): Payer: Self-pay | Admitting: Family Medicine

## 2020-06-01 ENCOUNTER — Ambulatory Visit (INDEPENDENT_AMBULATORY_CARE_PROVIDER_SITE_OTHER): Payer: Medicare Other | Admitting: Family Medicine

## 2020-06-01 VITALS — BP 120/75 | HR 79 | Temp 97.9°F | Ht 65.0 in | Wt 256.0 lb

## 2020-06-01 DIAGNOSIS — Z6841 Body Mass Index (BMI) 40.0 and over, adult: Secondary | ICD-10-CM | POA: Diagnosis not present

## 2020-06-01 DIAGNOSIS — I10 Essential (primary) hypertension: Secondary | ICD-10-CM

## 2020-06-05 NOTE — Progress Notes (Signed)
Chief Complaint:   OBESITY Jackie Montoya is here to discuss her progress with her obesity treatment plan along with follow-up of her obesity related diagnoses. Jackie Montoya is on keeping a food journal and adhering to recommended goals of 1500-1600 calories and 90-100 grams of protein daily and states she is following her eating plan approximately 75% of the time. Jackie Montoya states she is riding incumbent bike for 5 minutes 2-3 times per week.  Today's visit was #: 10 Starting weight: 287 lbs Starting date: 12/22/2019 Today's weight: 256 lbs Today's date: 06/01/2020 Total lbs lost to date: 31 Total lbs lost since last in-office visit: 1  Interim History: Jackie Montoya has had some changes in her personal life which got her off  plan. She has also had a stomach virus. She has journaled inconsistently due to these circumstances.  Subjective:   1. Essential hypertension Jackie Montoya's blood pressure is well controlled on Norvasc, hydrochlorothiazide, and losartan. She denies chest pain or shortness of breath.   BP Readings from Last 3 Encounters:  06/01/20 120/75  05/11/20 129/74  04/27/20 135/78   Lab Results  Component Value Date   CREATININE 0.74 04/27/2020   CREATININE 0.74 12/22/2019   CREATININE 0.65 11/29/2008   Assessment/Plan:   1. Essential hypertension Jackie Montoya will continue all of her medications, and will continue working on healthy weight loss and exercise to improve blood pressure control. We will watch for signs of hypotension as she continues her lifestyle modifications.  2. Class 3 severe obesity with serious comorbidity and body mass index (BMI) of 40.0 to 44.9 in adult, unspecified obesity type (HCC) Jackie Montoya is currently in the action stage of change. As such, her goal is to continue with weight loss efforts. She has agreed to keeping a food journal and adhering to recommended goals of 1500-1600 calories and 90-100 grams of protein daily.   We discussed the goal weight and decided on 180 lbs (29  BMI).  Exercise goals: As is.  Behavioral modification strategies: increasing lean protein intake, decreasing simple carbohydrates, planning for success and keeping a strict food journal.  Jackie Montoya has agreed to follow-up with our clinic in 3 weeks. She was informed of the importance of frequent follow-up visits to maximize her success with intensive lifestyle modifications for her multiple health conditions.   Objective:   Blood pressure 120/75, pulse 79, temperature 97.9 F (36.6 C), temperature source Oral, height 5\' 5"  (1.651 m), weight 256 lb (116.1 kg), last menstrual period 06/15/2002, SpO2 96 %. Body mass index is 42.6 kg/m.  General: Cooperative, alert, well developed, in no acute distress. HEENT: Conjunctivae and lids unremarkable. Cardiovascular: Regular rhythm.  Lungs: Normal work of breathing. Neurologic: No focal deficits.   Lab Results  Component Value Date   CREATININE 0.74 04/27/2020   BUN 11 04/27/2020   NA 143 04/27/2020   K 4.3 04/27/2020   CL 102 04/27/2020   CO2 26 04/27/2020   Lab Results  Component Value Date   ALT 18 04/27/2020   AST 21 04/27/2020   ALKPHOS 118 (H) 04/27/2020   BILITOT 0.5 04/27/2020   Lab Results  Component Value Date   HGBA1C 5.5 04/27/2020   HGBA1C 5.5 12/22/2019   Lab Results  Component Value Date   INSULIN 9.2 04/27/2020   INSULIN 11.7 12/22/2019   Lab Results  Component Value Date   TSH 1.870 12/22/2019   Lab Results  Component Value Date   CHOL 172 04/27/2020   HDL 72 04/27/2020   LDLCALC 85  04/27/2020   TRIG 82 04/27/2020   Lab Results  Component Value Date   WBC 7.0 11/29/2008   HGB 15.0 11/29/2008   HCT 44.7 11/29/2008   MCV 89.4 11/29/2008   PLT 242 11/29/2008   No results found for: IRON, TIBC, FERRITIN  Obesity Behavioral Intervention Documentation for Insurance:   Approximately 15 minutes were spent on the discussion below.  ASK: We discussed the diagnosis of obesity with Jackie Montoya today and  Jackie Montoya agreed to give [place] permission to discuss obesity behavioral modification therapy today.  ASSESS: Jackie Montoya has the diagnosis of obesity and her BMI today is 42.6. Jackie Montoya is in the action stage of change.   ADVISE: Jackie Montoya was educated on the multiple health risks of obesity as well as the benefit of weight loss to improve her health. She was advised of the need for long term treatment and the importance of lifestyle modifications to improve her current health and to decrease her risk of future health problems.  AGREE: Multiple dietary modification options and treatment options were discussed and Jackie Montoya agreed to follow the recommendations documented in the above note.  ARRANGE: Jackie Montoya was educated on the importance of frequent visits to treat obesity as outlined per CMS and USPSTF guidelines and agreed to schedule her next follow up appointment today.  Attestation Statements:   Reviewed by clinician on day of visit: allergies, medications, problem list, medical history, surgical history, family history, social history, and previous encounter notes.   [name], am acting as Location manager for [employer], FNP-C.  I have reviewed the above documentation for accuracy and completeness, and I agree with the above. -  Georgianne Fick, FNP

## 2020-06-21 ENCOUNTER — Encounter (INDEPENDENT_AMBULATORY_CARE_PROVIDER_SITE_OTHER): Payer: Self-pay

## 2020-06-21 ENCOUNTER — Ambulatory Visit (INDEPENDENT_AMBULATORY_CARE_PROVIDER_SITE_OTHER): Payer: Medicare Other | Admitting: Family Medicine

## 2020-06-22 ENCOUNTER — Ambulatory Visit (INDEPENDENT_AMBULATORY_CARE_PROVIDER_SITE_OTHER): Payer: Medicare Other | Admitting: Family Medicine

## 2020-06-22 ENCOUNTER — Encounter (INDEPENDENT_AMBULATORY_CARE_PROVIDER_SITE_OTHER): Payer: Self-pay | Admitting: Family Medicine

## 2020-06-22 ENCOUNTER — Other Ambulatory Visit: Payer: Self-pay

## 2020-06-22 VITALS — BP 122/74 | HR 80 | Temp 98.2°F | Ht 65.0 in | Wt 250.0 lb

## 2020-06-22 DIAGNOSIS — I10 Essential (primary) hypertension: Secondary | ICD-10-CM

## 2020-06-22 DIAGNOSIS — Z6841 Body Mass Index (BMI) 40.0 and over, adult: Secondary | ICD-10-CM

## 2020-06-27 NOTE — Progress Notes (Signed)
Chief Complaint:   OBESITY Robi is here to discuss her progress with her obesity treatment plan along with follow-up of her obesity related diagnoses. Giavonni is on keeping a food journal and adhering to recommended goals of 1500-1600 calories and 90-100 grams of protein daily and states she is following her eating plan approximately 60% of the time. Ziah states she is walking, riding stationary bike, and using resistance bands for 30 minutes 2 times per week.  Today's visit was #: 11 Starting weight: 287 lbs Starting date: 12/22/2019 Today's weight: 250 lbs Today's date: 06/22/2020 Total lbs lost to date: 37 Total lbs lost since last in-office visit: 6  Interim History: Jolana went on vacation last week but made smart choices. She did not journal on her trip but she is now back on the plan. She is doing very well on the plan with an overall loss of 37 lbs since 12/22/19.  Subjective:   1. Essential hypertension Canyon's blood pressure is well controlled on hydrochlorothiazide, losartan, and Norvasc. Cardiovascular ROS: no chest pain or dyspnea on exertion.  BP Readings from Last 3 Encounters:  06/22/20 122/74  06/01/20 120/75  05/11/20 129/74   Lab Results  Component Value Date   CREATININE 0.74 04/27/2020   CREATININE 0.74 12/22/2019   CREATININE 0.65 11/29/2008   Assessment/Plan:   1. Essential hypertension Alyssha will continue her medications, and will continue working on healthy weight loss and exercise to improve blood pressure control. We will watch for signs of hypotension as she continues her lifestyle modifications.  2. Class 3 severe obesity with serious comorbidity and body mass index (BMI) of 40.0 to 44.9 in adult, unspecified obesity type (HCC) Jaycee is currently in the action stage of change. As such, her goal is to continue with weight loss efforts. She has agreed to keeping a food journal and adhering to recommended goals of 1500-1600 calories and 90-100 grams of  protein daily.   Exercise goals: As is.  Behavioral modification strategies: keeping a strict food journal.  Marlow has agreed to follow-up with our clinic in 3 weeks with Dr. Owens Shark. She was informed of the importance of frequent follow-up visits to maximize her success with intensive lifestyle modifications for her multiple health conditions.   Objective:   Blood pressure 122/74, pulse 80, temperature 98.2 F (36.8 C), temperature source Oral, height 5\' 5"  (1.651 m), weight 250 lb (113.4 kg), last menstrual period 06/15/2002, SpO2 97 %. Body mass index is 41.6 kg/m.  General: Cooperative, alert, well developed, in no acute distress. HEENT: Conjunctivae and lids unremarkable. Cardiovascular: Regular rhythm.  Lungs: Normal work of breathing. Neurologic: No focal deficits.   Lab Results  Component Value Date   CREATININE 0.74 04/27/2020   BUN 11 04/27/2020   NA 143 04/27/2020   K 4.3 04/27/2020   CL 102 04/27/2020   CO2 26 04/27/2020   Lab Results  Component Value Date   ALT 18 04/27/2020   AST 21 04/27/2020   ALKPHOS 118 (H) 04/27/2020   BILITOT 0.5 04/27/2020   Lab Results  Component Value Date   HGBA1C 5.5 04/27/2020   HGBA1C 5.5 12/22/2019   Lab Results  Component Value Date   INSULIN 9.2 04/27/2020   INSULIN 11.7 12/22/2019   Lab Results  Component Value Date   TSH 1.870 12/22/2019   Lab Results  Component Value Date   CHOL 172 04/27/2020   HDL 72 04/27/2020   LDLCALC 85 04/27/2020   TRIG 82 04/27/2020  Lab Results  Component Value Date   WBC 7.0 11/29/2008   HGB 15.0 11/29/2008   HCT 44.7 11/29/2008   MCV 89.4 11/29/2008   PLT 242 11/29/2008   No results found for: IRON, TIBC, FERRITIN  Obesity Behavioral Intervention Documentation for Insurance:   Approximately 15 minutes were spent on the discussion below.  ASK: We discussed the diagnosis of obesity with [name] today and Jae agreed to give [place] permission to discuss obesity behavioral  modification therapy today.  ASSESS: Loraina has the diagnosis of obesity and her BMI today is 41.6. Ronnae is in the action stage of change.   ADVISE: Yonna was educated on the multiple health risks of obesity as well as the benefit of weight loss to improve her health. She was advised of the need for long term treatment and the importance of lifestyle modifications to improve her current health and to decrease her risk of future health problems.  AGREE: Multiple dietary modification options and treatment options were discussed and Janae agreed to follow the recommendations documented in the above note.  ARRANGE: Fatima was educated on the importance of frequent visits to treat obesity as outlined per CMS and USPSTF guidelines and agreed to schedule her next follow up appointment today.  Attestation Statements:   Reviewed by clinician on day of visit: allergies, medications, problem list, medical history, surgical history, family history, social history, and previous encounter notes.   [name], am acting as Location manager for [employer], FNP-C.  I have reviewed the above documentation for accuracy and completeness, and I agree with the above. -  Georgianne Fick, FNP

## 2020-06-28 ENCOUNTER — Encounter (INDEPENDENT_AMBULATORY_CARE_PROVIDER_SITE_OTHER): Payer: Self-pay | Admitting: Family Medicine

## 2020-07-13 ENCOUNTER — Ambulatory Visit (INDEPENDENT_AMBULATORY_CARE_PROVIDER_SITE_OTHER): Payer: Medicare Other | Admitting: Bariatrics

## 2020-07-13 ENCOUNTER — Encounter (INDEPENDENT_AMBULATORY_CARE_PROVIDER_SITE_OTHER): Payer: Self-pay | Admitting: Bariatrics

## 2020-07-13 ENCOUNTER — Other Ambulatory Visit: Payer: Self-pay

## 2020-07-13 VITALS — BP 134/83 | HR 85 | Temp 98.4°F | Ht 65.0 in | Wt 250.0 lb

## 2020-07-13 DIAGNOSIS — I10 Essential (primary) hypertension: Secondary | ICD-10-CM

## 2020-07-13 DIAGNOSIS — E7849 Other hyperlipidemia: Secondary | ICD-10-CM | POA: Diagnosis not present

## 2020-07-13 DIAGNOSIS — Z6841 Body Mass Index (BMI) 40.0 and over, adult: Secondary | ICD-10-CM

## 2020-07-13 NOTE — Progress Notes (Signed)
Chief Complaint:   OBESITY Jackie Montoya is here to discuss her progress with her obesity treatment plan along with follow-up of her obesity related diagnoses. Jackie Montoya is keeping a food journal and adhering to recommended goals of 1500 calories and 100 grams of protein and states she is following her eating plan approximately 80% of the time. Keerstin states she is exercises on the stationary bike 10 minutes 2 times per week.  Today's visit was #: 40 Starting weight: 287 lbs Starting date: 12/22/2019 Today's weight: 250 lbs Today's date: 07/13/2020 Total lbs lost to date: 37 Total lbs lost since last in-office visit: 0  Interim History: [name]'s weight remains the same. She states that she is doing well overall and reports she is drinking adequate water.  Subjective:   Essential hypertension. [name] is taking Cozaar and Norvasc.  BP Readings from Last 3 Encounters:  07/13/20 (!) 134/83  06/22/20 122/74  06/01/20 120/75   Lab Results  Component Value Date   CREATININE 0.74 04/27/2020   CREATININE 0.74 12/22/2019   CREATININE 0.65 11/29/2008   Other hyperlipidemia. Jackie Montoya is taking Lipitor.  Lab Results  Component Value Date   CHOL 172 04/27/2020   HDL 72 04/27/2020   LDLCALC 85 04/27/2020   TRIG 82 04/27/2020   Lab Results  Component Value Date   ALT 18 04/27/2020   AST 21 04/27/2020   ALKPHOS 118 (H) 04/27/2020   BILITOT 0.5 04/27/2020   The 10-year ASCVD risk score Jackie Montoya DC Jr., et al., 2013) is: 11.1%   Values used to calculate the score:     Age: 17 years     Sex: Female     Is Non-Hispanic African American: No     Diabetic: No     Tobacco smoker: No     Systolic Blood Pressure: 638 mmHg     Is BP treated: Yes     HDL Cholesterol: 72 mg/dL     Total Cholesterol: 172 mg/dL  Assessment/Plan:   Essential hypertension. Lashonta is working on healthy weight loss and exercise to improve blood pressure control. We will watch for signs of hypotension as she  continues her lifestyle modifications. She will continue her medications as directed.   Other hyperlipidemia. Cardiovascular risk and specific lipid/LDL goals reviewed.  We discussed several lifestyle modifications today and Leelah will continue to work on diet, exercise and weight loss efforts. Orders and follow up as documented in patient record. She will continue her medication as directed.   Counseling Intensive lifestyle modifications are the first line treatment for this issue. . Dietary changes: Increase soluble fiber. Decrease simple carbohydrates. . Exercise changes: Moderate to vigorous-intensity aerobic activity 150 minutes per week if tolerated. . Lipid-lowering medications: see documented in medical record.  Class 3 severe obesity with serious comorbidity and body mass index (BMI) of 40.0 to 44.9 in adult, unspecified obesity type ([place]).  Jackie Montoya is currently in the action stage of change. As such, her goal is to continue with weight loss efforts. She has agreed to keeping a food journal and adhering to recommended goals of 1500 calories and 100 grams of protein.   Exercise goals: Jackie Montoya will continue her stationary bike and gardening for exercise and gradually increase over time.  Behavioral modification strategies: increasing lean protein intake, decreasing simple carbohydrates, increasing vegetables, increasing water intake, decreasing eating out, no skipping meals, meal planning and cooking strategies, keeping healthy foods in the home and planning for success.  Jackie Montoya has agreed to  follow-up with our clinic in 2-3 weeks. She was informed of the importance of frequent follow-up visits to maximize her success with intensive lifestyle modifications for her multiple health conditions.   Objective:   Blood pressure (!) 134/83, pulse 85, temperature 98.4 F (36.9 C), height 5\' 5"  (1.651 m), weight (!) 250 lb (113.4 kg), last menstrual period 06/15/2002, SpO2 97 %. Body mass index is  41.6 kg/m.  General: Cooperative, alert, well developed, in no acute distress. HEENT: Conjunctivae and lids unremarkable. Cardiovascular: Regular rhythm.  Lungs: Normal work of breathing. Neurologic: No focal deficits.   Lab Results  Component Value Date   CREATININE 0.74 04/27/2020   BUN 11 04/27/2020   NA 143 04/27/2020   K 4.3 04/27/2020   CL 102 04/27/2020   CO2 26 04/27/2020   Lab Results  Component Value Date   ALT 18 04/27/2020   AST 21 04/27/2020   ALKPHOS 118 (H) 04/27/2020   BILITOT 0.5 04/27/2020   Lab Results  Component Value Date   HGBA1C 5.5 04/27/2020   HGBA1C 5.5 12/22/2019   Lab Results  Component Value Date   INSULIN 9.2 04/27/2020   INSULIN 11.7 12/22/2019   Lab Results  Component Value Date   TSH 1.870 12/22/2019   Lab Results  Component Value Date   CHOL 172 04/27/2020   HDL 72 04/27/2020   LDLCALC 85 04/27/2020   TRIG 82 04/27/2020   Lab Results  Component Value Date   WBC 7.0 11/29/2008   HGB 15.0 11/29/2008   HCT 44.7 11/29/2008   MCV 89.4 11/29/2008   PLT 242 11/29/2008   No results found for: IRON, TIBC, FERRITIN  Obesity Behavioral Intervention Documentation for Insurance:   Approximately 15 minutes were spent on the discussion below.  ASK: We discussed the diagnosis of obesity with Jackie Montoya today and Jackie Montoya agreed to give [place] permission to discuss obesity behavioral modification therapy today.  ASSESS: Simi has the diagnosis of obesity and her BMI today is 41.6. Neylan is in the action stage of change.   ADVISE: Jackie Montoya was educated on the multiple health risks of obesity as well as the benefit of weight loss to improve her health. She was advised of the need for long term treatment and the importance of lifestyle modifications to improve her current health and to decrease her risk of future health problems.  AGREE: Multiple dietary modification options and treatment options were discussed and Jackie Montoya agreed to follow the  recommendations documented in the above note.  ARRANGE: Jackie Montoya was educated on the importance of frequent visits to treat obesity as outlined per CMS and USPSTF guidelines and agreed to schedule her next follow up appointment today.  Attestation Statements:   Reviewed by clinician on day of visit: allergies, medications, problem list, medical history, surgical history, family history, social history, and previous encounter notes.  Migdalia Dk, am acting as Location manager for [employer], DO   I have reviewed the above documentation for accuracy and completeness, and I agree with the above. Jearld Lesch, DO

## 2020-07-20 ENCOUNTER — Other Ambulatory Visit: Payer: Self-pay | Admitting: Family Medicine

## 2020-07-20 DIAGNOSIS — F900 Attention-deficit hyperactivity disorder, predominantly inattentive type: Secondary | ICD-10-CM | POA: Diagnosis not present

## 2020-07-20 DIAGNOSIS — M1990 Unspecified osteoarthritis, unspecified site: Secondary | ICD-10-CM | POA: Diagnosis not present

## 2020-07-20 DIAGNOSIS — J45909 Unspecified asthma, uncomplicated: Secondary | ICD-10-CM | POA: Diagnosis not present

## 2020-07-20 DIAGNOSIS — F419 Anxiety disorder, unspecified: Secondary | ICD-10-CM | POA: Diagnosis not present

## 2020-07-20 DIAGNOSIS — Z1389 Encounter for screening for other disorder: Secondary | ICD-10-CM | POA: Diagnosis not present

## 2020-07-20 DIAGNOSIS — K219 Gastro-esophageal reflux disease without esophagitis: Secondary | ICD-10-CM | POA: Diagnosis not present

## 2020-07-20 DIAGNOSIS — M8580 Other specified disorders of bone density and structure, unspecified site: Secondary | ICD-10-CM

## 2020-07-20 DIAGNOSIS — Z0000 Encounter for general adult medical examination without abnormal findings: Secondary | ICD-10-CM | POA: Diagnosis not present

## 2020-07-20 DIAGNOSIS — I10 Essential (primary) hypertension: Secondary | ICD-10-CM | POA: Diagnosis not present

## 2020-07-20 DIAGNOSIS — F39 Unspecified mood [affective] disorder: Secondary | ICD-10-CM | POA: Diagnosis not present

## 2020-07-20 DIAGNOSIS — R7303 Prediabetes: Secondary | ICD-10-CM | POA: Diagnosis not present

## 2020-07-20 DIAGNOSIS — E782 Mixed hyperlipidemia: Secondary | ICD-10-CM | POA: Diagnosis not present

## 2020-07-20 DIAGNOSIS — J309 Allergic rhinitis, unspecified: Secondary | ICD-10-CM | POA: Diagnosis not present

## 2020-07-26 ENCOUNTER — Other Ambulatory Visit: Payer: Self-pay | Admitting: Family Medicine

## 2020-07-26 DIAGNOSIS — M8580 Other specified disorders of bone density and structure, unspecified site: Secondary | ICD-10-CM

## 2020-08-03 ENCOUNTER — Encounter (INDEPENDENT_AMBULATORY_CARE_PROVIDER_SITE_OTHER): Payer: Self-pay | Admitting: Family Medicine

## 2020-08-03 ENCOUNTER — Other Ambulatory Visit: Payer: Self-pay

## 2020-08-03 ENCOUNTER — Ambulatory Visit (INDEPENDENT_AMBULATORY_CARE_PROVIDER_SITE_OTHER): Payer: Medicare Other | Admitting: Family Medicine

## 2020-08-03 VITALS — BP 132/79 | HR 80 | Temp 97.3°F | Ht 65.0 in | Wt 248.0 lb

## 2020-08-03 DIAGNOSIS — I10 Essential (primary) hypertension: Secondary | ICD-10-CM

## 2020-08-03 DIAGNOSIS — Z6841 Body Mass Index (BMI) 40.0 and over, adult: Secondary | ICD-10-CM | POA: Diagnosis not present

## 2020-08-03 NOTE — Progress Notes (Signed)
Chief Complaint:   OBESITY Colie is here to discuss her progress with her obesity treatment plan along with follow-up of her obesity related diagnoses. Daylyn is on keeping a food journal and adhering to recommended goals of 1500 calories and 100 grams of protein daily and states she is following her eating plan approximately 50% of the time. Particia states she is walking for 25 minutes 3 times per week.  Today's visit was #: 14 Starting weight: 287 lbs Starting date: 12/22/2019 Today's weight: 248 lbs Today's date: 08/03/2020 Total lbs lost to date: 39 Total lbs lost since last in-office visit: 2  Interim History: Diala notes she has only lost 2 lbs over the last 6 weeks. She feels she is a bit tired of the plan overall.  She is not journaling consistently, only about 50% of the time.  Subjective:   1. Essential hypertension Marigny's blood pressure is well controlled on hydrochlorothiazide, losartan, and amlodipine. Cardiovascular ROS: no chest pain or dyspnea on exertion.  BP Readings from Last 3 Encounters:  08/03/20 132/79  07/13/20 (!) 134/83  06/22/20 122/74   Lab Results  Component Value Date   CREATININE 0.74 04/27/2020   CREATININE 0.74 12/22/2019   CREATININE 0.65 11/29/2008   Assessment/Plan:   1. Essential hypertension Mikaili will continue all her medications, and will continue working on healthy weight loss and exercise to improve blood pressure control. We will watch for signs of hypotension as she continues her lifestyle modifications.  2. Class 3 severe obesity with serious comorbidity and body mass index (BMI) of 40.0 to 44.9 in adult, unspecified obesity type (HCC) Nikolette is currently in the action stage of change. As such, her goal is to continue with weight loss efforts. She has agreed to keeping a food journal and adhering to recommended goals of 1400-1500 calories and 100 grams of protein daily.   Exercise goals: As is.  Behavioral modification  strategies: increasing lean protein intake, decreasing simple carbohydrates, planning for success and keeping a strict food journal.  Epsie has agreed to follow-up with our clinic in 3 weeks with Dr. Owens Shark. She was informed of the importance of frequent follow-up visits to maximize her success with intensive lifestyle modifications for her multiple health conditions.   Objective:   Blood pressure 132/79, pulse 80, temperature (!) 97.3 F (36.3 C), temperature source Oral, height 5\' 5"  (1.651 m), weight 248 lb (112.5 kg), last menstrual period 06/15/2002, SpO2 97 %. Body mass index is 41.27 kg/m.  General: Cooperative, alert, well developed, in no acute distress. HEENT: Conjunctivae and lids unremarkable. Cardiovascular: Regular rhythm.  Lungs: Normal work of breathing. Neurologic: No focal deficits.   Lab Results  Component Value Date   CREATININE 0.74 04/27/2020   BUN 11 04/27/2020   NA 143 04/27/2020   K 4.3 04/27/2020   CL 102 04/27/2020   CO2 26 04/27/2020   Lab Results  Component Value Date   ALT 18 04/27/2020   AST 21 04/27/2020   ALKPHOS 118 (H) 04/27/2020   BILITOT 0.5 04/27/2020   Lab Results  Component Value Date   HGBA1C 5.5 04/27/2020   HGBA1C 5.5 12/22/2019   Lab Results  Component Value Date   INSULIN 9.2 04/27/2020   INSULIN 11.7 12/22/2019   Lab Results  Component Value Date   TSH 1.870 12/22/2019   Lab Results  Component Value Date   CHOL 172 04/27/2020   HDL 72 04/27/2020   LDLCALC 85 04/27/2020   TRIG 82  04/27/2020   Lab Results  Component Value Date   WBC 7.0 11/29/2008   HGB 15.0 11/29/2008   HCT 44.7 11/29/2008   MCV 89.4 11/29/2008   PLT 242 11/29/2008   No results found for: IRON, TIBC, FERRITIN  Obesity Behavioral Intervention Documentation for Insurance:   Approximately 15 minutes were spent on the discussion below.  ASK: We discussed the diagnosis of obesity with [name] today and Shawntee agreed to give [place] permission to  discuss obesity behavioral modification therapy today.  ASSESS: Deborrah has the diagnosis of obesity and her BMI today is 41.27. Morgann is in the action stage of change.   ADVISE: Adair was educated on the multiple health risks of obesity as well as the benefit of weight loss to improve her health. She was advised of the need for long term treatment and the importance of lifestyle modifications to improve her current health and to decrease her risk of future health problems.  AGREE: Multiple dietary modification options and treatment options were discussed and Orly agreed to follow the recommendations documented in the above note.  ARRANGE: Sharne was educated on the importance of frequent visits to treat obesity as outlined per CMS and USPSTF guidelines and agreed to schedule her next follow up appointment today.  Attestation Statements:   Reviewed by clinician on day of visit: allergies, medications, problem list, medical history, surgical history, family history, social history, and previous encounter notes.   [name], am acting as Location manager for [employer], FNP-C.  I have reviewed the above documentation for accuracy and completeness, and I agree with the above. -  Georgianne Fick, FNP

## 2020-08-17 DIAGNOSIS — Z01419 Encounter for gynecological examination (general) (routine) without abnormal findings: Secondary | ICD-10-CM | POA: Diagnosis not present

## 2020-08-17 DIAGNOSIS — Z124 Encounter for screening for malignant neoplasm of cervix: Secondary | ICD-10-CM | POA: Diagnosis not present

## 2020-08-18 DIAGNOSIS — Z124 Encounter for screening for malignant neoplasm of cervix: Secondary | ICD-10-CM | POA: Diagnosis not present

## 2020-08-24 ENCOUNTER — Ambulatory Visit (INDEPENDENT_AMBULATORY_CARE_PROVIDER_SITE_OTHER): Payer: Medicare Other | Admitting: Bariatrics

## 2020-08-25 DIAGNOSIS — Z23 Encounter for immunization: Secondary | ICD-10-CM | POA: Diagnosis not present

## 2020-09-06 ENCOUNTER — Ambulatory Visit (INDEPENDENT_AMBULATORY_CARE_PROVIDER_SITE_OTHER): Payer: Medicare Other | Admitting: Family Medicine

## 2020-09-06 ENCOUNTER — Other Ambulatory Visit: Payer: Self-pay

## 2020-09-06 ENCOUNTER — Encounter (INDEPENDENT_AMBULATORY_CARE_PROVIDER_SITE_OTHER): Payer: Self-pay | Admitting: Family Medicine

## 2020-09-06 VITALS — BP 134/78 | HR 80 | Temp 97.7°F | Ht 65.0 in | Wt 249.0 lb

## 2020-09-06 DIAGNOSIS — I10 Essential (primary) hypertension: Secondary | ICD-10-CM

## 2020-09-06 DIAGNOSIS — Z6841 Body Mass Index (BMI) 40.0 and over, adult: Secondary | ICD-10-CM

## 2020-09-07 ENCOUNTER — Encounter (INDEPENDENT_AMBULATORY_CARE_PROVIDER_SITE_OTHER): Payer: Self-pay | Admitting: Family Medicine

## 2020-09-07 NOTE — Progress Notes (Signed)
Chief Complaint:   OBESITY Alsha is here to discuss her progress with her obesity treatment plan along with follow-up of her obesity related diagnoses. Maly is on keeping a food journal and adhering to recommended goals of 1400-1500 calories and 100 grams of protein daily and states she is following her eating plan approximately 60-75% of the time. Alexiana states she has increased walking.  Today's visit was #: 15 Starting weight: 287 lbs Starting date: 12/22/2019 Today's weight: 249 lbs Today's date: 09/06/2020 Total lbs lost to date: 38 Total lbs lost since last in-office visit: 0  Interim History: Kathryn has been on vacation for 10 days and is up 1 lb today. She is now back on the plan. She averages 1550 calories per day.   Subjective:   1. Essential hypertension Lurena's blood pressure is well controlled at 134/78. She is on hydrochlorothiazide, Norvasc, and losartan.   BP Readings from Last 3 Encounters:  09/06/20 134/78  08/03/20 132/79  07/13/20 (!) 134/83   Lab Results  Component Value Date   CREATININE 0.74 04/27/2020   CREATININE 0.74 12/22/2019   CREATININE 0.65 11/29/2008   Assessment/Plan:   1. Essential hypertension Jolanda will continue all her medications.  2. Class 3 severe obesity with serious comorbidity and body mass index (BMI) of 40.0 to 44.9 in adult, unspecified obesity type (HCC) Emberlie is currently in the action stage of change. As such, her goal is to continue with weight loss efforts. She has agreed to keeping a food journal and adhering to recommended goals of 1400-1500 calories and 100 grams of protein daily.   Exercise goals: Add resistance training.  Behavioral modification strategies: increasing lean protein intake and keeping a strict food journal.  Shwanda has agreed to follow-up with our clinic in 2 weeks.  Objective:   Blood pressure 134/78, pulse 80, temperature 97.7 F (36.5 C), temperature source Oral, height 5\' 5"  (1.651 m), weight  249 lb (112.9 kg), last menstrual period 06/15/2002, SpO2 97 %. Body mass index is 41.44 kg/m.  General: Cooperative, alert, well developed, in no acute distress. HEENT: Conjunctivae and lids unremarkable. Cardiovascular: Regular rhythm.  Lungs: Normal work of breathing. Neurologic: No focal deficits.   Lab Results  Component Value Date   CREATININE 0.74 04/27/2020   BUN 11 04/27/2020   NA 143 04/27/2020   K 4.3 04/27/2020   CL 102 04/27/2020   CO2 26 04/27/2020   Lab Results  Component Value Date   ALT 18 04/27/2020   AST 21 04/27/2020   ALKPHOS 118 (H) 04/27/2020   BILITOT 0.5 04/27/2020   Lab Results  Component Value Date   HGBA1C 5.5 04/27/2020   HGBA1C 5.5 12/22/2019   Lab Results  Component Value Date   INSULIN 9.2 04/27/2020   INSULIN 11.7 12/22/2019   Lab Results  Component Value Date   TSH 1.870 12/22/2019   Lab Results  Component Value Date   CHOL 172 04/27/2020   HDL 72 04/27/2020   LDLCALC 85 04/27/2020   TRIG 82 04/27/2020   Lab Results  Component Value Date   WBC 7.0 11/29/2008   HGB 15.0 11/29/2008   HCT 44.7 11/29/2008   MCV 89.4 11/29/2008   PLT 242 11/29/2008   No results found for: IRON, TIBC, FERRITIN  Obesity Behavioral Intervention:   Approximately 15 minutes were spent on the discussion below.  ASK: We discussed the diagnosis of obesity with [name] today and Dalis agreed to give [place] permission to discuss obesity  behavioral modification therapy today.  ASSESS: Ndia has the diagnosis of obesity and her BMI today is 41.44. Meggin is in the action stage of change.   ADVISE: Marilouise was educated on the multiple health risks of obesity as well as the benefit of weight loss to improve her health. She was advised of the need for long term treatment and the importance of lifestyle modifications to improve her current health and to decrease her risk of future health problems.  AGREE: Multiple dietary modification options and treatment  options were discussed and Jorita agreed to follow the recommendations documented in the above note.  ARRANGE: Dior was educated on the importance of frequent visits to treat obesity as outlined per CMS and USPSTF guidelines and agreed to schedule her next follow up appointment today.  Attestation Statements:   Reviewed by clinician on day of visit: allergies, medications, problem list, medical history, surgical history, family history, social history, and previous encounter notes.   [name], am acting as Location manager for [employer], FNP-C.  I have reviewed the above documentation for accuracy and completeness, and I agree with the above. -  Georgianne Fick, FNP

## 2020-09-13 DIAGNOSIS — H25811 Combined forms of age-related cataract, right eye: Secondary | ICD-10-CM | POA: Diagnosis not present

## 2020-09-13 DIAGNOSIS — H26492 Other secondary cataract, left eye: Secondary | ICD-10-CM | POA: Diagnosis not present

## 2020-09-13 DIAGNOSIS — H16201 Unspecified keratoconjunctivitis, right eye: Secondary | ICD-10-CM | POA: Diagnosis not present

## 2020-09-14 DIAGNOSIS — Z23 Encounter for immunization: Secondary | ICD-10-CM | POA: Diagnosis not present

## 2020-09-19 DIAGNOSIS — H52201 Unspecified astigmatism, right eye: Secondary | ICD-10-CM | POA: Diagnosis not present

## 2020-09-19 DIAGNOSIS — H25811 Combined forms of age-related cataract, right eye: Secondary | ICD-10-CM | POA: Diagnosis not present

## 2020-09-19 DIAGNOSIS — H268 Other specified cataract: Secondary | ICD-10-CM | POA: Diagnosis not present

## 2020-09-20 ENCOUNTER — Ambulatory Visit (INDEPENDENT_AMBULATORY_CARE_PROVIDER_SITE_OTHER): Payer: Medicare Other | Admitting: Family Medicine

## 2020-09-27 ENCOUNTER — Other Ambulatory Visit: Payer: Self-pay

## 2020-09-27 ENCOUNTER — Encounter (INDEPENDENT_AMBULATORY_CARE_PROVIDER_SITE_OTHER): Payer: Self-pay | Admitting: Family Medicine

## 2020-09-27 ENCOUNTER — Ambulatory Visit (INDEPENDENT_AMBULATORY_CARE_PROVIDER_SITE_OTHER): Payer: Medicare Other | Admitting: Family Medicine

## 2020-09-27 VITALS — BP 137/82 | HR 86 | Temp 97.6°F | Ht 65.0 in | Wt 246.0 lb

## 2020-09-27 DIAGNOSIS — E8881 Metabolic syndrome: Secondary | ICD-10-CM | POA: Diagnosis not present

## 2020-09-27 DIAGNOSIS — E88819 Insulin resistance, unspecified: Secondary | ICD-10-CM

## 2020-09-27 DIAGNOSIS — Z6841 Body Mass Index (BMI) 40.0 and over, adult: Secondary | ICD-10-CM

## 2020-09-28 DIAGNOSIS — Z1159 Encounter for screening for other viral diseases: Secondary | ICD-10-CM | POA: Diagnosis not present

## 2020-09-28 NOTE — Progress Notes (Signed)
Chief Complaint:   OBESITY Jackie Montoya is here to discuss her progress with her obesity treatment plan along with follow-up of her obesity related diagnoses. Jackie Montoya is keeping a food journal and adhering to recommended goals of 1400-1500 calories and 100 grams of protein and states she is following her eating plan approximately 90% of the time. Jackie Montoya states she is biking 10 minutes 3-6 times per week.  Today's visit was #: 16 Starting weight: 287 lbs Starting date: 12/22/2019 Today's weight: 246 lbs Today's date: 09/27/2020 Total lbs lost to date: 41 Total lbs lost since last in-office visit: 3  Interim History: Jackie Montoya is journaling very consistently and reports meeting her calorie and protein goals. She has done very well and is down 41 lbs. She had cataract surgery last week, but was able to stick to the plan.  Subjective:   Insulin resistance. Jackie Montoya has a diagnosis of insulin resistance based on her elevated fasting insulin level >5.  Jackie Montoya feels hunger is mostly satisfied. She is not on metformin.  Lab Results  Component Value Date   INSULIN 9.2 04/27/2020   INSULIN 11.7 12/22/2019   Lab Results  Component Value Date   HGBA1C 5.5 04/27/2020   Assessment/Plan:   Insulin resistance. Jackie Montoya will continue to work on weight loss, exercise, and decreasing simple carbohydrates to help decrease the risk of diabetes.   Class 3 severe obesity with serious comorbidity and body mass index (BMI) of 40.0 to 44.9 in adult, unspecified obesity type (Jackie Montoya).  Jackie Montoya is currently in the action stage of change. As such, her goal is to continue with weight loss efforts. She has agreed to keeping a food journal and adhering to recommended goals of 1400-1500 calories and 100 grams of protein daily.   Exercise goals: Jackie Montoya will try to ride her bike for 10 minutes 5 times per week.  Behavioral modification strategies: increasing lean protein intake, decreasing simple carbohydrates, better  snacking choices and planning for success.  Jackie Montoya has agreed to follow-up with our clinic in 3 weeks.   Objective:   Blood pressure 137/82, pulse 86, temperature 97.6 F (36.4 C), height 5\' 5"  (1.651 m), weight 246 lb (111.6 kg), last menstrual period 06/15/2002, SpO2 96 %. Body mass index is 40.94 kg/m.  General: Cooperative, alert, well developed, in no acute distress. HEENT: Conjunctivae and lids unremarkable. Cardiovascular: Regular rhythm.  Lungs: Normal work of breathing. Neurologic: No focal deficits.   Lab Results  Component Value Date   CREATININE 0.74 04/27/2020   BUN 11 04/27/2020   NA 143 04/27/2020   K 4.3 04/27/2020   CL 102 04/27/2020   CO2 26 04/27/2020   Lab Results  Component Value Date   ALT 18 04/27/2020   AST 21 04/27/2020   ALKPHOS 118 (H) 04/27/2020   BILITOT 0.5 04/27/2020   Lab Results  Component Value Date   HGBA1C 5.5 04/27/2020   HGBA1C 5.5 12/22/2019   Lab Results  Component Value Date   INSULIN 9.2 04/27/2020   INSULIN 11.7 12/22/2019   Lab Results  Component Value Date   TSH 1.870 12/22/2019   Lab Results  Component Value Date   CHOL 172 04/27/2020   HDL 72 04/27/2020   LDLCALC 85 04/27/2020   TRIG 82 04/27/2020   Lab Results  Component Value Date   WBC 7.0 11/29/2008   HGB 15.0 11/29/2008   HCT 44.7 11/29/2008   MCV 89.4 11/29/2008   PLT 242 11/29/2008   No results  found for: IRON, TIBC, FERRITIN  Obesity Behavioral Intervention:   Approximately 15 minutes were spent on the discussion below.  ASK: We discussed the diagnosis of obesity with Jackie Montoya today and Jackie Montoya agreed to give [place] permission to discuss obesity behavioral modification therapy today.  ASSESS: Avamae has the diagnosis of obesity and her BMI today is 41.0. Jackie Montoya is in the action stage of change.   ADVISE: Jackie Montoya was educated on the multiple health risks of obesity as well as the benefit of weight loss to improve her health. She was advised of the need  for long term treatment and the importance of lifestyle modifications to improve her current health and to decrease her risk of future health problems.  AGREE: Multiple dietary modification options and treatment options were discussed and Jackie Montoya agreed to follow the recommendations documented in the above note.  ARRANGE: Jackie Montoya was educated on the importance of frequent visits to treat obesity as outlined per CMS and USPSTF guidelines and agreed to schedule her next follow up appointment today.  Attestation Statements:   Reviewed by clinician on day of visit: allergies, medications, problem list, medical history, surgical history, family history, social history, and previous encounter notes.  IMichaelene Song, am acting as Location manager for [employer], FNP-C   I have reviewed the above documentation for accuracy and completeness, and I agree with the above. -  Georgianne Fick, FNP

## 2020-10-04 DIAGNOSIS — D123 Benign neoplasm of transverse colon: Secondary | ICD-10-CM | POA: Diagnosis not present

## 2020-10-04 DIAGNOSIS — K635 Polyp of colon: Secondary | ICD-10-CM | POA: Diagnosis not present

## 2020-10-04 DIAGNOSIS — Z86010 Personal history of colonic polyps: Secondary | ICD-10-CM | POA: Diagnosis not present

## 2020-10-06 DIAGNOSIS — D123 Benign neoplasm of transverse colon: Secondary | ICD-10-CM | POA: Diagnosis not present

## 2020-10-06 DIAGNOSIS — K635 Polyp of colon: Secondary | ICD-10-CM | POA: Diagnosis not present

## 2020-10-13 DIAGNOSIS — R059 Cough, unspecified: Secondary | ICD-10-CM | POA: Diagnosis not present

## 2020-10-13 DIAGNOSIS — Z03818 Encounter for observation for suspected exposure to other biological agents ruled out: Secondary | ICD-10-CM | POA: Diagnosis not present

## 2020-10-13 DIAGNOSIS — R0981 Nasal congestion: Secondary | ICD-10-CM | POA: Diagnosis not present

## 2020-10-18 ENCOUNTER — Other Ambulatory Visit: Payer: Medicare Other

## 2020-10-18 ENCOUNTER — Encounter (INDEPENDENT_AMBULATORY_CARE_PROVIDER_SITE_OTHER): Payer: Self-pay | Admitting: Family Medicine

## 2020-10-18 ENCOUNTER — Telehealth (INDEPENDENT_AMBULATORY_CARE_PROVIDER_SITE_OTHER): Payer: Medicare Other | Admitting: Family Medicine

## 2020-10-18 DIAGNOSIS — J069 Acute upper respiratory infection, unspecified: Secondary | ICD-10-CM | POA: Diagnosis not present

## 2020-10-18 DIAGNOSIS — Z6841 Body Mass Index (BMI) 40.0 and over, adult: Secondary | ICD-10-CM | POA: Diagnosis not present

## 2020-10-19 ENCOUNTER — Encounter (INDEPENDENT_AMBULATORY_CARE_PROVIDER_SITE_OTHER): Payer: Self-pay | Admitting: Family Medicine

## 2020-10-19 NOTE — Progress Notes (Signed)
TeleHealth Visit:  Due to the COVID-19 pandemic, this visit was completed with telemedicine (audio/video) technology to reduce patient and provider exposure as well as to preserve personal protective equipment.   Jackie Montoya has verbally consented to this TeleHealth visit. The patient is located at home, the provider is located at the [facility] office. The participants in this visit include the listed provider and patient. The visit was conducted today via MyChart video.   Chief Complaint: OBESITY Jackie Montoya is here to discuss her progress with her obesity treatment plan along with follow-up of her obesity related diagnoses. Jackie Montoya is on keeping a food journal and adhering to recommended goals of 1450-1500 calories and 100 grams of protein daily and states she is following her eating plan approximately 0% of the time. Jackie Montoya states she is doing 0 minutes 0 times per week.  Today's visit was #: 17 Starting weight: 287 lbs Starting date: 12/22/2019  Interim History: Jackie Montoya is sick with an upper respiratory infection. But notes it is improving. She feels she has maintained her weight. She has not been journaling since she has been sick. She asked about taking "Release" OTC supplement for weight loss.  Subjective:   1. Viral URI with cough Jackie Montoya denies fever, but she notes cough, congestion, and fatigue. She is now feeling better. She has been sick for about 2 weeks.  Assessment/Plan:   1. Viral URI with cough Jackie Montoya was advised that this is likely viral. She will make an appointment with her primary care physician if her condition worsens.  2. Class 3 severe obesity with serious comorbidity and body mass index (BMI) of 40.0 to 44.9 in adult, unspecified obesity type (HCC) Jackie Montoya is currently in the action stage of change. As such, her goal is to continue with weight loss efforts. She has agreed to keeping a food journal and adhering to recommended goals of 1400-1500 calories and 100  grams of protein daily.   I investigated "Release" supplement online and it does not appear to contain stimulants. Jackie Montoya may take Release, monitor her blood pressure after taking it, and discontinue if her blood pressure becomes elevated.  Exercise goals: No exercise has been prescribed at this time.  Behavioral modification strategies: increasing lean protein intake, decreasing simple carbohydrates and keeping a strict food journal.  Jackie Montoya has agreed to follow-up with our clinic in 2 weeks.   Objective:   VITALS: Per patient if applicable, see vitals. GENERAL: Alert and in no acute distress. CARDIOPULMONARY: No increased WOB. Speaking in clear sentences.  PSYCH: Pleasant and cooperative. Speech normal rate and rhythm. Affect is appropriate. Insight and judgement are appropriate. Attention is focused, linear, and appropriate.  NEURO: Oriented as arrived to appointment on time with no prompting.   Lab Results  Component Value Date   CREATININE 0.74 04/27/2020   BUN 11 04/27/2020   NA 143 04/27/2020   K 4.3 04/27/2020   CL 102 04/27/2020   CO2 26 04/27/2020   Lab Results  Component Value Date   ALT 18 04/27/2020   AST 21 04/27/2020   ALKPHOS 118 (H) 04/27/2020   BILITOT 0.5 04/27/2020   Lab Results  Component Value Date   HGBA1C 5.5 04/27/2020   HGBA1C 5.5 12/22/2019   Lab Results  Component Value Date   INSULIN 9.2 04/27/2020   INSULIN 11.7 12/22/2019   Lab Results  Component Value Date   TSH 1.870 12/22/2019   Lab Results  Component Value Date   CHOL 172 04/27/2020  HDL 72 04/27/2020   LDLCALC 85 04/27/2020   TRIG 82 04/27/2020   Lab Results  Component Value Date   WBC 7.0 11/29/2008   HGB 15.0 11/29/2008   HCT 44.7 11/29/2008   MCV 89.4 11/29/2008   PLT 242 11/29/2008   No results found for: IRON, TIBC, FERRITIN  Attestation Statements:   Reviewed by clinician on day of visit: allergies, medications, problem list, medical history, surgical history,  family history, social history, and previous encounter notes.   [name], am acting as Location manager for [employer], FNP-C.  I have reviewed the above documentation for accuracy and completeness, and I agree with the above. - Georgianne Fick, FNP

## 2020-11-02 ENCOUNTER — Encounter (INDEPENDENT_AMBULATORY_CARE_PROVIDER_SITE_OTHER): Payer: Self-pay | Admitting: Family Medicine

## 2020-11-02 ENCOUNTER — Ambulatory Visit (INDEPENDENT_AMBULATORY_CARE_PROVIDER_SITE_OTHER): Payer: Medicare Other | Admitting: Family Medicine

## 2020-11-02 ENCOUNTER — Other Ambulatory Visit: Payer: Self-pay

## 2020-11-02 VITALS — BP 142/77 | HR 81 | Temp 98.2°F | Ht 65.0 in | Wt 249.0 lb

## 2020-11-02 DIAGNOSIS — I10 Essential (primary) hypertension: Secondary | ICD-10-CM

## 2020-11-02 DIAGNOSIS — Z6841 Body Mass Index (BMI) 40.0 and over, adult: Secondary | ICD-10-CM

## 2020-11-02 DIAGNOSIS — R739 Hyperglycemia, unspecified: Secondary | ICD-10-CM

## 2020-11-02 DIAGNOSIS — E559 Vitamin D deficiency, unspecified: Secondary | ICD-10-CM

## 2020-11-02 DIAGNOSIS — E782 Mixed hyperlipidemia: Secondary | ICD-10-CM

## 2020-11-02 MED ORDER — VITAMIN D 125 MCG (5000 UT) PO CAPS: 1.0000 | ORAL_CAPSULE | Freq: Every day | ORAL | 0 refills | Status: AC

## 2020-11-03 LAB — COMPREHENSIVE METABOLIC PANEL
ALT: 15 IU/L (ref 0–32)
AST: 16 IU/L (ref 0–40)
Albumin/Globulin Ratio: 2.3 — ABNORMAL HIGH (ref 1.2–2.2)
Albumin: 4.6 g/dL (ref 3.8–4.8)
Alkaline Phosphatase: 117 IU/L (ref 44–121)
BUN/Creatinine Ratio: 16 (ref 12–28)
BUN: 12 mg/dL (ref 8–27)
Bilirubin Total: 0.5 mg/dL (ref 0.0–1.2)
CO2: 26 mmol/L (ref 20–29)
Calcium: 9.9 mg/dL (ref 8.7–10.3)
Chloride: 100 mmol/L (ref 96–106)
Creatinine, Ser: 0.73 mg/dL (ref 0.57–1.00)
GFR calc Af Amer: 97 mL/min/{1.73_m2} (ref >59)
GFR calc non Af Amer: 84 mL/min/{1.73_m2} (ref >59)
Globulin, Total: 2 g/dL (ref 1.5–4.5)
Glucose: 100 mg/dL — ABNORMAL HIGH (ref 65–99)
Potassium: 4.3 mmol/L (ref 3.5–5.2)
Sodium: 140 mmol/L (ref 134–144)
Total Protein: 6.6 g/dL (ref 6.0–8.5)

## 2020-11-03 LAB — HEMOGLOBIN A1C
Est. average glucose Bld gHb Est-mCnc: 111 mg/dL
Hgb A1c MFr Bld: 5.5 % (ref 4.8–5.6)

## 2020-11-03 LAB — LIPID PANEL WITH LDL/HDL RATIO
Cholesterol, Total: 195 mg/dL (ref 100–199)
HDL: 76 mg/dL (ref >39)
LDL Chol Calc (NIH): 103 mg/dL — ABNORMAL HIGH (ref 0–99)
LDL/HDL Ratio: 1.4 ratio (ref 0.0–3.2)
Triglycerides: 89 mg/dL (ref 0–149)
VLDL Cholesterol Cal: 16 mg/dL (ref 5–40)

## 2020-11-03 LAB — VITAMIN D 25 HYDROXY (VIT D DEFICIENCY, FRACTURES): Vit D, 25-Hydroxy: 54 ng/mL (ref 30.0–100.0)

## 2020-11-03 LAB — INSULIN, RANDOM: INSULIN: 10.2 u[IU]/mL (ref 2.6–24.9)

## 2020-11-06 ENCOUNTER — Encounter (INDEPENDENT_AMBULATORY_CARE_PROVIDER_SITE_OTHER): Payer: Self-pay | Admitting: Family Medicine

## 2020-11-06 NOTE — Progress Notes (Addendum)
Chief Complaint:   OBESITY Climmie is here to discuss her progress with her obesity treatment plan along with follow-up of her obesity related diagnoses. Amenda is on keeping a food journal and adhering to recommended goals of 1400-1500 calories and 100 grams of protein daily and states she is following her eating plan approximately 75% of the time. Jasmane states she is bike riding for 10 minutes 2 times per week.  Today's visit was #: 18 Starting weight: 287 lbs Starting date: 12/22/2019 Today's weight: 249 lbs Today's date: 11/02/2020 Total lbs lost to date: 38 Total lbs lost since last in-office visit: 0 (+3)  Interim History: Coby was off the plan while ill with an upper respiratory infection recently, and she ate mostly crackers and chicken noodle soup. She is journaling but going over on calories. She is having difficulty getting back on the plan. She started taking "Release" OTC supplement  for appetite and cravings, and she has not noticed that it helped. She says there is no stimulant in the medication.   Subjective:   1. Essential hypertension Cacie's blood pressure is slightly elevated today, but is usually well controlled. She is compliant with Norvasc, hydrochlorothiazide, and losartan.   BP Readings from Last 3 Encounters:  11/02/20 (!) 142/77  09/27/20 137/82  09/06/20 134/78   Lab Results  Component Value Date   CREATININE 0.73 11/02/2020   CREATININE 0.74 04/27/2020   CREATININE 0.74 12/22/2019   2. Hyperglycemia Sam's last 2 fasting glucose have been >100. She is not on metformin. Last A1c was 5.5.  3. Mixed hyperlipidemia Tiombe is on atorvastatin 20 mg. Last LDL, triglycerides, and HDL were within normal limits.   Lab Results  Component Value Date   ALT 15 11/02/2020   AST 16 11/02/2020   ALKPHOS 117 11/02/2020   BILITOT 0.5 11/02/2020   Lab Results  Component Value Date   CHOL 195 11/02/2020   HDL 76 11/02/2020   LDLCALC 103 (H) 11/02/2020    TRIG 89 11/02/2020  The 10-year ASCVD risk score [name] DC Jr., et al., 2013) is: 12.7%   Values used to calculate the score:     Age: 69 years     Sex: Female     Is Non-Hispanic African American: No     Diabetic: No     Tobacco smoker: No     Systolic Blood Pressure: 703 mmHg     Is BP treated: Yes     HDL Cholesterol: 76 mg/dL     Total Cholesterol: 195 mg/dL  4. Vitamin D deficiency Yared's last Vit D level was at goal at 110. She is on OTC Vit D 5,000 IU daily.   Assessment/Plan:   1. Essential hypertension She will continue all meds and we will continue to monitor.  2. Hyperglycemia Fasting labs will be obtained today, and results with be discussed with [name] in 2 weeks at her follow up visit. In the meanwhile Cashay will continue her meal plan and will work on weight loss efforts.  - Hemoglobin A1c - Insulin, random - Comprehensive metabolic panel  3. Mixed hyperlipidemia  We will check labs today.  - Lipid Panel With LDL/HDL Ratio  4. Vitamin D deficiency Low Vitamin D level contributes to fatigue and are associated with obesity, breast, and colon cancer. We will check labs today. Mendi agreed to continue taking OTC Vitamin D 5,000 IU daily and will follow-up for routine testing of Vitamin D, at least 2-3 times per year  to avoid over-replacement.  - VITAMIN D 25 Hydroxy (Vit-D Deficiency, Fractures)  5. Class 3 severe obesity with serious comorbidity and body mass index (BMI) of 40.0 to 44.9 in adult, unspecified obesity type (HCC) Tabytha is currently in the action stage of change. As such, her goal is to continue with weight loss efforts. She has agreed to keeping a food journal and adhering to recommended goals of 1400-1500 calories and 100 grams of protein daily.   Exercise goals: Cait is to increase her exercise.  Behavioral modification strategies: increasing lean protein intake, decreasing simple carbohydrates and keeping a strict food journal.  Odeal has agreed  to follow-up with our clinic in 3 weeks.  Breindel was informed we would discuss her lab results at her next visit unless there is a critical issue that needs to be addressed sooner. Angelique agreed to keep her next visit at the agreed upon time to discuss these results.  Objective:   Blood pressure (!) 142/77, pulse 81, temperature 98.2 F (36.8 C), height 5\' 5"  (1.651 m), weight 249 lb (112.9 kg), last menstrual period 06/15/2002, SpO2 97 %. Body mass index is 41.44 kg/m.  General: Cooperative, alert, well developed, in no acute distress. HEENT: Conjunctivae and lids unremarkable. Cardiovascular: Regular rhythm.  Lungs: Normal work of breathing. Neurologic: No focal deficits.   Lab Results  Component Value Date   CREATININE 0.73 11/02/2020   BUN 12 11/02/2020   NA 140 11/02/2020   K 4.3 11/02/2020   CL 100 11/02/2020   CO2 26 11/02/2020   Lab Results  Component Value Date   ALT 15 11/02/2020   AST 16 11/02/2020   ALKPHOS 117 11/02/2020   BILITOT 0.5 11/02/2020   Lab Results  Component Value Date   HGBA1C 5.5 11/02/2020   HGBA1C 5.5 04/27/2020   HGBA1C 5.5 12/22/2019   Lab Results  Component Value Date   INSULIN 10.2 11/02/2020   INSULIN 9.2 04/27/2020   INSULIN 11.7 12/22/2019   Lab Results  Component Value Date   TSH 1.870 12/22/2019   Lab Results  Component Value Date   CHOL 195 11/02/2020   HDL 76 11/02/2020   LDLCALC 103 (H) 11/02/2020   TRIG 89 11/02/2020   Lab Results  Component Value Date   WBC 7.0 11/29/2008   HGB 15.0 11/29/2008   HCT 44.7 11/29/2008   MCV 89.4 11/29/2008   PLT 242 11/29/2008   No results found for: IRON, TIBC, FERRITIN  Obesity Behavioral Intervention:   Approximately 15 minutes were spent on the discussion below.  ASK: We discussed the diagnosis of obesity with [name] today and Jamice agreed to give [place] permission to discuss obesity behavioral modification therapy today.  ASSESS: Elliemae has the diagnosis of obesity and her  BMI today is 41.44. Karsynn is in the action stage of change.   ADVISE: Kristen was educated on the multiple health risks of obesity as well as the benefit of weight loss to improve her health. She was advised of the need for long term treatment and the importance of lifestyle modifications to improve her current health and to decrease her risk of future health problems.  AGREE: Multiple dietary modification options and treatment options were discussed and Elmira agreed to follow the recommendations documented in the above note.  ARRANGE: Aysa was educated on the importance of frequent visits to treat obesity as outlined per CMS and USPSTF guidelines and agreed to schedule her next follow up appointment today.  Attestation Statements:   Reviewed  by clinician on day of visit: allergies, medications, problem list, medical history, surgical history, family history, social history, and previous encounter notes.   [name], am acting as Location manager for [employer], FNP-C.  I have reviewed the above documentation for accuracy and completeness, and I agree with the above. -  Georgianne Fick, FNP

## 2020-11-08 ENCOUNTER — Other Ambulatory Visit: Payer: Medicare Other

## 2020-11-16 ENCOUNTER — Ambulatory Visit (INDEPENDENT_AMBULATORY_CARE_PROVIDER_SITE_OTHER): Payer: Medicare Other | Admitting: Family Medicine

## 2020-11-29 ENCOUNTER — Other Ambulatory Visit: Payer: Self-pay

## 2020-11-29 ENCOUNTER — Ambulatory Visit (INDEPENDENT_AMBULATORY_CARE_PROVIDER_SITE_OTHER): Payer: Medicare Other | Admitting: Family Medicine

## 2020-11-29 ENCOUNTER — Encounter (INDEPENDENT_AMBULATORY_CARE_PROVIDER_SITE_OTHER): Payer: Self-pay | Admitting: Family Medicine

## 2020-11-29 VITALS — BP 137/77 | HR 80 | Temp 98.4°F | Ht 65.0 in | Wt 247.0 lb

## 2020-11-29 DIAGNOSIS — E559 Vitamin D deficiency, unspecified: Secondary | ICD-10-CM | POA: Diagnosis not present

## 2020-11-29 DIAGNOSIS — E7849 Other hyperlipidemia: Secondary | ICD-10-CM

## 2020-11-29 DIAGNOSIS — Z6841 Body Mass Index (BMI) 40.0 and over, adult: Secondary | ICD-10-CM

## 2020-11-29 NOTE — Progress Notes (Signed)
The 10-year ASCVD risk score [name] DC Brooke Bonito., et al., 2013) is: 11.9%   Values used to calculate the score:     Age: 69 years     Sex: Female     Is Non-Hispanic African American: No     Diabetic: No     Tobacco smoker: No     Systolic Blood Pressure: 288 mmHg     Is BP treated: Yes     HDL Cholesterol: 76 mg/dL     Total Cholesterol: 195 mg/dL

## 2020-12-04 NOTE — Progress Notes (Signed)
Chief Complaint:   OBESITY Jackie Montoya is here to discuss her progress with her obesity treatment plan along with follow-up of her obesity related diagnoses. Jackie Montoya is on keeping a food journal and adhering to recommended goals of 1400-1500 calories and 100 grams of protein daily and states she is following her eating plan approximately 75% of the time. Jackie Montoya states she is doing 0 minutes 0 times per week.  Today's visit was #: 29 Starting weight: 287 lbs Starting date: 12/22/2019 Today's weight: 247 lbs Today's date: 11/29/2020 Total lbs lost to date: 40 Total lbs lost since last in-office visit: 2  Interim History: Jackie Montoya is journaling fairly well, but she did not journal Thanksgiving week. She reports that she "cheated" Thanksgiving week.  Subjective:   1. Vitamin D deficiency Jackie Montoya is on OTC Vit D 5,000 IU q daily. Last Vit D level was at goal at 54. I discussed labs with the patient today.  2. Other hyperlipidemia Jackie Montoya's LDL was slightly elevated at 103, HDL is good at 76. She is on atorvastatin 20 mg. Her ASCVD risk score is 11.9%. She denies any chest pain, claudication or myalgias. I discussed labs with the patient today.  Lab Results  Component Value Date   ALT 15 11/02/2020   AST 16 11/02/2020   ALKPHOS 117 11/02/2020   BILITOT 0.5 11/02/2020   Lab Results  Component Value Date   CHOL 195 11/02/2020   HDL 76 11/02/2020   LDLCALC 103 (H) 11/02/2020   TRIG 89 11/02/2020   Assessment/Plan:   1. Vitamin D deficiency  Jackie Montoya agreed to continue taking OTC Vitamin D 5,000 IU daily and will follow-up for routine testing of Vitamin D, at least 2-3 times per year to avoid over-replacement.  2. Other hyperlipidemia Cardiovascular risk and specific lipid/LDL goals reviewed.  Jackie Montoya will continue atorvastatin 20 mg daily, and will continue to work on diet, exercise and weight loss efforts.    3. Class 3 severe obesity with serious comorbidity and body mass index (BMI) of 40.0  to 44.9 in adult, unspecified obesity type (HCC) Jackie Montoya is currently in the action stage of change. As such, her goal is to continue with weight loss efforts. She has agreed to keeping a food journal and adhering to recommended goals of 1400-1500 calories and 100 grams of protein daily.   Jackie Montoya will journal consistently except for Christmas day.   Exercise goals: No exercise has been prescribed at this time.  Behavioral modification strategies: increasing lean protein intake, decreasing simple carbohydrates and holiday eating strategies .  Jackie Montoya has agreed to follow-up with our clinic in 3 to 4 weeks.   Objective:   Blood pressure 137/77, pulse 80, temperature 98.4 F (36.9 C), height 5\' 5"  (1.651 m), weight 247 lb (112 kg), last menstrual period 06/15/2002, SpO2 97 %. Body mass index is 41.1 kg/m.  General: Cooperative, alert, well developed, in no acute distress. HEENT: Conjunctivae and lids unremarkable. Cardiovascular: Regular rhythm.  Lungs: Normal work of breathing. Neurologic: No focal deficits.   Lab Results  Component Value Date   CREATININE 0.73 11/02/2020   BUN 12 11/02/2020   NA 140 11/02/2020   K 4.3 11/02/2020   CL 100 11/02/2020   CO2 26 11/02/2020   Lab Results  Component Value Date   ALT 15 11/02/2020   AST 16 11/02/2020   ALKPHOS 117 11/02/2020   BILITOT 0.5 11/02/2020   Lab Results  Component Value Date   HGBA1C 5.5 11/02/2020  HGBA1C 5.5 04/27/2020   HGBA1C 5.5 12/22/2019   Lab Results  Component Value Date   INSULIN 10.2 11/02/2020   INSULIN 9.2 04/27/2020   INSULIN 11.7 12/22/2019   Lab Results  Component Value Date   TSH 1.870 12/22/2019   Lab Results  Component Value Date   CHOL 195 11/02/2020   HDL 76 11/02/2020   LDLCALC 103 (H) 11/02/2020   TRIG 89 11/02/2020   Lab Results  Component Value Date   WBC 7.0 11/29/2008   HGB 15.0 11/29/2008   HCT 44.7 11/29/2008   MCV 89.4 11/29/2008   PLT 242 11/29/2008   No results found  for: IRON, TIBC, FERRITIN  Obesity Behavioral Intervention:   Approximately 15 minutes were spent on the discussion below.  ASK: We discussed the diagnosis of obesity with Jackie Montoya today and Jackie Montoya agreed to give [place] permission to discuss obesity behavioral modification therapy today.  ASSESS: Jackie Montoya has the diagnosis of obesity and her BMI today is 41.1. Jackie Montoya is in the action stage of change.   ADVISE: Jackie Montoya was educated on the multiple health risks of obesity as well as the benefit of weight loss to improve her health. She was advised of the need for long term treatment and the importance of lifestyle modifications to improve her current health and to decrease her risk of future health problems.  AGREE: Multiple dietary modification options and treatment options were discussed and Jackie Montoya agreed to follow the recommendations documented in the above note.  ARRANGE: Jackie Montoya was educated on the importance of frequent visits to treat obesity as outlined per CMS and USPSTF guidelines and agreed to schedule her next follow up appointment today.  Attestation Statements:   Reviewed by clinician on day of visit: allergies, medications, problem list, medical history, surgical history, family history, social history, and previous encounter notes.   [name], am acting as Location manager for [employer], FNP-C.  I have reviewed the above documentation for accuracy and completeness, and I agree with the above. -  Georgianne Fick, FNP

## 2020-12-05 ENCOUNTER — Encounter (INDEPENDENT_AMBULATORY_CARE_PROVIDER_SITE_OTHER): Payer: Self-pay | Admitting: Family Medicine

## 2020-12-27 ENCOUNTER — Ambulatory Visit (INDEPENDENT_AMBULATORY_CARE_PROVIDER_SITE_OTHER): Payer: Medicare Other | Admitting: Family Medicine

## 2021-01-02 ENCOUNTER — Other Ambulatory Visit: Payer: Self-pay | Admitting: Family Medicine

## 2021-01-02 DIAGNOSIS — Z1231 Encounter for screening mammogram for malignant neoplasm of breast: Secondary | ICD-10-CM

## 2021-01-03 ENCOUNTER — Encounter (INDEPENDENT_AMBULATORY_CARE_PROVIDER_SITE_OTHER): Payer: Self-pay | Admitting: Family Medicine

## 2021-01-03 ENCOUNTER — Other Ambulatory Visit: Payer: Self-pay

## 2021-01-03 ENCOUNTER — Telehealth (INDEPENDENT_AMBULATORY_CARE_PROVIDER_SITE_OTHER): Payer: Medicare Other | Admitting: Family Medicine

## 2021-01-03 DIAGNOSIS — Z6841 Body Mass Index (BMI) 40.0 and over, adult: Secondary | ICD-10-CM | POA: Diagnosis not present

## 2021-01-03 DIAGNOSIS — G4733 Obstructive sleep apnea (adult) (pediatric): Secondary | ICD-10-CM

## 2021-01-03 DIAGNOSIS — E8881 Metabolic syndrome: Secondary | ICD-10-CM

## 2021-01-03 DIAGNOSIS — E88819 Insulin resistance, unspecified: Secondary | ICD-10-CM

## 2021-01-03 MED ORDER — METFORMIN HCL 500 MG PO TABS: 500.0000 mg | ORAL_TABLET | Freq: Every day | ORAL | 0 refills | Status: DC

## 2021-01-04 NOTE — Progress Notes (Signed)
TeleHealth Visit:  Due to the COVID-19 pandemic, this visit was completed with telemedicine (audio/video) technology to reduce patient and provider exposure as well as to preserve personal protective equipment.   Jackie Montoya has verbally consented to this TeleHealth visit. The patient is located at home, the provider is located at the [facility] office. The participants in this visit include the listed provider and patient. The visit was conducted today via MyChart video.   Chief Complaint: OBESITY Jackie Montoya is here to discuss her progress with her obesity treatment plan along with follow-up of her obesity related diagnoses. Jackie Montoya is on keeping a food journal and adhering to recommended goals of 1400-1500 calories and 100 grams of protein daily and states she is following her eating plan approximately 75% of the time. Jackie Montoya states she is doing 0 minutes 0 times per week.  Today's visit was #: 20 Starting weight: 287 lbs Starting date: 12/22/2019  Interim History: Jackie Montoya feels she has maintained her weight. She weighed 247 lbs at home today. She maintained her weight over the holidays. She is taking [place] apple cider vinegar and she feels it helps with digestion.  Subjective:   1. Insulin resistance Jackie Montoya's last fasting insulin was elevated at 10.2, and A1c was 5.5. She is not currently on metformin. We discussed side effects of metformin. She has occasional hunger between meals.  Lab Results  Component Value Date   INSULIN 10.2 11/02/2020   INSULIN 9.2 04/27/2020   INSULIN 11.7 12/22/2019   Lab Results  Component Value Date   HGBA1C 5.5 11/02/2020   2. OSA (obstructive sleep apnea) Jackie Montoya is wearing her CPAP consistently. She feels much better rested with it.  Assessment/Plan:   1. Insulin resistance . Jackie Montoya agreed to start metformin 500 mg q AM with no refills. She agreed to follow-up with Jackie Montoya as directed to closely monitor her progress.  - metFORMIN (GLUCOPHAGE) 500 MG  tablet; Take 1 tablet (500 mg total) by mouth daily with breakfast.  Dispense: 90 tablet; Refill: 0  2. OSA (obstructive sleep apnea)  Jackie Montoya will continue her CPAP, and will continue to work on diet, exercise and weight loss efforts.   3. Class 3 severe obesity with serious comorbidity and body mass index (BMI) of 40.0 to 44.9 in adult, unspecified obesity type (HCC) Jackie Montoya is currently in the action stage of change. As such, her goal is to continue with weight loss efforts. She has agreed to keeping a food journal and adhering to recommended goals of 1400-1500 calories and 100 grams of protein daily.   Exercise goals: Older adults should follow the adult guidelines. When older adults cannot meet the adult guidelines, they should be as physically active as their abilities and conditions will allow.   Behavioral modification strategies: decreasing simple carbohydrates and keeping a strict food journal.  Jackie Montoya has agreed to follow-up with our clinic in 2 to 3 weeks.   Objective:   VITALS: Per patient if applicable, see vitals. GENERAL: Alert and in no acute distress. CARDIOPULMONARY: No increased WOB. Speaking in clear sentences.  PSYCH: Pleasant and cooperative. Speech normal rate and rhythm. Affect is appropriate. Insight and judgement are appropriate. Attention is focused, linear, and appropriate.  NEURO: Oriented as arrived to appointment on time with no prompting.   Lab Results  Component Value Date   CREATININE 0.73 11/02/2020   BUN 12 11/02/2020   NA 140 11/02/2020   K 4.3 11/02/2020   CL 100 11/02/2020   CO2 26  11/02/2020   Lab Results  Component Value Date   ALT 15 11/02/2020   AST 16 11/02/2020   ALKPHOS 117 11/02/2020   BILITOT 0.5 11/02/2020   Lab Results  Component Value Date   HGBA1C 5.5 11/02/2020   HGBA1C 5.5 04/27/2020   HGBA1C 5.5 12/22/2019   Lab Results  Component Value Date   INSULIN 10.2 11/02/2020   INSULIN 9.2 04/27/2020   INSULIN 11.7 12/22/2019    Lab Results  Component Value Date   TSH 1.870 12/22/2019   Lab Results  Component Value Date   CHOL 195 11/02/2020   HDL 76 11/02/2020   LDLCALC 103 (H) 11/02/2020   TRIG 89 11/02/2020   Lab Results  Component Value Date   WBC 7.0 11/29/2008   HGB 15.0 11/29/2008   HCT 44.7 11/29/2008   MCV 89.4 11/29/2008   PLT 242 11/29/2008   No results found for: IRON, TIBC, FERRITIN  Attestation Statements:   Reviewed by clinician on day of visit: allergies, medications, problem list, medical history, surgical history, family history, social history, and previous encounter notes.   [name], am acting as Location manager for [employer], FNP-C.  I have reviewed the above documentation for accuracy and completeness, and I agree with the above. - Georgianne Fick, FNP

## 2021-01-07 ENCOUNTER — Encounter (INDEPENDENT_AMBULATORY_CARE_PROVIDER_SITE_OTHER): Payer: Self-pay | Admitting: Family Medicine

## 2021-01-07 DIAGNOSIS — G4733 Obstructive sleep apnea (adult) (pediatric): Secondary | ICD-10-CM | POA: Insufficient documentation

## 2021-01-23 ENCOUNTER — Encounter (INDEPENDENT_AMBULATORY_CARE_PROVIDER_SITE_OTHER): Payer: Self-pay

## 2021-01-24 ENCOUNTER — Telehealth (INDEPENDENT_AMBULATORY_CARE_PROVIDER_SITE_OTHER): Payer: Medicare Other | Admitting: Family Medicine

## 2021-01-24 ENCOUNTER — Encounter (INDEPENDENT_AMBULATORY_CARE_PROVIDER_SITE_OTHER): Payer: Self-pay | Admitting: Family Medicine

## 2021-01-24 ENCOUNTER — Other Ambulatory Visit: Payer: Self-pay

## 2021-01-24 DIAGNOSIS — E8881 Metabolic syndrome: Secondary | ICD-10-CM | POA: Diagnosis not present

## 2021-01-24 DIAGNOSIS — J069 Acute upper respiratory infection, unspecified: Secondary | ICD-10-CM | POA: Diagnosis not present

## 2021-01-24 DIAGNOSIS — Z6841 Body Mass Index (BMI) 40.0 and over, adult: Secondary | ICD-10-CM | POA: Diagnosis not present

## 2021-01-24 MED ORDER — AMOXICILLIN-POT CLAVULANATE 875-125 MG PO TABS: 1.0000 | ORAL_TABLET | Freq: Two times a day (BID) | ORAL | 0 refills | Status: DC

## 2021-01-25 ENCOUNTER — Other Ambulatory Visit: Payer: Medicare Other

## 2021-01-25 ENCOUNTER — Encounter (INDEPENDENT_AMBULATORY_CARE_PROVIDER_SITE_OTHER): Payer: Self-pay | Admitting: Family Medicine

## 2021-01-25 NOTE — Progress Notes (Signed)
TeleHealth Visit:  Due to the COVID-19 pandemic, this visit was completed with telemedicine (audio/video) technology to reduce patient and provider exposure as well as to preserve personal protective equipment.   Jackie Montoya has verbally consented to this TeleHealth visit. The patient is located at home, the provider is located at the [facility] office. The participants in this visit include the listed provider and patient. The visit was conducted today via MyChart Video.   Chief Complaint: OBESITY Jackie Montoya is here to discuss her progress with her obesity treatment plan along with follow-up of her obesity related diagnoses. Jackie Montoya is on keeping a food journal and adhering to recommended goals of 1400-1500 calories and 100 grams of protein daily and states she is following her eating plan approximately 75% of the time. Jackie Montoya states she is doing 0 minutes 0 times per week.  Today's visit was #: 21 Starting weight: 287 lbs Starting date: 12/22/2019  Interim History: We are doing a virtual visit today because Jackie Montoya has a cough, congestion. She feels she has maintained her weight. She notes her weight at home today was 247 lbs. Her last in office visit was 11/29/2020 and her weight was 247 lbs. She has not been journaling due to being sick.  Subjective:   1. URI with cough and congestion Jackie Montoya has been sick with an upper respiratory infection for 1-2 weeks. She notes she was sick 2 months ago and never really got over it.   Her ears feel clogged, and she notes headache and congestion. She uses fluticasone daily. She denies fever or shortness of breath. She has an appointment with her primary care physician today.  I informed her that medicare will not pay for 2 primary care visits in one day.  2. Insulin resistance Jackie Montoya was started on metformin at her last office visit, and she is tolerating it well.   Lab Results  Component Value Date   INSULIN 10.2 11/02/2020   INSULIN 9.2  04/27/2020   INSULIN 11.7 12/22/2019   Lab Results  Component Value Date   HGBA1C 5.5 11/02/2020   Assessment/Plan:   1. URI with cough and congestion Likely bacterial sinus infection given length of illness and th fact that it is not improving.  Adriella agreed to start Augmentin 875-125 mg BID with no refills. She may use Delsym for cough, and may use Afrin for congestion for 3 days only. COVID test was suggested.  - amoxicillin-clavulanate (AUGMENTIN) 875-125 MG tablet; Take 1 tablet by mouth 2 (two) times daily.  Dispense: 20 tablet; Refill: 0  2. Insulin resistance Jackie Montoya will continue metformin, and will continue to work on weight loss, exercise, and decreasing simple carbohydrates to help decrease the risk of diabetes. Jackie Montoya agreed to follow-up with [place] as directed to closely monitor her progress.  3. Class 3 severe obesity with serious comorbidity and body mass index (BMI) of 40.0 to 44.9 in adult, unspecified obesity type (HCC) Jackie Montoya is currently in the action stage of change. As such, her goal is to continue with weight loss efforts. She has agreed to keeping a food journal and adhering to recommended goals of 1400-1500 calories and 100 grams of protein daily.   Exercise goals: No exercise has been prescribed at this time.  Behavioral modification strategies: increasing lean protein intake and planning for success.  Jackie Montoya has agreed to follow-up with our clinic in 3 weeks.   Objective:   VITALS: Per patient if applicable, see vitals. GENERAL: Alert and in no  acute distress. CARDIOPULMONARY: No increased WOB. Speaking in clear sentences.  PSYCH: Pleasant and cooperative. Speech normal rate and rhythm. Affect is appropriate. Insight and judgement are appropriate. Attention is focused, linear, and appropriate.  NEURO: Oriented as arrived to appointment on time with no prompting.   Lab Results  Component Value Date   CREATININE 0.73 11/02/2020   BUN 12 11/02/2020   NA 140  11/02/2020   K 4.3 11/02/2020   CL 100 11/02/2020   CO2 26 11/02/2020   Lab Results  Component Value Date   ALT 15 11/02/2020   AST 16 11/02/2020   ALKPHOS 117 11/02/2020   BILITOT 0.5 11/02/2020   Lab Results  Component Value Date   HGBA1C 5.5 11/02/2020   HGBA1C 5.5 04/27/2020   HGBA1C 5.5 12/22/2019   Lab Results  Component Value Date   INSULIN 10.2 11/02/2020   INSULIN 9.2 04/27/2020   INSULIN 11.7 12/22/2019   Lab Results  Component Value Date   TSH 1.870 12/22/2019   Lab Results  Component Value Date   CHOL 195 11/02/2020   HDL 76 11/02/2020   LDLCALC 103 (H) 11/02/2020   TRIG 89 11/02/2020   Lab Results  Component Value Date   WBC 7.0 11/29/2008   HGB 15.0 11/29/2008   HCT 44.7 11/29/2008   MCV 89.4 11/29/2008   PLT 242 11/29/2008   No results found for: IRON, TIBC, FERRITIN  Attestation Statements:   Reviewed by clinician on day of visit: allergies, medications, problem list, medical history, surgical history, family history, social history, and previous encounter notes.   [name], am acting as Location manager for [employer], FNP-C.  I have reviewed the above documentation for accuracy and completeness, and I agree with the above. - Georgianne Fick, FNP

## 2021-01-31 DIAGNOSIS — I10 Essential (primary) hypertension: Secondary | ICD-10-CM | POA: Diagnosis not present

## 2021-01-31 DIAGNOSIS — S63501A Unspecified sprain of right wrist, initial encounter: Secondary | ICD-10-CM | POA: Diagnosis not present

## 2021-01-31 DIAGNOSIS — J45909 Unspecified asthma, uncomplicated: Secondary | ICD-10-CM | POA: Diagnosis not present

## 2021-01-31 DIAGNOSIS — R7303 Prediabetes: Secondary | ICD-10-CM | POA: Diagnosis not present

## 2021-01-31 DIAGNOSIS — F39 Unspecified mood [affective] disorder: Secondary | ICD-10-CM | POA: Diagnosis not present

## 2021-01-31 DIAGNOSIS — F419 Anxiety disorder, unspecified: Secondary | ICD-10-CM | POA: Diagnosis not present

## 2021-01-31 DIAGNOSIS — E782 Mixed hyperlipidemia: Secondary | ICD-10-CM | POA: Diagnosis not present

## 2021-02-07 DIAGNOSIS — G4733 Obstructive sleep apnea (adult) (pediatric): Secondary | ICD-10-CM | POA: Diagnosis not present

## 2021-02-15 ENCOUNTER — Encounter (INDEPENDENT_AMBULATORY_CARE_PROVIDER_SITE_OTHER): Payer: Self-pay | Admitting: Family Medicine

## 2021-02-15 ENCOUNTER — Ambulatory Visit (INDEPENDENT_AMBULATORY_CARE_PROVIDER_SITE_OTHER): Payer: Medicare Other | Admitting: Family Medicine

## 2021-02-15 ENCOUNTER — Other Ambulatory Visit: Payer: Self-pay

## 2021-02-15 VITALS — BP 162/74 | HR 80 | Temp 98.2°F | Ht 65.0 in | Wt 252.0 lb

## 2021-02-15 DIAGNOSIS — I10 Essential (primary) hypertension: Secondary | ICD-10-CM | POA: Diagnosis not present

## 2021-02-15 DIAGNOSIS — Z6841 Body Mass Index (BMI) 40.0 and over, adult: Secondary | ICD-10-CM | POA: Diagnosis not present

## 2021-02-19 ENCOUNTER — Encounter (INDEPENDENT_AMBULATORY_CARE_PROVIDER_SITE_OTHER): Payer: Self-pay | Admitting: Family Medicine

## 2021-02-19 NOTE — Progress Notes (Signed)
Chief Complaint:   OBESITY Jackie Montoya is here to discuss her progress with her obesity treatment plan along with follow-up of her obesity related diagnoses. Jackie Montoya is on keeping a food journal and adhering to recommended goals of 1400-1500 calories and 100 g protein and states she is following her eating plan approximately 50% of the time. Jackie Montoya states she is doing 0 minutes 0 times per week.  Today's visit was #: 22 Starting weight: 287 lbs Starting date: 12/22/2019 Today's weight: 252 lbs Today's date: 02/15/2021 Total lbs lost to date: 35 lbs Total lbs lost since last in-office visit: +5  Interim History: Jackie Montoya's last in office visit was 11/29/2020. She has had 2 virtual visits since then. She is up 5 lbs today. She is really struggling with making good food choices and not journaling consistently.  She has been ill but is feeling better. She has been keeping a 15 month old but has to stop because she is unable to keep up with him. She is sad about this.  Subjective:   1. Essential hypertension Jackie Montoya's BP is elevated today. She forgot her BP meds today. She is on Norvasc, losartan, and HCTZ. She denies chest pain or SOB.   BP Readings from Last 3 Encounters:  02/15/21 (!) 162/74  11/29/20 137/77  11/02/20 (!) 142/77    Assessment/Plan:   1. Essential hypertension  Jackie Montoya will take BP meds when she gets home.  2. Class 3 severe obesity with serious comorbidity and body mass index (BMI) of 40.0 to 44.9 in adult, unspecified obesity type (HCC) Jackie Montoya is currently in the action stage of change. As such, her goal is to continue with weight loss efforts. She has agreed to the Category 3 Plan.   Handouts: Category 3, Lunch & Breakfast Options.  Exercise goals: No exercise has been prescribed at this time.  Behavioral modification strategies: increasing lean protein intake and decreasing simple carbohydrates.  Jackie Montoya has agreed to follow-up with our clinic in 2 weeks.   Objective:    Blood pressure (!) 162/74, pulse 80, temperature 98.2 F (36.8 C), height 5\' 5"  (1.651 m), weight 252 lb (114.3 kg), last menstrual period 06/15/2002, SpO2 96 %. Body mass index is 41.93 kg/m.  General: Cooperative, alert, well developed, in no acute distress. HEENT: Conjunctivae and lids unremarkable. Cardiovascular: Regular rhythm.  Lungs: Normal work of breathing. Neurologic: No focal deficits.   Lab Results  Component Value Date   CREATININE 0.73 11/02/2020   BUN 12 11/02/2020   NA 140 11/02/2020   K 4.3 11/02/2020   CL 100 11/02/2020   CO2 26 11/02/2020   Lab Results  Component Value Date   ALT 15 11/02/2020   AST 16 11/02/2020   ALKPHOS 117 11/02/2020   BILITOT 0.5 11/02/2020   Lab Results  Component Value Date   HGBA1C 5.5 11/02/2020   HGBA1C 5.5 04/27/2020   HGBA1C 5.5 12/22/2019   Lab Results  Component Value Date   INSULIN 10.2 11/02/2020   INSULIN 9.2 04/27/2020   INSULIN 11.7 12/22/2019   Lab Results  Component Value Date   TSH 1.870 12/22/2019   Lab Results  Component Value Date   CHOL 195 11/02/2020   HDL 76 11/02/2020   LDLCALC 103 (H) 11/02/2020   TRIG 89 11/02/2020   Lab Results  Component Value Date   WBC 7.0 11/29/2008   HGB 15.0 11/29/2008   HCT 44.7 11/29/2008   MCV 89.4 11/29/2008   PLT 242 11/29/2008  No results found for: IRON, TIBC, FERRITIN  Obesity Behavioral Intervention:   Approximately 15 minutes were spent on the discussion below.  ASK: We discussed the diagnosis of obesity with Jackie Montoya today and Jackie Montoya agreed to give [place] permission to discuss obesity behavioral modification therapy today.  ASSESS: Jackie Montoya has the diagnosis of obesity and her BMI today is 42.0. Jackie Montoya is in the action stage of change.   ADVISE: Jackie Montoya was educated on the multiple health risks of obesity as well as the benefit of weight loss to improve her health. She was advised of the need for long term treatment and the importance of lifestyle  modifications to improve her current health and to decrease her risk of future health problems.  AGREE: Multiple dietary modification options and treatment options were discussed and Jackie Montoya agreed to follow the recommendations documented in the above note.  ARRANGE: Jackie Montoya was educated on the importance of frequent visits to treat obesity as outlined per CMS and USPSTF guidelines and agreed to schedule her next follow up appointment today.  Attestation Statements:   Reviewed by clinician on day of visit: allergies, medications, problem list, medical history, surgical history, family history, social history, and previous encounter notes.  Coral Ceo, am acting as Location manager for [employer], [place].  I have reviewed the above documentation for accuracy and completeness, and I agree with the above. -  Georgianne Fick, FNP

## 2021-03-01 ENCOUNTER — Other Ambulatory Visit: Payer: Self-pay

## 2021-03-01 ENCOUNTER — Telehealth (INDEPENDENT_AMBULATORY_CARE_PROVIDER_SITE_OTHER): Payer: Medicare Other | Admitting: Family Medicine

## 2021-03-01 ENCOUNTER — Encounter (INDEPENDENT_AMBULATORY_CARE_PROVIDER_SITE_OTHER): Payer: Self-pay | Admitting: Family Medicine

## 2021-03-01 DIAGNOSIS — E8881 Metabolic syndrome: Secondary | ICD-10-CM | POA: Diagnosis not present

## 2021-03-01 DIAGNOSIS — Z6841 Body Mass Index (BMI) 40.0 and over, adult: Secondary | ICD-10-CM | POA: Diagnosis not present

## 2021-03-01 DIAGNOSIS — E88819 Insulin resistance, unspecified: Secondary | ICD-10-CM

## 2021-03-06 NOTE — Progress Notes (Signed)
TeleHealth Visit:  Due to the COVID-19 pandemic, this visit was completed with telemedicine (audio/video) technology to reduce patient and provider exposure as well as to preserve personal protective equipment.   Jackie Montoya has verbally consented to this TeleHealth visit. The patient is located at home, the provider is located at the [facility] office. The participants in this visit include the listed provider and patient. The visit was conducted today via MyChart video.   Chief Complaint: OBESITY Jackie Montoya is here to discuss her progress with her obesity treatment plan along with follow-up of her obesity related diagnoses. Jackie Montoya is on the Category 3 Plan and states she is following her eating plan approximately 0% of the time. Jackie Montoya states she is doing 0 minutes 0 times per week.  Today's visit was #: 23 Starting weight: 287 lbs Starting date: 12/22/2019  Interim History:  Jackie Montoya reports she has lost weight and her weight today is 251 lbs. She was unable to follow the Category 3 because she wanted more food variety so she chose to start Weight Wat[employer] Online 1 week ago.   Subjective:   1. Insulin resistance Jancie's last fasting insulin was 10.2, and A1c was 5.5. Her hunger is satisfied on Weight Watchers.  Lab Results  Component Value Date   INSULIN 10.2 11/02/2020   INSULIN 9.2 04/27/2020   INSULIN 11.7 12/22/2019   Lab Results  Component Value Date   HGBA1C 5.5 11/02/2020   Assessment/Plan:   1. Insulin resistance Jackie Montoya will continue Weight Watchers. Jackie Montoya agreed to follow-up with [place] as directed to closely monitor her progress.  2. Class 3 severe obesity with serious comorbidity and body mass index (BMI) of 40.0 to 44.9 in adult, unspecified obesity type (HCC) Jackie Montoya is currently in the action stage of change. As such, her goal is to continue with weight loss efforts. She has agreed to practicing portion control and making smarter food choices, such as increasing  vegetables and decreasing simple carbohydrates (Weight Watchers).   Jackie Montoya will make sure she has protein at all meals.  Exercise goals: Jackie Montoya brought a Cubi and will use it for 15 minutes 5 times per week.  Behavioral modification strategies: increasing lean protein intake.  Jackie Montoya has agreed to follow-up with our clinic in 3 weeks.  Objective:   VITALS: Per patient if applicable, see vitals. GENERAL: Alert and in no acute distress. CARDIOPULMONARY: No increased WOB. Speaking in clear sentences.  PSYCH: Pleasant and cooperative. Speech normal rate and rhythm. Affect is appropriate. Insight and judgement are appropriate. Attention is focused, linear, and appropriate.  NEURO: Oriented as arrived to appointment on time with no prompting.   Lab Results  Component Value Date   CREATININE 0.73 11/02/2020   BUN 12 11/02/2020   NA 140 11/02/2020   K 4.3 11/02/2020   CL 100 11/02/2020   CO2 26 11/02/2020   Lab Results  Component Value Date   ALT 15 11/02/2020   AST 16 11/02/2020   ALKPHOS 117 11/02/2020   BILITOT 0.5 11/02/2020   Lab Results  Component Value Date   HGBA1C 5.5 11/02/2020   HGBA1C 5.5 04/27/2020   HGBA1C 5.5 12/22/2019   Lab Results  Component Value Date   INSULIN 10.2 11/02/2020   INSULIN 9.2 04/27/2020   INSULIN 11.7 12/22/2019   Lab Results  Component Value Date   TSH 1.870 12/22/2019   Lab Results  Component Value Date   CHOL 195 11/02/2020   HDL 76 11/02/2020   [name]  103 (H) 11/02/2020   TRIG 89 11/02/2020   Lab Results  Component Value Date   WBC 7.0 11/29/2008   HGB 15.0 11/29/2008   HCT 44.7 11/29/2008   MCV 89.4 11/29/2008   PLT 242 11/29/2008   No results found for: IRON, TIBC, FERRITIN  Attestation Statements:   Reviewed by clinician on day of visit: allergies, medications, problem list, medical history, surgical history, family history, social history, and previous encounter notes.   Jackie Montoya, am acting as  Location manager for [employer], FNP-C.  I have reviewed the above documentation for accuracy and completeness, and I agree with the above. - Georgianne Fick, FNP

## 2021-03-21 ENCOUNTER — Encounter (INDEPENDENT_AMBULATORY_CARE_PROVIDER_SITE_OTHER): Payer: Self-pay

## 2021-03-21 ENCOUNTER — Ambulatory Visit (INDEPENDENT_AMBULATORY_CARE_PROVIDER_SITE_OTHER): Payer: Medicare Other | Admitting: Family Medicine

## 2021-03-21 DIAGNOSIS — L918 Other hypertrophic disorders of the skin: Secondary | ICD-10-CM | POA: Diagnosis not present

## 2021-03-21 DIAGNOSIS — L821 Other seborrheic keratosis: Secondary | ICD-10-CM | POA: Diagnosis not present

## 2021-03-21 DIAGNOSIS — D2221 Melanocytic nevi of right ear and external auricular canal: Secondary | ICD-10-CM | POA: Diagnosis not present

## 2021-03-21 DIAGNOSIS — D1801 Hemangioma of skin and subcutaneous tissue: Secondary | ICD-10-CM | POA: Diagnosis not present

## 2021-03-21 DIAGNOSIS — L814 Other melanin hyperpigmentation: Secondary | ICD-10-CM | POA: Diagnosis not present

## 2021-03-21 DIAGNOSIS — D2271 Melanocytic nevi of right lower limb, including hip: Secondary | ICD-10-CM | POA: Diagnosis not present

## 2021-03-21 DIAGNOSIS — D692 Other nonthrombocytopenic purpura: Secondary | ICD-10-CM | POA: Diagnosis not present

## 2021-03-22 ENCOUNTER — Ambulatory Visit (INDEPENDENT_AMBULATORY_CARE_PROVIDER_SITE_OTHER): Payer: Medicare Other | Admitting: Family Medicine

## 2021-03-23 DIAGNOSIS — Z23 Encounter for immunization: Secondary | ICD-10-CM | POA: Diagnosis not present

## 2021-04-04 ENCOUNTER — Other Ambulatory Visit (INDEPENDENT_AMBULATORY_CARE_PROVIDER_SITE_OTHER): Payer: Self-pay | Admitting: Family Medicine

## 2021-04-04 DIAGNOSIS — E8881 Metabolic syndrome: Secondary | ICD-10-CM

## 2021-04-04 NOTE — Telephone Encounter (Signed)
Refill request

## 2021-04-18 ENCOUNTER — Other Ambulatory Visit: Payer: Self-pay

## 2021-04-18 ENCOUNTER — Encounter (INDEPENDENT_AMBULATORY_CARE_PROVIDER_SITE_OTHER): Payer: Self-pay | Admitting: Family Medicine

## 2021-04-18 ENCOUNTER — Ambulatory Visit (INDEPENDENT_AMBULATORY_CARE_PROVIDER_SITE_OTHER): Payer: Medicare Other | Admitting: Family Medicine

## 2021-04-18 VITALS — BP 134/83 | HR 79 | Temp 98.4°F | Ht 65.0 in | Wt 245.0 lb

## 2021-04-18 DIAGNOSIS — E88819 Insulin resistance, unspecified: Secondary | ICD-10-CM

## 2021-04-18 DIAGNOSIS — Z6841 Body Mass Index (BMI) 40.0 and over, adult: Secondary | ICD-10-CM

## 2021-04-18 DIAGNOSIS — E8881 Metabolic syndrome: Secondary | ICD-10-CM | POA: Diagnosis not present

## 2021-04-18 DIAGNOSIS — I10 Essential (primary) hypertension: Secondary | ICD-10-CM | POA: Diagnosis not present

## 2021-04-19 ENCOUNTER — Encounter (INDEPENDENT_AMBULATORY_CARE_PROVIDER_SITE_OTHER): Payer: Self-pay | Admitting: Family Medicine

## 2021-04-19 NOTE — Progress Notes (Signed)
Chief Complaint:   OBESITY Jackie Montoya is here to discuss her progress with her obesity treatment plan along with follow-up of her obesity related diagnoses. Jackie Montoya is on practicing portion control and making smarter food choices, such as increasing vegetables and decreasing simple carbohydrates and states she is following her eating plan approximately 85-90% of the time. Jackie Montoya states she is exercising with Cubii for 15 minutes 6 times per week.  Today's visit was #: 24 Starting weight: 287 lbs Starting date: 12/22/2019 Today's weight: 245 lbs Today's date: 04/18/2021 Total lbs lost to date: 42 Total lbs lost since last in-office visit: 7  Interim History: Jackie Montoya has been following Weight Watchers online and she is doing well. She feels she is back on track. She is down 7 lbs today. She is using her [place] exerciser (bike peddler). Her hunger is well controlled. She is getting in adequate protein.  Subjective:   1. Essential hypertension Jackie Montoya's blood pressure is well controll. She is on hydrochlorothiazide and losartan.   BP Readings from Last 3 Encounters:  04/18/21 134/83  02/15/21 (!) 162/74  11/29/20 137/77   Lab Results  Component Value Date   CREATININE 0.73 11/02/2020   CREATININE 0.74 04/27/2020   CREATININE 0.74 12/22/2019   2. Insulin resistance Jackie Montoya has occasional hunger.She is on metformin 500 mg QD.  Lab Results  Component Value Date   INSULIN 10.2 11/02/2020   INSULIN 9.2 04/27/2020   INSULIN 11.7 12/22/2019   Lab Results  Component Value Date   HGBA1C 5.5 11/02/2020   Assessment/Plan:   1. Essential hypertension Jackie Montoya will continue losartan and hydrochlorothiazide.  2. Insulin resistance Jackie Montoya agreed to increase metformin to 500 mg BID if needed for hunger.   3. Obesity: Current BMI 40.77 Jackie Montoya is currently in the action stage of change. As such, her goal is to continue with weight loss efforts. She has agreed to practicing portion control and making  smarter food choices, such as increasing vegetables and decreasing simple carbohydrates (Weight Watchers online).   Exercise goals: As is.  Behavioral modification strategies: planning for success.  Jackie Montoya has agreed to follow-up with our clinic in 3 weeks.   Objective:   Blood pressure 134/83, pulse 79, temperature 98.4 F (36.9 C), height 5\' 5"  (1.651 m), weight 245 lb (111.1 kg), last menstrual period 06/15/2002, SpO2 97 %. Body mass index is 40.77 kg/m.  General: Cooperative, alert, well developed, in no acute distress. HEENT: Conjunctivae and lids unremarkable. Cardiovascular: Regular rhythm.  Lungs: Normal work of breathing. Neurologic: No focal deficits.   Lab Results  Component Value Date   CREATININE 0.73 11/02/2020   BUN 12 11/02/2020   NA 140 11/02/2020   K 4.3 11/02/2020   CL 100 11/02/2020   CO2 26 11/02/2020   Lab Results  Component Value Date   ALT 15 11/02/2020   AST 16 11/02/2020   ALKPHOS 117 11/02/2020   BILITOT 0.5 11/02/2020   Lab Results  Component Value Date   HGBA1C 5.5 11/02/2020   HGBA1C 5.5 04/27/2020   HGBA1C 5.5 12/22/2019   Lab Results  Component Value Date   INSULIN 10.2 11/02/2020   INSULIN 9.2 04/27/2020   INSULIN 11.7 12/22/2019   Lab Results  Component Value Date   TSH 1.870 12/22/2019   Lab Results  Component Value Date   CHOL 195 11/02/2020   HDL 76 11/02/2020   LDLCALC 103 (H) 11/02/2020   TRIG 89 11/02/2020   Lab Results  Component Value Date  WBC 7.0 11/29/2008   HGB 15.0 11/29/2008   HCT 44.7 11/29/2008   MCV 89.4 11/29/2008   PLT 242 11/29/2008   No results found for: IRON, TIBC, FERRITIN  Obesity Behavioral Intervention:   Approximately 15 minutes were spent on the discussion below.  ASK: We discussed the diagnosis of obesity with Jackie Montoya today and Jackie Montoya agreed to give [place] permission to discuss obesity behavioral modification therapy today.  ASSESS: Jackie Montoya has the diagnosis of obesity and her BMI  today is 40.77. Jackie Montoya is in the action stage of change.   ADVISE: Jackie Montoya was educated on the multiple health risks of obesity as well as the benefit of weight loss to improve her health. She was advised of the need for long term treatment and the importance of lifestyle modifications to improve her current health and to decrease her risk of future health problems.  AGREE: Multiple dietary modification options and treatment options were discussed and Jackie Montoya agreed to follow the recommendations documented in the above note.  ARRANGE: Jackie Montoya was educated on the importance of frequent visits to treat obesity as outlined per CMS and USPSTF guidelines and agreed to schedule her next follow up appointment today.  Attestation Statements:   Reviewed by clinician on day of visit: allergies, medications, problem list, medical history, surgical history, family history, social history, and previous encounter notes.   Jackie Montoya, am acting as Location manager for [employer], FNP-C.  I have reviewed the above documentation for accuracy and completeness, and I agree with the above. -  Jackie Fick, FNP

## 2021-05-02 ENCOUNTER — Ambulatory Visit
Admission: RE | Admit: 2021-05-02 | Discharge: 2021-05-02 | Disposition: A | Discharge status: Home / Self Care | Payer: Medicare Other | Source: Ambulatory Visit | Attending: Family Medicine | Admitting: Family Medicine

## 2021-05-02 ENCOUNTER — Other Ambulatory Visit: Payer: Self-pay

## 2021-05-02 DIAGNOSIS — Z1231 Encounter for screening mammogram for malignant neoplasm of breast: Secondary | ICD-10-CM | POA: Diagnosis not present

## 2021-05-02 DIAGNOSIS — Z780 Asymptomatic menopausal state: Secondary | ICD-10-CM | POA: Diagnosis not present

## 2021-05-02 DIAGNOSIS — M8580 Other specified disorders of bone density and structure, unspecified site: Secondary | ICD-10-CM

## 2021-05-09 ENCOUNTER — Ambulatory Visit (INDEPENDENT_AMBULATORY_CARE_PROVIDER_SITE_OTHER): Payer: Medicare Other | Admitting: Family Medicine

## 2021-06-06 ENCOUNTER — Telehealth (INDEPENDENT_AMBULATORY_CARE_PROVIDER_SITE_OTHER): Payer: Medicare Other | Admitting: Family Medicine

## 2021-06-06 ENCOUNTER — Encounter (INDEPENDENT_AMBULATORY_CARE_PROVIDER_SITE_OTHER): Payer: Self-pay | Admitting: Family Medicine

## 2021-06-06 ENCOUNTER — Other Ambulatory Visit: Payer: Self-pay

## 2021-06-06 ENCOUNTER — Telehealth (INDEPENDENT_AMBULATORY_CARE_PROVIDER_SITE_OTHER): Payer: Self-pay

## 2021-06-06 DIAGNOSIS — E8881 Metabolic syndrome: Secondary | ICD-10-CM | POA: Diagnosis not present

## 2021-06-06 DIAGNOSIS — Z6841 Body Mass Index (BMI) 40.0 and over, adult: Secondary | ICD-10-CM | POA: Diagnosis not present

## 2021-06-06 DIAGNOSIS — M722 Plantar fascial fibromatosis: Secondary | ICD-10-CM | POA: Diagnosis not present

## 2021-06-06 NOTE — Telephone Encounter (Signed)
Patient returned Trina's call.

## 2021-06-07 NOTE — Progress Notes (Signed)
TeleHealth Visit:  Due to the COVID-19 pandemic, this visit was completed with telemedicine (audio/video) technology to reduce patient and provider exposure as well as to preserve personal protective equipment.   Jackie Montoya has verbally consented to this TeleHealth visit. The patient is located at home, the provider is located at the [facility] office. The participants in this visit include the listed provider and patient. The visit was conducted today via video.   Chief Complaint: OBESITY Jackie Montoya is here to discuss her progress with her obesity treatment plan along with follow-up of her obesity related diagnoses. Jackie Montoya is on practicing portion control and making smarter food choices, such as increasing vegetables and decreasing simple carbohydrates and states she is following her eating plan approximately 75% of the time. Jackie Montoya states she is doing yard work and cubii 15-20 minutes 7 times per week.  Today's visit was #: 25 Starting weight: 287 lbs Starting date: 12/22/2019  Interim History: Jackie Montoya weighed 245 lbs at home today. She was the same weight at last OV (04/18/2021). She had been following Weight Watchers. She is still following this and likes the variety of food. She feels she is doing well on her plan.  Subjective:   1. Insulin resistance [name]'s last fasting insulin was slightly elevated at 10.2. She is on Metformin 500 mg QD.  Lab Results  Component Value Date   INSULIN 10.2 11/02/2020   INSULIN 9.2 04/27/2020   INSULIN 11.7 12/22/2019   Lab Results  Component Value Date   HGBA1C 5.5 11/02/2020   2. Plantar fasciitis, right Jackie Montoya notes pain in her right heel. She has a history of plantar fasciitis. She is icing it some.  Assessment/Plan:   1. Insulin resistance Jackie Montoya will continue Metformin.  2. Plantar fasciitis, right Ice PRN intermittently with frozen water bottle. See podiatry if pain does not resolve soon. Take Tylenol for pain.  3. Obesity:  Current BMI 40.77  Jackie Montoya is currently in the action stage of change. As such, her goal is to continue with weight loss efforts. She has agreed to practicing portion control and making smarter food choices, such as increasing vegetables and decreasing simple carbohydrates.   Exercise goals:  As is  Behavioral modification strategies: increasing lean protein intake and decreasing simple carbohydrates.  Jackie Montoya has agreed to follow-up with our clinic in 2 weeks.   Objective:   VITALS: Per patient if applicable, see vitals. GENERAL: Alert and in no acute distress. CARDIOPULMONARY: No increased WOB. Speaking in clear sentences.  PSYCH: Pleasant and cooperative. Speech normal rate and rhythm. Affect is appropriate. Insight and judgement are appropriate. Attention is focused, linear, and appropriate.  NEURO: Oriented as arrived to appointment on time with no prompting.   Lab Results  Component Value Date   CREATININE 0.73 11/02/2020   BUN 12 11/02/2020   NA 140 11/02/2020   K 4.3 11/02/2020   CL 100 11/02/2020   CO2 26 11/02/2020   Lab Results  Component Value Date   ALT 15 11/02/2020   AST 16 11/02/2020   ALKPHOS 117 11/02/2020   BILITOT 0.5 11/02/2020   Lab Results  Component Value Date   HGBA1C 5.5 11/02/2020   HGBA1C 5.5 04/27/2020   HGBA1C 5.5 12/22/2019   Lab Results  Component Value Date   INSULIN 10.2 11/02/2020   INSULIN 9.2 04/27/2020   INSULIN 11.7 12/22/2019   Lab Results  Component Value Date   TSH 1.870 12/22/2019   Lab Results  Component Value Date  CHOL 195 11/02/2020   HDL 76 11/02/2020   LDLCALC 103 (H) 11/02/2020   TRIG 89 11/02/2020   Lab Results  Component Value Date   WBC 7.0 11/29/2008   HGB 15.0 11/29/2008   HCT 44.7 11/29/2008   MCV 89.4 11/29/2008   PLT 242 11/29/2008   No results found for: IRON, TIBC, FERRITIN  Attestation Statements:   Reviewed by clinician on day of visit: allergies, medications, problem list, medical history,  surgical history, family history, social history, and previous encounter notes.  Coral Ceo, CMA, am acting as Location manager for [employer], [place].  I have reviewed the above documentation for accuracy and completeness, and I agree with the above. - Georgianne Fick, FNP

## 2021-06-10 ENCOUNTER — Encounter (INDEPENDENT_AMBULATORY_CARE_PROVIDER_SITE_OTHER): Payer: Self-pay | Admitting: Family Medicine

## 2021-06-11 NOTE — Telephone Encounter (Signed)
Did you see this? 

## 2021-06-12 NOTE — Telephone Encounter (Signed)
Spoke to patient this day to prepare for video visit.

## 2021-06-13 DIAGNOSIS — M1811 Unilateral primary osteoarthritis of first carpometacarpal joint, right hand: Secondary | ICD-10-CM | POA: Diagnosis not present

## 2021-06-13 DIAGNOSIS — M654 Radial styloid tenosynovitis [de Quervain]: Secondary | ICD-10-CM | POA: Diagnosis not present

## 2021-06-13 DIAGNOSIS — M25531 Pain in right wrist: Secondary | ICD-10-CM | POA: Diagnosis not present

## 2021-06-14 DIAGNOSIS — Z20822 Contact with and (suspected) exposure to covid-19: Secondary | ICD-10-CM | POA: Diagnosis not present

## 2021-06-20 ENCOUNTER — Other Ambulatory Visit: Payer: Self-pay

## 2021-06-20 ENCOUNTER — Encounter (INDEPENDENT_AMBULATORY_CARE_PROVIDER_SITE_OTHER): Payer: Self-pay | Admitting: Family Medicine

## 2021-06-20 ENCOUNTER — Ambulatory Visit (INDEPENDENT_AMBULATORY_CARE_PROVIDER_SITE_OTHER): Payer: Medicare Other | Admitting: Family Medicine

## 2021-06-20 VITALS — BP 125/76 | HR 81 | Temp 98.5°F | Ht 65.0 in | Wt 248.0 lb

## 2021-06-20 DIAGNOSIS — I10 Essential (primary) hypertension: Secondary | ICD-10-CM

## 2021-06-20 DIAGNOSIS — R7301 Impaired fasting glucose: Secondary | ICD-10-CM | POA: Diagnosis not present

## 2021-06-20 DIAGNOSIS — Z6841 Body Mass Index (BMI) 40.0 and over, adult: Secondary | ICD-10-CM

## 2021-06-20 DIAGNOSIS — Z20822 Contact with and (suspected) exposure to covid-19: Secondary | ICD-10-CM | POA: Diagnosis not present

## 2021-06-20 MED ORDER — OZEMPIC (0.25 OR 0.5 MG/DOSE) 2 MG/1.5ML ~~LOC~~ SOPN: 0.2500 mg | PEN_INJECTOR | SUBCUTANEOUS | 0 refills | Status: DC

## 2021-06-26 ENCOUNTER — Encounter (INDEPENDENT_AMBULATORY_CARE_PROVIDER_SITE_OTHER): Payer: Self-pay | Admitting: Family Medicine

## 2021-06-26 NOTE — Progress Notes (Signed)
Chief Complaint:   OBESITY Jackie Montoya is here to discuss her progress with her obesity treatment plan along with follow-up of her obesity related diagnoses. Jackie Montoya is on practicing portion control and making smarter food choices, such as increasing vegetables and decreasing simple carbohydrates and states she is following her eating plan approximately 65% of the time. Jackie Montoya states she is doing [place] for 15 minutes 4 times per week.  Today's visit was #: 37 Starting weight: 287 lbs Starting date: 12/22/2019 Today's weight: 248 lbs Today's date: 06/20/2021 Total lbs lost to date: 39 lbs Total lbs lost since last in-office visit: +3  Interim History: Jackie Montoya says she has been off plan for a week.  She is following Weight Watchers and has 2 months left that she has paid for.  She has not had an in office visit since 04-18-2021 and is up 3 pounds.  Subjective:   1. Impaired fasting glucose Jackie Montoya endorses hunger.  Last A1c was 5.5.  has had fasting glucose >100 the last 2 checks.  2. Essential hypertension Well controlled on amlodipine, losartan, and HCTZ.  BP Readings from Last 3 Encounters:  06/20/21 125/76  04/18/21 134/83  02/15/21 (!) 162/74   Assessment/Plan:   1. Impaired fasting glucose New prescription for Ozempic 0.25 mg subcutaneously weekly.  Discontinue metformin.  - Start Semaglutide,0.25 or 0.5MG /DOS, (OZEMPIC, 0.25 OR 0.5 MG/DOSE,) 2 MG/1.5ML SOPN; Inject 0.25 mg into the skin once a week.  Dispense: 1.5 mL; Refill: 0  2. Essential hypertension Continue medications.   3. Obesity: Current BMI 41.27  Jackie Montoya is currently in the action stage of change. As such, her goal is to continue with weight loss efforts. She has agreed to practicing portion control and making smarter food choices, such as increasing vegetables and decreasing simple carbohydrates. Weight Watchers.  Exercise goals:  As is.  Behavioral modification strategies: increasing lean protein intake and  decreasing simple carbohydrates.  Jackie Montoya has agreed to follow-up with our clinic in 3 weeks, fasting.  Objective:   Blood pressure 125/76, pulse 81, temperature 98.5 F (36.9 C), height 5\' 5"  (1.651 m), weight 248 lb (112.5 kg), last menstrual period 06/15/2002, SpO2 97 %. Body mass index is 41.27 kg/m.  General: Cooperative, alert, well developed, in no acute distress. HEENT: Conjunctivae and lids unremarkable. Cardiovascular: Regular rhythm.  Lungs: Normal work of breathing. Neurologic: No focal deficits.   Lab Results  Component Value Date   CREATININE 0.73 11/02/2020   BUN 12 11/02/2020   NA 140 11/02/2020   K 4.3 11/02/2020   CL 100 11/02/2020   CO2 26 11/02/2020   Lab Results  Component Value Date   ALT 15 11/02/2020   AST 16 11/02/2020   ALKPHOS 117 11/02/2020   BILITOT 0.5 11/02/2020   Lab Results  Component Value Date   HGBA1C 5.5 11/02/2020   HGBA1C 5.5 04/27/2020   HGBA1C 5.5 12/22/2019   Lab Results  Component Value Date   INSULIN 10.2 11/02/2020   INSULIN 9.2 04/27/2020   INSULIN 11.7 12/22/2019   Lab Results  Component Value Date   TSH 1.870 12/22/2019   Lab Results  Component Value Date   CHOL 195 11/02/2020   HDL 76 11/02/2020   LDLCALC 103 (H) 11/02/2020   TRIG 89 11/02/2020   Lab Results  Component Value Date   VD25OH 54.0 11/02/2020   VD25OH 57.3 04/27/2020   VD25OH 29.7 (L) 12/22/2019   Lab Results  Component Value Date   WBC 7.0 11/29/2008  HGB 15.0 11/29/2008   HCT 44.7 11/29/2008   MCV 89.4 11/29/2008   PLT 242 11/29/2008   Obesity Behavioral Intervention:   Approximately 15 minutes were spent on the discussion below.  ASK: We discussed the diagnosis of obesity with Jackie Montoya today and Jackie Montoya agreed to give [place] permission to discuss obesity behavioral modification therapy today.  ASSESS: Jackie Montoya has the diagnosis of obesity and her BMI today is 41.4. Fate is in the action stage of change.   ADVISE: Seymone was educated on  the multiple health risks of obesity as well as the benefit of weight loss to improve her health. She was advised of the need for long term treatment and the importance of lifestyle modifications to improve her current health and to decrease her risk of future health problems.  AGREE: Multiple dietary modification options and treatment options were discussed and Debrina agreed to follow the recommendations documented in the above note.  ARRANGE: Diania was educated on the importance of frequent visits to treat obesity as outlined per CMS and USPSTF guidelines and agreed to schedule her next follow up appointment today.  Attestation Statements:   Reviewed by clinician on day of visit: allergies, medications, problem list, medical history, surgical history, family history, social history, and previous encounter notes.  I, Water quality scientist, CMA, am acting as Location manager for [employer], [place].  I have reviewed the above documentation for accuracy and completeness, and I agree with the above. -  Georgianne Fick, FNP

## 2021-07-11 ENCOUNTER — Ambulatory Visit (INDEPENDENT_AMBULATORY_CARE_PROVIDER_SITE_OTHER): Payer: Medicare Other | Admitting: Family Medicine

## 2021-07-11 ENCOUNTER — Other Ambulatory Visit: Payer: Self-pay

## 2021-07-11 ENCOUNTER — Encounter (INDEPENDENT_AMBULATORY_CARE_PROVIDER_SITE_OTHER): Payer: Self-pay | Admitting: Family Medicine

## 2021-07-11 VITALS — BP 122/72 | HR 82 | Temp 98.5°F | Ht 65.0 in | Wt 245.0 lb

## 2021-07-11 DIAGNOSIS — E559 Vitamin D deficiency, unspecified: Secondary | ICD-10-CM | POA: Diagnosis not present

## 2021-07-11 DIAGNOSIS — Z6841 Body Mass Index (BMI) 40.0 and over, adult: Secondary | ICD-10-CM

## 2021-07-11 DIAGNOSIS — R7301 Impaired fasting glucose: Secondary | ICD-10-CM | POA: Diagnosis not present

## 2021-07-11 DIAGNOSIS — E782 Mixed hyperlipidemia: Secondary | ICD-10-CM | POA: Diagnosis not present

## 2021-07-12 LAB — COMPREHENSIVE METABOLIC PANEL
ALT: 14 IU/L (ref 0–32)
AST: 15 IU/L (ref 0–40)
Albumin/Globulin Ratio: 2.1 (ref 1.2–2.2)
Albumin: 4.7 g/dL (ref 3.8–4.8)
Alkaline Phosphatase: 115 IU/L (ref 44–121)
BUN/Creatinine Ratio: 14 (ref 12–28)
BUN: 10 mg/dL (ref 8–27)
Bilirubin Total: 0.5 mg/dL (ref 0.0–1.2)
CO2: 26 mmol/L (ref 20–29)
Calcium: 9.7 mg/dL (ref 8.7–10.3)
Chloride: 100 mmol/L (ref 96–106)
Creatinine, Ser: 0.74 mg/dL (ref 0.57–1.00)
Globulin, Total: 2.2 g/dL (ref 1.5–4.5)
Glucose: 96 mg/dL (ref 65–99)
Potassium: 4.1 mmol/L (ref 3.5–5.2)
Sodium: 138 mmol/L (ref 134–144)
Total Protein: 6.9 g/dL (ref 6.0–8.5)
eGFR: 87 mL/min/{1.73_m2} (ref >59)

## 2021-07-12 LAB — HEMOGLOBIN A1C
Est. average glucose Bld gHb Est-mCnc: 105 mg/dL
Hgb A1c MFr Bld: 5.3 % (ref 4.8–5.6)

## 2021-07-12 LAB — INSULIN, RANDOM: INSULIN: 7.3 u[IU]/mL (ref 2.6–24.9)

## 2021-07-12 LAB — LIPID PANEL WITH LDL/HDL RATIO
Cholesterol, Total: 184 mg/dL (ref 100–199)
HDL: 74 mg/dL (ref >39)
LDL Chol Calc (NIH): 95 mg/dL (ref 0–99)
LDL/HDL Ratio: 1.3 ratio (ref 0.0–3.2)
Triglycerides: 80 mg/dL (ref 0–149)
VLDL Cholesterol Cal: 15 mg/dL (ref 5–40)

## 2021-07-12 LAB — VITAMIN D 25 HYDROXY (VIT D DEFICIENCY, FRACTURES): Vit D, 25-Hydroxy: 64.5 ng/mL (ref 30.0–100.0)

## 2021-07-16 ENCOUNTER — Encounter (INDEPENDENT_AMBULATORY_CARE_PROVIDER_SITE_OTHER): Payer: Self-pay | Admitting: Family Medicine

## 2021-07-16 DIAGNOSIS — R7301 Impaired fasting glucose: Secondary | ICD-10-CM | POA: Insufficient documentation

## 2021-07-16 NOTE — Progress Notes (Signed)
Chief Complaint:   OBESITY Danaiya is here to discuss her progress with her obesity treatment plan along with follow-up of her obesity related diagnoses. Keilan is on practicing portion control and making smarter food choices, such as increasing vegetables and decreasing simple carbohydrates and states she is following her eating plan approximately 85-90% of the time. Jaylei states she is using Cubii 5-7 minutes 6 times per week.  Today's visit was #: 64 Starting weight: 287 lbs Starting date: 12/22/2019 Today's weight: 245 lbs Today's date: 07/11/2021 Total lbs lost to date: 42 Total lbs lost since last in-office visit: 3  Interim History: Alexias notes that she is not hungry since starting the Ozempic. She is still following [employer] and has 2 more months of [employer]. She wants to continue [employer]. She is planning a [place] cruise for next year.  Subjective:   1. Impaired fasting glucose Kimberla  was started on Ozempic at her last OV. She has had some slight nausea.  Lab Results  Component Value Date   HGBA1C 5.3 07/11/2021   Lab Results  Component Value Date   INSULIN 7.3 07/11/2021   INSULIN 10.2 11/02/2020   INSULIN 9.2 04/27/2020   INSULIN 11.7 12/22/2019   2. Mixed hyperlipidemia Sharesa's LDL is slightly above goal at 103. Her triglycerides and HDL are within normal limits. She is on Lipitor 20 mg daily.  Lab Results  Component Value Date   ALT 14 07/11/2021   AST 15 07/11/2021   ALKPHOS 115 07/11/2021   BILITOT 0.5 07/11/2021   Lab Results  Component Value Date   CHOL 184 07/11/2021   HDL 74 07/11/2021   LDLCALC 95 07/11/2021   TRIG 80 07/11/2021   3. Vitamin D deficiency Kemiyah's Vit D is at goal at 24. She is currently taking OTC vitamin D 5,000 IU each day.   Lab Results  Component Value Date   VD25OH 64.5 07/11/2021   VD25OH 54.0 11/02/2020   VD25OH 57.3 04/27/2020   Assessment/Plan:   1. Impaired fasting glucose Continue Ozempic 0.25 mg weekly.  Check labs today.  - Comprehensive metabolic panel - Hemoglobin A1c - Insulin, random  2. Mixed hyperlipidemia Continue Lipitor as directed.  Counseling Intensive lifestyle modifications are the first line treatment for this issue. Dietary changes: Increase soluble fiber. Decrease simple carbohydrates. Exercise changes: Moderate to vigorous-intensity aerobic activity 150 minutes per week if tolerated. Lipid-lowering medications: see documented in medical record. Check labs today.  - Lipid Panel With LDL/HDL Ratio  3. Vitamin D deficiency She agrees to continue to take OTC Vitamin D 5,000 IU daily and will follow-up for routine testing of Vitamin D, at least 2-3 times per year to avoid over-replacement. Check labs today.  - VITAMIN D 25 Hydroxy (Vit-D Deficiency, Fractures)  4. Obesity: Current BMI 40.77  Jackelynn is currently in the action stage of change. As such, her goal is to continue with weight loss efforts. She has agreed to practicing portion control and making smarter food choices, such as increasing vegetables and decreasing simple carbohydrates and Weight Watchers.   Exercise goals:  As is  Behavioral modification strategies: planning for success.  Anicka has agreed to follow-up with our clinic in 3 weeks.  Devana was informed we would discuss her lab results at her next visit unless there is a critical issue that needs to be addressed sooner. Nahjae agreed to keep her next visit at the agreed upon time to discuss these results.  Objective:   Blood  pressure 122/72, pulse 82, temperature 98.5 F (36.9 C), height '5\' 5"'$  (1.651 m), weight 245 lb (111.1 kg), last menstrual period 06/15/2002, SpO2 98 %. Body mass index is 40.77 kg/m.  General: Cooperative, alert, well developed, in no acute distress. HEENT: Conjunctivae and lids unremarkable. Cardiovascular: Regular rhythm.  Lungs: Normal work of breathing. Neurologic: No focal deficits.   Lab Results  Component Value  Date   CREATININE 0.74 07/11/2021   BUN 10 07/11/2021   NA 138 07/11/2021   K 4.1 07/11/2021   CL 100 07/11/2021   CO2 26 07/11/2021   Lab Results  Component Value Date   ALT 14 07/11/2021   AST 15 07/11/2021   ALKPHOS 115 07/11/2021   BILITOT 0.5 07/11/2021   Lab Results  Component Value Date   HGBA1C 5.3 07/11/2021   HGBA1C 5.5 11/02/2020   HGBA1C 5.5 04/27/2020   HGBA1C 5.5 12/22/2019   Lab Results  Component Value Date   INSULIN 7.3 07/11/2021   INSULIN 10.2 11/02/2020   INSULIN 9.2 04/27/2020   INSULIN 11.7 12/22/2019   Lab Results  Component Value Date   TSH 1.870 12/22/2019   Lab Results  Component Value Date   CHOL 184 07/11/2021   HDL 74 07/11/2021   LDLCALC 95 07/11/2021   TRIG 80 07/11/2021   Lab Results  Component Value Date   VD25OH 64.5 07/11/2021   VD25OH 54.0 11/02/2020   VD25OH 57.3 04/27/2020   Lab Results  Component Value Date   WBC 7.0 11/29/2008   HGB 15.0 11/29/2008   HCT 44.7 11/29/2008   MCV 89.4 11/29/2008   PLT 242 11/29/2008   No results found for: IRON, TIBC, FERRITIN  Obesity Behavioral Intervention:   Approximately 15 minutes were spent on the discussion below.  ASK: We discussed the diagnosis of obesity with [name] today and Anica agreed to give [place] permission to discuss obesity behavioral modification therapy today.  ASSESS: Navayah has the diagnosis of obesity and her BMI today is 40.8. Avanti is in the action stage of change.   ADVISE: Rashima was educated on the multiple health risks of obesity as well as the benefit of weight loss to improve her health. She was advised of the need for long term treatment and the importance of lifestyle modifications to improve her current health and to decrease her risk of future health problems.  AGREE: Multiple dietary modification options and treatment options were discussed and Kailah agreed to follow the recommendations documented in the above note.  ARRANGE: Amaka was educated  on the importance of frequent visits to treat obesity as outlined per CMS and USPSTF guidelines and agreed to schedule her next follow up appointment today.  Attestation Statements:   Reviewed by clinician on day of visit: allergies, medications, problem list, medical history, surgical history, family history, social history, and previous encounter notes.  Coral Ceo, CMA, am acting as Location manager for [employer], [place].  I have reviewed the above documentation for accuracy and completeness, and I agree with the above. -  Georgianne Fick, FNP

## 2021-07-31 ENCOUNTER — Ambulatory Visit (INDEPENDENT_AMBULATORY_CARE_PROVIDER_SITE_OTHER): Payer: Medicare Other | Admitting: Family Medicine

## 2021-08-01 DIAGNOSIS — Z0000 Encounter for general adult medical examination without abnormal findings: Secondary | ICD-10-CM | POA: Diagnosis not present

## 2021-08-01 DIAGNOSIS — M720 Palmar fascial fibromatosis [Dupuytren]: Secondary | ICD-10-CM | POA: Diagnosis not present

## 2021-08-01 DIAGNOSIS — R7303 Prediabetes: Secondary | ICD-10-CM | POA: Diagnosis not present

## 2021-08-01 DIAGNOSIS — K219 Gastro-esophageal reflux disease without esophagitis: Secondary | ICD-10-CM | POA: Diagnosis not present

## 2021-08-01 DIAGNOSIS — J309 Allergic rhinitis, unspecified: Secondary | ICD-10-CM | POA: Diagnosis not present

## 2021-08-01 DIAGNOSIS — E782 Mixed hyperlipidemia: Secondary | ICD-10-CM | POA: Diagnosis not present

## 2021-08-01 DIAGNOSIS — F39 Unspecified mood [affective] disorder: Secondary | ICD-10-CM | POA: Diagnosis not present

## 2021-08-01 DIAGNOSIS — Z1389 Encounter for screening for other disorder: Secondary | ICD-10-CM | POA: Diagnosis not present

## 2021-08-01 DIAGNOSIS — F419 Anxiety disorder, unspecified: Secondary | ICD-10-CM | POA: Diagnosis not present

## 2021-08-01 DIAGNOSIS — I10 Essential (primary) hypertension: Secondary | ICD-10-CM | POA: Diagnosis not present

## 2021-08-01 DIAGNOSIS — J45909 Unspecified asthma, uncomplicated: Secondary | ICD-10-CM | POA: Diagnosis not present

## 2021-08-01 DIAGNOSIS — F900 Attention-deficit hyperactivity disorder, predominantly inattentive type: Secondary | ICD-10-CM | POA: Diagnosis not present

## 2021-08-02 ENCOUNTER — Other Ambulatory Visit: Payer: Self-pay

## 2021-08-02 ENCOUNTER — Ambulatory Visit (INDEPENDENT_AMBULATORY_CARE_PROVIDER_SITE_OTHER): Payer: Medicare Other | Admitting: Family Medicine

## 2021-08-02 ENCOUNTER — Encounter (INDEPENDENT_AMBULATORY_CARE_PROVIDER_SITE_OTHER): Payer: Self-pay | Admitting: Family Medicine

## 2021-08-02 VITALS — BP 100/61 | HR 81 | Temp 98.0°F | Ht 65.0 in | Wt 245.0 lb

## 2021-08-02 DIAGNOSIS — Z6841 Body Mass Index (BMI) 40.0 and over, adult: Secondary | ICD-10-CM

## 2021-08-02 DIAGNOSIS — I10 Essential (primary) hypertension: Secondary | ICD-10-CM

## 2021-08-02 DIAGNOSIS — R7301 Impaired fasting glucose: Secondary | ICD-10-CM

## 2021-08-02 MED ORDER — OZEMPIC (0.25 OR 0.5 MG/DOSE) 2 MG/1.5ML ~~LOC~~ SOPN: 0.5000 mg | PEN_INJECTOR | SUBCUTANEOUS | 0 refills | Status: DC

## 2021-08-03 ENCOUNTER — Encounter (INDEPENDENT_AMBULATORY_CARE_PROVIDER_SITE_OTHER): Payer: Self-pay | Admitting: Family Medicine

## 2021-08-03 DIAGNOSIS — R7301 Impaired fasting glucose: Secondary | ICD-10-CM

## 2021-08-06 MED ORDER — OZEMPIC (0.25 OR 0.5 MG/DOSE) 2 MG/1.5ML ~~LOC~~ SOPN: 0.5000 mg | PEN_INJECTOR | SUBCUTANEOUS | 0 refills | Status: DC

## 2021-08-06 NOTE — Telephone Encounter (Signed)
Please review

## 2021-08-06 NOTE — Telephone Encounter (Signed)
Last OV with Dawn 

## 2021-08-06 NOTE — Progress Notes (Signed)
Chief Complaint:   OBESITY Jackie Montoya is here to discuss her progress with her obesity treatment plan along with follow-up of her obesity related diagnoses. Jackie Montoya is on practicing portion control and making smarter food choices, such as increasing vegetables and decreasing simple carbohydrates and states she is following her eating plan approximately 80% of the time. Jackie Montoya states she is doing yard work and cubi for 10 minutes 6 times per week.  Today's visit was #: 28 Starting weight: 287 lbs Starting date: 12/22/2019 Today's weight: 245 lbs Today's date: 08/02/2021 Total lbs lost to date: 42 lbs Total lbs lost since last in-office visit: 0  Interim History: Jackie Montoya is following Weight Watcher's  plan-fruits and vegetables are zero points. She feels she may be overeating fruits and vegetables. She wants to go back to journaling when she finishes Weight Watcher's  next month.   Subjective:   1. Impaired fasting glucose Jackie Montoya is working fairly well. She notes occasional mild nausea. She denies constipation.  2. Essential hypertension Jackie Montoya's blood pressure is low today. She is on amlodipine 5 mg, HCTZ 25 mg and Losartan 25 mg. She denies dizziness.  BP Readings from Last 3 Encounters:  08/02/21 100/61  07/11/21 122/72  06/20/21 125/76     Assessment/Plan:   1. Impaired fasting glucose Jackie Montoya will increase dose of Ozempic. We will refill Ozempic 0.5 mg weekly with no refills.  - Semaglutide,0.25 or 0.'5MG'$ /DOS, (OZEMPIC, 0.25 OR 0.5 MG/DOSE,) 2 MG/1.5ML SOPN; Inject 0.5 mg into the skin once a week.  Dispense: 1.5 mL; Refill: 0  2. Essential hypertension Jackie Montoya will continue to monitor. She will continue anti - hypertensives. She is working on healthy weight loss and exercise to improve blood pressure control. We will watch for signs of hypotension as she continues her lifestyle modifications.   3. Obesity: Current BMI 40.77 Jackie Montoya is currently in the action stage of  change. As such, her goal is to continue with weight loss efforts. She has agreed to keeping a food journal and adhering to recommended goals of 1500 calories and 100 grams of protein and practicing portion control and making smarter food choices, such as increasing vegetables and decreasing simple carbohydrates.   Exercise goals:  As is.  Behavioral modification strategies: decreasing simple carbohydrates.  Jackie Montoya has agreed to follow-up with our clinic in 3 weeks.  Objective:   Blood pressure 100/61, pulse 81, temperature 98 F (36.7 C), height '5\' 5"'$  (1.651 m), weight 245 lb (111.1 kg), last menstrual period 06/15/2002, SpO2 99 %. Body mass index is 40.77 kg/m.  General: Cooperative, alert, well developed, in no acute distress. HEENT: Conjunctivae and lids unremarkable. Cardiovascular: Regular rhythm.  Lungs: Normal work of breathing. Neurologic: No focal deficits.   Lab Results  Component Value Date   CREATININE 0.74 07/11/2021   BUN 10 07/11/2021   NA 138 07/11/2021   K 4.1 07/11/2021   CL 100 07/11/2021   CO2 26 07/11/2021   Lab Results  Component Value Date   ALT 14 07/11/2021   AST 15 07/11/2021   ALKPHOS 115 07/11/2021   BILITOT 0.5 07/11/2021   Lab Results  Component Value Date   HGBA1C 5.3 07/11/2021   HGBA1C 5.5 11/02/2020   HGBA1C 5.5 04/27/2020   HGBA1C 5.5 12/22/2019   Lab Results  Component Value Date   INSULIN 7.3 07/11/2021   INSULIN 10.2 11/02/2020   INSULIN 9.2 04/27/2020   INSULIN 11.7 12/22/2019   Lab Results  Component Value Date  TSH 1.870 12/22/2019   Lab Results  Component Value Date   CHOL 184 07/11/2021   HDL 74 07/11/2021   LDLCALC 95 07/11/2021   TRIG 80 07/11/2021   Lab Results  Component Value Date   VD25OH 64.5 07/11/2021   VD25OH 54.0 11/02/2020   VD25OH 57.3 04/27/2020   Lab Results  Component Value Date   WBC 7.0 11/29/2008   HGB 15.0 11/29/2008   HCT 44.7 11/29/2008   MCV 89.4 11/29/2008   PLT 242 11/29/2008    No results found for: IRON, TIBC, FERRITIN  Obesity Behavioral Intervention:   Approximately 15 minutes were spent on the discussion below.  ASK: We discussed the diagnosis of obesity with Jackie Montoya today and Derry agreed to give [place] permission to discuss obesity behavioral modification therapy today.  ASSESS: Saina has the diagnosis of obesity and her BMI today is 40.8. Skylei is in the action stage of change.   ADVISE: Keyasia was educated on the multiple health risks of obesity as well as the benefit of weight loss to improve her health. She was advised of the need for long term treatment and the importance of lifestyle modifications to improve her current health and to decrease her risk of future health problems.  AGREE: Multiple dietary modification options and treatment options were discussed and Jackie Montoya agreed to follow the recommendations documented in the above note.  ARRANGE: Jackie Montoya was educated on the importance of frequent visits to treat obesity as outlined per CMS and USPSTF guidelines and agreed to schedule her next follow up appointment today.  Attestation Statements:   Reviewed by clinician on day of visit: allergies, medications, problem list, medical history, surgical history, family history, social history, and previous encounter notes.   I, Lizbeth Bark, RMA, am acting as Location manager for [employer], [place].   I have reviewed the above documentation for accuracy and completeness, and I agree with the above. -  Georgianne Fick, FNP

## 2021-08-22 DIAGNOSIS — Z23 Encounter for immunization: Secondary | ICD-10-CM | POA: Diagnosis not present

## 2021-08-24 DIAGNOSIS — Z20822 Contact with and (suspected) exposure to covid-19: Secondary | ICD-10-CM | POA: Diagnosis not present

## 2021-08-28 ENCOUNTER — Ambulatory Visit (INDEPENDENT_AMBULATORY_CARE_PROVIDER_SITE_OTHER): Payer: Medicare Other | Admitting: Family Medicine

## 2021-08-28 ENCOUNTER — Other Ambulatory Visit: Payer: Self-pay

## 2021-08-28 ENCOUNTER — Encounter (INDEPENDENT_AMBULATORY_CARE_PROVIDER_SITE_OTHER): Payer: Self-pay | Admitting: Family Medicine

## 2021-08-28 VITALS — BP 119/75 | HR 89 | Temp 98.6°F | Ht 65.0 in | Wt 242.0 lb

## 2021-08-28 DIAGNOSIS — Z6841 Body Mass Index (BMI) 40.0 and over, adult: Secondary | ICD-10-CM

## 2021-08-28 DIAGNOSIS — I10 Essential (primary) hypertension: Secondary | ICD-10-CM | POA: Diagnosis not present

## 2021-08-28 DIAGNOSIS — R7301 Impaired fasting glucose: Secondary | ICD-10-CM | POA: Diagnosis not present

## 2021-08-28 MED ORDER — OZEMPIC (0.25 OR 0.5 MG/DOSE) 2 MG/1.5ML ~~LOC~~ SOPN: 0.5000 mg | PEN_INJECTOR | SUBCUTANEOUS | 0 refills | Status: DC

## 2021-08-28 NOTE — Progress Notes (Signed)
Chief Complaint:   OBESITY Jackie Montoya is here to discuss her progress with her obesity treatment plan along with follow-up of her obesity related diagnoses. Jackie Montoya is on keeping a food journal and adhering to recommended goals of 1500 calories and 100 grams of protein and states she is following her eating plan approximately 75% of the time. Jackie Montoya states she is doing Cubii 5-10 minutes 6 times per week.  Today's visit was #: 69 Starting weight: 287 lbs Starting date: 12/22/2019 Today's weight: 242 lbs Today's date: 08/28/2021 Total lbs lost to date: 45 lbs Total lbs lost since last in-office visit: 3 lbs  Interim History: Jackie Montoya is still doing Weight Watchers and following the points system. She had signed up for 6 months. She feels the Ozempic helps quite a bit with appetite.  She has  made good progress since we started Ozempic.  Subjective:   1. Impaired fasting glucose Jackie Montoya has some mild stomach upset with Ozempc. She is on Ozempic 0.5 mg. She says the Ozempic really helps reduce her appetite.    Lab Results  Component Value Date   HGBA1C 5.3 07/11/2021   HGBA1C 5.5 11/02/2020   HGBA1C 5.5 04/27/2020   Lab Results  Component Value Date   LDLCALC 95 07/11/2021   CREATININE 0.74 07/11/2021   Lab Results  Component Value Date   INSULIN 7.3 07/11/2021   INSULIN 10.2 11/02/2020   INSULIN 9.2 04/27/2020   INSULIN 11.7 12/22/2019    2. Essential hypertension Jackie Montoya's hypertension is well controlled on amlodipine, Losartan and HCTZ.   BP Readings from Last 3 Encounters:  08/28/21 119/75  08/02/21 100/61  07/11/21 122/72     Assessment/Plan:   1. Impaired fasting glucose Jackie Montoya will continue Ozempic 0.5 mg weekly with no refills.   - Semaglutide,0.25 or 0.'5MG'$ /DOS, (OZEMPIC, 0.25 OR 0.5 MG/DOSE,) 2 MG/1.5ML SOPN; Inject 0.5 mg into the skin once a week.  Dispense: 4.5 mL; Refill: 0  2. Essential hypertension Jackie Montoya will continue all her medications.   3. Obesity:  Current BMI 40.27 Jackie Montoya is currently in the action stage of change. As such, her goal is to continue with weight loss efforts. She has agreed to keeping a food journal and adhering to recommended goals of 1500 calories and 100 grams of protein daily.   Exercise goals:  As is.  Behavioral modification strategies: increasing lean protein intake and decreasing simple carbohydrates.  Jackie Montoya has agreed to follow-up with our clinic in 3-4 weeks.  Objective:   Blood pressure 119/75, pulse 89, temperature 98.6 F (37 C), height '5\' 5"'$  (1.651 m), weight 242 lb (109.8 kg), last menstrual period 06/15/2002, SpO2 97 %. Body mass index is 40.27 kg/m.  General: Cooperative, alert, well developed, in no acute distress. HEENT: Conjunctivae and lids unremarkable. Cardiovascular: Regular rhythm.  Lungs: Normal work of breathing. Neurologic: No focal deficits.   Lab Results  Component Value Date   CREATININE 0.74 07/11/2021   BUN 10 07/11/2021   NA 138 07/11/2021   K 4.1 07/11/2021   CL 100 07/11/2021   CO2 26 07/11/2021   Lab Results  Component Value Date   ALT 14 07/11/2021   AST 15 07/11/2021   ALKPHOS 115 07/11/2021   BILITOT 0.5 07/11/2021   Lab Results  Component Value Date   HGBA1C 5.3 07/11/2021   HGBA1C 5.5 11/02/2020   HGBA1C 5.5 04/27/2020   HGBA1C 5.5 12/22/2019   Lab Results  Component Value Date   INSULIN 7.3 07/11/2021  INSULIN 10.2 11/02/2020   INSULIN 9.2 04/27/2020   INSULIN 11.7 12/22/2019   Lab Results  Component Value Date   TSH 1.870 12/22/2019   Lab Results  Component Value Date   CHOL 184 07/11/2021   HDL 74 07/11/2021   LDLCALC 95 07/11/2021   TRIG 80 07/11/2021   Lab Results  Component Value Date   VD25OH 64.5 07/11/2021   VD25OH 54.0 11/02/2020   VD25OH 57.3 04/27/2020   Lab Results  Component Value Date   WBC 7.0 11/29/2008   HGB 15.0 11/29/2008   HCT 44.7 11/29/2008   MCV 89.4 11/29/2008   PLT 242 11/29/2008   No results found for:  IRON, TIBC, FERRITIN  Obesity Behavioral Intervention:   Approximately 15 minutes were spent on the discussion below.  ASK: We discussed the diagnosis of obesity with Jackie Montoya today and Jackie Montoya agreed to give [place] permission to discuss obesity behavioral modification therapy today.  ASSESS: Jackie Montoya has the diagnosis of obesity and her BMI today is 40.3. Jackie Montoya is in the action stage of change.   ADVISE: Jackie Montoya was educated on the multiple health risks of obesity as well as the benefit of weight loss to improve her health. She was advised of the need for long term treatment and the importance of lifestyle modifications to improve her current health and to decrease her risk of future health problems.  AGREE: Multiple dietary modification options and treatment options were discussed and Jackie Montoya agreed to follow the recommendations documented in the above note.  ARRANGE: Jackie Montoya was educated on the importance of frequent visits to treat obesity as outlined per CMS and USPSTF guidelines and agreed to schedule her next follow up appointment today.  Attestation Statements:   Reviewed by clinician on day of visit: allergies, medications, problem list, medical history, surgical history, family history, social history, and previous encounter notes.   I, Lizbeth Bark, RMA, am acting as Location manager for [employer], [place].   I have reviewed the above documentation for accuracy and completeness, and I agree with the above. -  Georgianne Fick, FNP

## 2021-09-18 ENCOUNTER — Other Ambulatory Visit: Payer: Self-pay

## 2021-09-18 ENCOUNTER — Ambulatory Visit (INDEPENDENT_AMBULATORY_CARE_PROVIDER_SITE_OTHER): Payer: Medicare Other | Admitting: Family Medicine

## 2021-09-18 ENCOUNTER — Encounter (INDEPENDENT_AMBULATORY_CARE_PROVIDER_SITE_OTHER): Payer: Self-pay

## 2021-09-18 ENCOUNTER — Encounter (INDEPENDENT_AMBULATORY_CARE_PROVIDER_SITE_OTHER): Payer: Self-pay | Admitting: Family Medicine

## 2021-09-18 VITALS — BP 109/70 | HR 88 | Temp 98.5°F | Ht 65.0 in | Wt 241.0 lb

## 2021-09-18 DIAGNOSIS — R7301 Impaired fasting glucose: Secondary | ICD-10-CM

## 2021-09-18 DIAGNOSIS — Z6841 Body Mass Index (BMI) 40.0 and over, adult: Secondary | ICD-10-CM

## 2021-09-18 NOTE — Progress Notes (Signed)
Chief Complaint:   OBESITY Jackie Montoya is here to discuss her progress with her obesity treatment plan along with follow-up of her obesity related diagnoses. Jackie Montoya is on keeping a food journal and adhering to recommended goals of 1500 calories and 100 grams of protein and states she is following her eating plan approximately 50% of the time. Jackie Montoya states she is walking and doing Cubi for 10 minutes 4-5 times per week.  Today's visit was #: 67 Starting weight: 287 lbs Starting date: 12/22/2019 Today's weight: 241 lbs Today's date: 09/18/2021 Total lbs lost to date: 46 lbs Total lbs lost since last in-office visit: 1 ln  Interim History: Jackie Montoya recently went on vacation to the [employer]. She still lost 1 lb. She has stopped Weight Watchers and plans to start journaling. The Ozempic is really helping with her appetite and she is happy about that.   Subjective:   1. Impaired fasting glucose Jackie Montoya is on Ozempitc 0.5 mg and notes good appetite suppression. She has mild constipation. She denies nausea.   Assessment/Plan:   1. Impaired fasting glucose Twilia will continue Ozempic 0.5 mg weekly.   2. Obesity: Current BMI 40.1 Jackie Montoya is currently in the action stage of change. As such, her goal is to continue with weight loss efforts. She has agreed to keeping a food journal and adhering to recommended goals of 1500 calories and 100 grams of protein daily.  Exercise goals:  As is.  Behavioral modification strategies: increasing lean protein intake and decreasing simple carbohydrates.  Jackie Montoya has agreed to follow-up with our clinic in 3-4 weeks. Objective:   Blood pressure 109/70, pulse 88, temperature 98.5 F (36.9 C), height 5\' 5"  (1.651 m), weight 241 lb (109.3 kg), last menstrual period 06/15/2002, SpO2 98 %. Body mass index is 40.1 kg/m.  General: Cooperative, alert, well developed, in no acute distress. HEENT: Conjunctivae and lids unremarkable. Cardiovascular: Regular rhythm.   Lungs: Normal work of breathing. Neurologic: No focal deficits.   Lab Results  Component Value Date   CREATININE 0.74 07/11/2021   BUN 10 07/11/2021   NA 138 07/11/2021   K 4.1 07/11/2021   CL 100 07/11/2021   CO2 26 07/11/2021   Lab Results  Component Value Date   ALT 14 07/11/2021   AST 15 07/11/2021   ALKPHOS 115 07/11/2021   BILITOT 0.5 07/11/2021   Lab Results  Component Value Date   HGBA1C 5.3 07/11/2021   HGBA1C 5.5 11/02/2020   HGBA1C 5.5 04/27/2020   HGBA1C 5.5 12/22/2019   Lab Results  Component Value Date   INSULIN 7.3 07/11/2021   INSULIN 10.2 11/02/2020   INSULIN 9.2 04/27/2020   INSULIN 11.7 12/22/2019   Lab Results  Component Value Date   TSH 1.870 12/22/2019   Lab Results  Component Value Date   CHOL 184 07/11/2021   HDL 74 07/11/2021   LDLCALC 95 07/11/2021   TRIG 80 07/11/2021   Lab Results  Component Value Date   VD25OH 64.5 07/11/2021   VD25OH 54.0 11/02/2020   VD25OH 57.3 04/27/2020   Lab Results  Component Value Date   WBC 7.0 11/29/2008   HGB 15.0 11/29/2008   HCT 44.7 11/29/2008   MCV 89.4 11/29/2008   PLT 242 11/29/2008   No results found for: IRON, TIBC, FERRITIN  Obesity Behavioral Intervention:   Approximately 15 minutes were spent on the discussion below.  ASK: We discussed the diagnosis of obesity with Jackie Montoya today and Jackie Montoya agreed to give [place]  permission to discuss obesity behavioral modification therapy today.  ASSESS: Jackie Montoya has the diagnosis of obesity and her BMI today is 40.1 Jackie Montoya is in the action stage of change.   ADVISE: Jackie Montoya was educated on the multiple health risks of obesity as well as the benefit of weight loss to improve her health. She was advised of the need for long term treatment and the importance of lifestyle modifications to improve her current health and to decrease her risk of future health problems.  AGREE: Multiple dietary modification options and treatment options were discussed and Jackie Montoya  agreed to follow the recommendations documented in the above note.  ARRANGE: Jackie Montoya was educated on the importance of frequent visits to treat obesity as outlined per CMS and USPSTF guidelines and agreed to schedule her next follow up appointment today.  Attestation Statements:   Reviewed by clinician on day of visit: allergies, medications, problem list, medical history, surgical history, family history, social history, and previous encounter notes.  I, Lizbeth Bark, RMA, am acting as Location manager for [employer], [place].   I have reviewed the above documentation for accuracy and completeness, and I agree with the above. -  Georgianne Fick, FNP

## 2021-10-10 ENCOUNTER — Other Ambulatory Visit: Payer: Self-pay

## 2021-10-10 ENCOUNTER — Ambulatory Visit (INDEPENDENT_AMBULATORY_CARE_PROVIDER_SITE_OTHER): Payer: Medicare Other | Admitting: Family Medicine

## 2021-10-10 ENCOUNTER — Encounter (INDEPENDENT_AMBULATORY_CARE_PROVIDER_SITE_OTHER): Payer: Self-pay | Admitting: Family Medicine

## 2021-10-10 VITALS — BP 129/77 | HR 86 | Temp 98.5°F | Ht 65.0 in | Wt 241.0 lb

## 2021-10-10 DIAGNOSIS — I10 Essential (primary) hypertension: Secondary | ICD-10-CM

## 2021-10-10 DIAGNOSIS — R7301 Impaired fasting glucose: Secondary | ICD-10-CM | POA: Diagnosis not present

## 2021-10-10 NOTE — Progress Notes (Addendum)
Chief Complaint:   OBESITY Jackie Montoya is here to discuss her progress with her obesity treatment plan along with follow-up of her obesity related diagnoses. Jackie Montoya is on keeping a food journal and adhering to recommended goals of 1500 calories and 100 grams of protein and states she is following her eating plan approximately 70% of the time. Jackie Montoya states she is doing [place] for 10 minutes 5-6 times per week.  Today's visit was #: 9 Starting weight: 287 lbs Starting date: 12/22/2019 Today's weight: 241 lbs Today's date: 10/10/2021 Total lbs lost to date: 46 Total lbs lost since last in-office visit: 0  Interim History: Jackie Montoya says she bought some chocolate Halloween candy and ate too much of it.  She is journaling about 6 out of 7 days per week.  She exceeds calories about 1 days per week.  She averages 90-100 grams of protein per day.  Subjective:   1. Impaired fasting glucose She is on Ozempic 0.5 mg weekly.  She feels it is helping with her appetite.  Endorses constipation.  She is taking fiber gummies for this.  She has another 1-2 months of her Ozempic left from last refill.  2. Essential hypertension Well-controlled.  She is on amlodipine 5 mg and HCTZ 25 mg and losartan 25 mg.  BP Readings from Last 3 Encounters:  10/10/21 129/77  09/18/21 109/70  08/28/21 119/75   Assessment/Plan:   1. Impaired fasting glucose Continue Ozempic 0.5 mg weekly. Increase dose to 1 mg at next refill.  2. Essential hypertension Continue all medications.  3. Obesity: Current BMI 40.1  Jackie Montoya is currently in the action stage of change. As such, her goal is to continue with weight loss efforts. She has agreed to keeping a food journal and adhering to recommended goals of 1300-1400 calories and 90-100 grams of protein.   Exercise goals:  As is.  Behavioral modification strategies: increasing lean protein intake, decreasing simple carbohydrates, meal planning and cooking strategies, and better  snacking choices.  Jackie Montoya has agreed to follow-up with our clinic in 3-4 weeks. Objective:   Blood pressure 129/77, pulse 86, temperature 98.5 F (36.9 C), height 5\' 5"  (1.651 m), weight 241 lb (109.3 kg), last menstrual period 06/15/2002, SpO2 98 %. Body mass index is 40.1 kg/m.  General: Cooperative, alert, well developed, in no acute distress. HEENT: Conjunctivae and lids unremarkable. Cardiovascular: Regular rhythm.  Lungs: Normal work of breathing. Neurologic: No focal deficits.   Lab Results  Component Value Date   CREATININE 0.74 07/11/2021   BUN 10 07/11/2021   NA 138 07/11/2021   K 4.1 07/11/2021   CL 100 07/11/2021   CO2 26 07/11/2021   Lab Results  Component Value Date   ALT 14 07/11/2021   AST 15 07/11/2021   ALKPHOS 115 07/11/2021   BILITOT 0.5 07/11/2021   Lab Results  Component Value Date   HGBA1C 5.3 07/11/2021   HGBA1C 5.5 11/02/2020   HGBA1C 5.5 04/27/2020   HGBA1C 5.5 12/22/2019   Lab Results  Component Value Date   INSULIN 7.3 07/11/2021   INSULIN 10.2 11/02/2020   INSULIN 9.2 04/27/2020   INSULIN 11.7 12/22/2019   Lab Results  Component Value Date   TSH 1.870 12/22/2019   Lab Results  Component Value Date   CHOL 184 07/11/2021   HDL 74 07/11/2021   LDLCALC 95 07/11/2021   TRIG 80 07/11/2021   Lab Results  Component Value Date   VD25OH 64.5 07/11/2021   VD25OH 54.0  11/02/2020   VD25OH 57.3 04/27/2020   Lab Results  Component Value Date   WBC 7.0 11/29/2008   HGB 15.0 11/29/2008   HCT 44.7 11/29/2008   MCV 89.4 11/29/2008   PLT 242 11/29/2008   Attestation Statements:   Reviewed by clinician on day of visit: allergies, medications, problem list, medical history, surgical history, family history, social history, and previous encounter notes.  I, Water quality scientist, CMA, am acting as Location manager for [employer], [place].  I have reviewed the above documentation for accuracy and completeness, and I agree with the above. -  Georgianne Fick, FNP

## 2021-10-31 ENCOUNTER — Ambulatory Visit (INDEPENDENT_AMBULATORY_CARE_PROVIDER_SITE_OTHER): Payer: Medicare Other | Admitting: Family Medicine

## 2021-11-02 DIAGNOSIS — Z20828 Contact with and (suspected) exposure to other viral communicable diseases: Secondary | ICD-10-CM | POA: Diagnosis not present

## 2021-11-14 DIAGNOSIS — H52203 Unspecified astigmatism, bilateral: Secondary | ICD-10-CM | POA: Diagnosis not present

## 2021-11-14 DIAGNOSIS — H26493 Other secondary cataract, bilateral: Secondary | ICD-10-CM | POA: Diagnosis not present

## 2021-11-14 DIAGNOSIS — Z1152 Encounter for screening for COVID-19: Secondary | ICD-10-CM | POA: Diagnosis not present

## 2021-11-14 DIAGNOSIS — Z961 Presence of intraocular lens: Secondary | ICD-10-CM | POA: Diagnosis not present

## 2021-11-14 DIAGNOSIS — H353131 Nonexudative age-related macular degeneration, bilateral, early dry stage: Secondary | ICD-10-CM | POA: Diagnosis not present

## 2021-11-20 ENCOUNTER — Encounter (INDEPENDENT_AMBULATORY_CARE_PROVIDER_SITE_OTHER): Payer: Self-pay

## 2021-11-21 ENCOUNTER — Ambulatory Visit (INDEPENDENT_AMBULATORY_CARE_PROVIDER_SITE_OTHER): Payer: Medicare Other | Admitting: Family Medicine

## 2021-11-21 ENCOUNTER — Other Ambulatory Visit: Payer: Self-pay

## 2021-11-21 ENCOUNTER — Encounter (INDEPENDENT_AMBULATORY_CARE_PROVIDER_SITE_OTHER): Payer: Self-pay | Admitting: Family Medicine

## 2021-11-21 VITALS — BP 129/76 | HR 90 | Temp 98.7°F | Ht 65.0 in | Wt 240.0 lb

## 2021-11-21 DIAGNOSIS — R7301 Impaired fasting glucose: Secondary | ICD-10-CM

## 2021-11-21 DIAGNOSIS — Z6841 Body Mass Index (BMI) 40.0 and over, adult: Secondary | ICD-10-CM | POA: Diagnosis not present

## 2021-11-21 DIAGNOSIS — I10 Essential (primary) hypertension: Secondary | ICD-10-CM

## 2021-11-21 MED ORDER — SEMAGLUTIDE (1 MG/DOSE) 4 MG/3ML ~~LOC~~ SOPN: 1.0000 mg | PEN_INJECTOR | SUBCUTANEOUS | 0 refills | Status: DC

## 2021-11-21 NOTE — Progress Notes (Signed)
Chief Complaint:   OBESITY Jackie Montoya is here to discuss her progress with her obesity treatment plan along with follow-up of her obesity related diagnoses. Jackie Montoya is on keeping a food journal and adhering to recommended goals of 1300-1400 calories and 90-100 grams of protein and states she is following her eating plan approximately 75% of the time. Jackie Montoya states she is doing [place] for 10-15 minutes 5-6 times per week.  Today's visit was #: 60 Starting weight: 287 lbs Starting date: 12/22/2019 Today's weight: 240 lbs Today's date: 11/21/2021 Total lbs lost to date: 47 lbs Total lbs lost since last in-office visit: 1 lb  Interim History: Jackie Montoya's last office visit was 09/30/2021. She has lost 1 lb since then. She journals about 5 days per week. She averages 1400-1500 calories per day and 90 grams of protein per day.  Subjective:   1. Impaired fasting glucose Jackie Montoya is currently on Ozempic 0.5 mg weekly. She notes increased carb cravings recently.  SE: mild constipation.  2. Essential hypertension Jackie Montoya's hypertension is controlled. She is currently on Norvasc, Losartan and HCTZ.  BP Readings from Last 3 Encounters:  11/21/21 129/76  10/10/21 129/77  09/18/21 109/70     Assessment/Plan:   1. Impaired fasting glucose Jackie Montoya agrees to increase dose Ozempic. We will refill Ozempic 1 mg weekly with no refills.  - Semaglutide, 1 MG/DOSE, 4 MG/3ML SOPN; Inject 1 mg as directed once a week.  Dispense: 9 mL; Refill: 0  2. Essential hypertension Jackie Montoya will continue all medications.   3. Obesity: Current BMI 39.94 Jackie Montoya is currently in the action stage of change. As such, her goal is to continue with weight loss efforts. She has agreed to keeping a food journal and adhering to recommended goals of 1300-1400 calories and 90-100 grams of protein daily.  Exercise goals:  As is.  Behavioral modification strategies: holiday eating strategies  and keeping a strict food journal.  Shanik has  agreed to follow-up with our clinic in 4-5 weeks.  Objective:   Blood pressure 129/76, pulse 90, temperature 98.7 F (37.1 C), height 5\' 5"  (1.651 m), weight 240 lb (108.9 kg), last menstrual period 06/15/2002, SpO2 90 %. Body mass index is 39.94 kg/m.  General: Cooperative, alert, well developed, in no acute distress. HEENT: Conjunctivae and lids unremarkable. Cardiovascular: Regular rhythm.  Lungs: Normal work of breathing. Neurologic: No focal deficits.   Lab Results  Component Value Date   CREATININE 0.74 07/11/2021   BUN 10 07/11/2021   NA 138 07/11/2021   K 4.1 07/11/2021   CL 100 07/11/2021   CO2 26 07/11/2021   Lab Results  Component Value Date   ALT 14 07/11/2021   AST 15 07/11/2021   ALKPHOS 115 07/11/2021   BILITOT 0.5 07/11/2021   Lab Results  Component Value Date   HGBA1C 5.3 07/11/2021   HGBA1C 5.5 11/02/2020   HGBA1C 5.5 04/27/2020   HGBA1C 5.5 12/22/2019   Lab Results  Component Value Date   INSULIN 7.3 07/11/2021   INSULIN 10.2 11/02/2020   INSULIN 9.2 04/27/2020   INSULIN 11.7 12/22/2019   Lab Results  Component Value Date   TSH 1.870 12/22/2019   Lab Results  Component Value Date   CHOL 184 07/11/2021   HDL 74 07/11/2021   LDLCALC 95 07/11/2021   TRIG 80 07/11/2021   Lab Results  Component Value Date   VD25OH 64.5 07/11/2021   VD25OH 54.0 11/02/2020   VD25OH 57.3 04/27/2020   Lab Results  Component Value Date   WBC 7.0 11/29/2008   HGB 15.0 11/29/2008   HCT 44.7 11/29/2008   MCV 89.4 11/29/2008   PLT 242 11/29/2008   No results found for: IRON, TIBC, FERRITIN  Attestation Statements:   Reviewed by clinician on day of visit: allergies, medications, problem list, medical history, surgical history, family history, social history, and previous encounter notes.  I, Lizbeth Bark, RMA, am acting as Location manager for [employer], [place].   I have reviewed the above documentation for accuracy and completeness, and I agree with  the above. -  Georgianne Fick, FNP

## 2021-11-22 DIAGNOSIS — R059 Cough, unspecified: Secondary | ICD-10-CM | POA: Diagnosis not present

## 2021-11-22 DIAGNOSIS — G4733 Obstructive sleep apnea (adult) (pediatric): Secondary | ICD-10-CM | POA: Diagnosis not present

## 2021-11-22 DIAGNOSIS — I10 Essential (primary) hypertension: Secondary | ICD-10-CM | POA: Diagnosis not present

## 2021-11-22 DIAGNOSIS — J111 Influenza due to unidentified influenza virus with other respiratory manifestations: Secondary | ICD-10-CM | POA: Diagnosis not present

## 2021-11-22 DIAGNOSIS — Z03818 Encounter for observation for suspected exposure to other biological agents ruled out: Secondary | ICD-10-CM | POA: Diagnosis not present

## 2021-11-22 DIAGNOSIS — J45909 Unspecified asthma, uncomplicated: Secondary | ICD-10-CM | POA: Diagnosis not present

## 2021-11-22 DIAGNOSIS — R7303 Prediabetes: Secondary | ICD-10-CM | POA: Diagnosis not present

## 2021-12-10 DIAGNOSIS — Z20822 Contact with and (suspected) exposure to covid-19: Secondary | ICD-10-CM | POA: Diagnosis not present

## 2021-12-14 DIAGNOSIS — Z1152 Encounter for screening for COVID-19: Secondary | ICD-10-CM | POA: Diagnosis not present

## 2021-12-25 ENCOUNTER — Other Ambulatory Visit: Payer: Self-pay

## 2021-12-25 ENCOUNTER — Ambulatory Visit (INDEPENDENT_AMBULATORY_CARE_PROVIDER_SITE_OTHER): Payer: Medicare Other | Admitting: Family Medicine

## 2021-12-25 ENCOUNTER — Encounter (INDEPENDENT_AMBULATORY_CARE_PROVIDER_SITE_OTHER): Payer: Self-pay | Admitting: Family Medicine

## 2021-12-25 VITALS — BP 131/70 | HR 80 | Temp 98.2°F | Ht 65.0 in | Wt 236.0 lb

## 2021-12-25 DIAGNOSIS — Z6839 Body mass index (BMI) 39.0-39.9, adult: Secondary | ICD-10-CM

## 2021-12-25 DIAGNOSIS — R7301 Impaired fasting glucose: Secondary | ICD-10-CM

## 2021-12-25 DIAGNOSIS — Z6841 Body Mass Index (BMI) 40.0 and over, adult: Secondary | ICD-10-CM

## 2021-12-25 NOTE — Progress Notes (Signed)
Chief Complaint:   OBESITY Jackie Montoya is here to discuss her progress with her obesity treatment plan along with follow-up of her obesity related diagnoses. Jackie Montoya is on keeping a food journal and adhering to recommended goals of 1300-1400 calories and 90-100 grams of protein and states she is following her eating plan approximately 50% of the time. Jackie Montoya states she is doing [place] for 15 minutes 1 times per week.  Today's visit was #: 93 Starting weight: 287 lbs Starting date: 12/22/2019 Today's weight: 236 lbs Today's date: 12/25/2021 Total lbs lost to date: 51 lbs Total lbs lost since last in-office visit: 4 lbs  Interim History: Jackie Montoya is not journaling consistently. She had the flu recently and was off plan and then was off plan over the holidays. However, she has lost 4 lbs.   Subjective:   1. Impaired fasting glucose Jackie Montoya is on Ozempic 1 mg weekly. We increased dose to 1 mg last office visit. She notes dysgeusia and she says she may want to decrease dose due to this side effect.   Assessment/Plan:   1. Impaired fasting glucose Jackie Montoya will continue Ozempic 1 mg. She was advised how to titrate to 0.5 mg dose ( 37 clicks) with the 1 mg pen.   2. Obesity: Current BMI 39.27 Jackie Montoya is currently in the action stage of change. As such, her goal is to continue with weight loss efforts. She has agreed to keeping a food journal and adhering to recommended goals of 1300-1400 calories and 90-100 grams of protein daily.   Exercise goals:  As is.  Behavioral modification strategies: keeping a strict food journal.  Jackie Montoya has agreed to follow-up with our clinic in 4 weeks.  Objective:   Blood pressure 131/70, pulse 80, temperature 98.2 F (36.8 C), height 5\' 5"  (1.651 m), weight 236 lb (107 kg), last menstrual period 06/15/2002, SpO2 97 %. Body mass index is 39.27 kg/m.  General: Cooperative, alert, well developed, in no acute distress. HEENT: Conjunctivae and lids  unremarkable. Cardiovascular: Regular rhythm.  Lungs: Normal work of breathing. Neurologic: No focal deficits.   Lab Results  Component Value Date   CREATININE 0.74 07/11/2021   BUN 10 07/11/2021   NA 138 07/11/2021   K 4.1 07/11/2021   CL 100 07/11/2021   CO2 26 07/11/2021   Lab Results  Component Value Date   ALT 14 07/11/2021   AST 15 07/11/2021   ALKPHOS 115 07/11/2021   BILITOT 0.5 07/11/2021   Lab Results  Component Value Date   HGBA1C 5.3 07/11/2021   HGBA1C 5.5 11/02/2020   HGBA1C 5.5 04/27/2020   HGBA1C 5.5 12/22/2019   Lab Results  Component Value Date   INSULIN 7.3 07/11/2021   INSULIN 10.2 11/02/2020   INSULIN 9.2 04/27/2020   INSULIN 11.7 12/22/2019   Lab Results  Component Value Date   TSH 1.870 12/22/2019   Lab Results  Component Value Date   CHOL 184 07/11/2021   HDL 74 07/11/2021   LDLCALC 95 07/11/2021   TRIG 80 07/11/2021   Lab Results  Component Value Date   VD25OH 64.5 07/11/2021   VD25OH 54.0 11/02/2020   VD25OH 57.3 04/27/2020   Lab Results  Component Value Date   WBC 7.0 11/29/2008   HGB 15.0 11/29/2008   HCT 44.7 11/29/2008   MCV 89.4 11/29/2008   PLT 242 11/29/2008   No results found for: IRON, TIBC, FERRITIN  Attestation Statements:   Reviewed by clinician on day of visit: allergies,  medications, problem list, medical history, surgical history, family history, social history, and previous encounter notes.  I, Lizbeth Bark, RMA, am acting as Location manager for [employer], [place].  I have reviewed the above documentation for accuracy and completeness, and I agree with the above. -  Georgianne Fick, FNP

## 2022-01-22 ENCOUNTER — Other Ambulatory Visit: Payer: Self-pay

## 2022-01-22 ENCOUNTER — Encounter (INDEPENDENT_AMBULATORY_CARE_PROVIDER_SITE_OTHER): Payer: Self-pay | Admitting: Family Medicine

## 2022-01-22 ENCOUNTER — Ambulatory Visit (INDEPENDENT_AMBULATORY_CARE_PROVIDER_SITE_OTHER): Payer: Medicare Other | Admitting: Family Medicine

## 2022-01-22 VITALS — BP 108/69 | HR 83 | Temp 98.8°F | Ht 65.0 in | Wt 235.0 lb

## 2022-01-22 DIAGNOSIS — R7301 Impaired fasting glucose: Secondary | ICD-10-CM

## 2022-01-22 DIAGNOSIS — Z6839 Body mass index (BMI) 39.0-39.9, adult: Secondary | ICD-10-CM

## 2022-01-22 DIAGNOSIS — E782 Mixed hyperlipidemia: Secondary | ICD-10-CM

## 2022-01-22 DIAGNOSIS — E559 Vitamin D deficiency, unspecified: Secondary | ICD-10-CM | POA: Diagnosis not present

## 2022-01-22 DIAGNOSIS — E669 Obesity, unspecified: Secondary | ICD-10-CM

## 2022-01-22 MED ORDER — SEMAGLUTIDE (1 MG/DOSE) 4 MG/3ML ~~LOC~~ SOPN: 1.0000 mg | PEN_INJECTOR | SUBCUTANEOUS | 0 refills | Status: DC

## 2022-01-22 NOTE — Progress Notes (Signed)
Chief Complaint:   OBESITY Jackie Montoya is here to discuss her progress with her obesity treatment plan along with follow-up of her obesity related diagnoses. Jackie Montoya is on keeping a food journal and adhering to recommended goals of 1300-1400 calories and 90-100 grams of protein and states she is following her eating plan approximately 85% of the time. Jackie Montoya states she is [place] and stretch bands for 10 minutes 3-4 times per week.  Today's visit was #: 1 Starting weight: 287 lbs Starting date: 12/22/2019 Today's weight: 235 lbs Today's date: 01/22/2022 Total lbs lost to date: 52 lbs Total lbs lost since last in-office visit: 1 lb  Interim History: Jackie Montoya started at our clinic in January 2021 at 297 pounds.  She lost weight steadily for 10 months and then plateaued around 245.  She was plateaued at 245 for almost a year but then started losing again around September 2022.  She has lost a total of 52 pounds.   Jackie Montoya says she has been eating chocolate that her husband bought. She is journaling 4-5 days per week. She sometimes exceeds calorie goal by 100 calories. She feels she is doing better with protein intake recently.  Subjective:   1. Impaired fasting glucose [name] is on Ozempic 1 mg. Her appetite is well controlled. She denies nausea. She notes occasional constipation.   2. Vitamin D deficiency Jackie Montoya is on 5,000 UT Vitamin D over the counter. Her Vitamin D is at goal (64.5).  Lab Results  Component Value Date   VD25OH 64.5 07/11/2021   VD25OH 54.0 11/02/2020   VD25OH 57.3 04/27/2020    3. Mixed hyperlipidemia Jackie Montoya's last LDL was at goal (95). Her HDL and Triglycerides was within normal limits. She is on Lipitor 20 mg daily.  Lab Results  Component Value Date   CHOL 184 07/11/2021   HDL 74 07/11/2021   LDLCALC 95 07/11/2021   TRIG 80 07/11/2021   Lab Results  Component Value Date   ALT 14 07/11/2021   AST 15 07/11/2021   ALKPHOS 115 07/11/2021   BILITOT 0.5 07/11/2021    The 10-year ASCVD risk score (Arnett DK, et al., 2019) is: 8.4%   Values used to calculate the score:     Age: 71 years     Sex: Female     Is Non-Hispanic African American: No     Diabetic: No     Tobacco smoker: No     Systolic Blood Pressure: 283 mmHg     Is BP treated: Yes     HDL Cholesterol: 74 mg/dL     Total Cholesterol: 184 mg/dL  Assessment/Plan:   1. Impaired fasting glucose We will refill Ozempic 1 mg weekly. We will check labs today.   - Comprehensive metabolic panel - Hemoglobin A1c - Insulin, random - Semaglutide, 1 MG/DOSE, 4 MG/3ML SOPN; Inject 1 mg as directed once a week.  Dispense: 9 mL; Refill: 0  2. Vitamin D deficiency Jackie Montoya agrees to continue to take over the counter Vitamin D 5,000 IU every day and she will follow-up for routine testing of Vitamin D, at least 2-3 times per year to avoid over-replacement. We will check labs today.   - VITAMIN D 25 Hydroxy (Vit-D Deficiency, Fractures)  3. Mixed hyperlipidemia  Jackie Montoya will continue Lipitor. We will check labs today. We discussed several lifestyle modifications today and Jackie Montoya will continue to work on diet, exercise and weight loss efforts.   - Lipid Panel With LDL/HDL Ratio  4.  Obesity: Current BMI 39.11 Jackie Montoya is currently in the action stage of change. As such, her goal is to continue with weight loss efforts. She has agreed to keeping a food journal and adhering to recommended goals of 1300-1400 calories and 90-100 grams of protein daily.  Jackie Montoya will journal 7 days per week including weekends.  Exercise goals:  As is.   Behavioral modification strategies: decreasing simple carbohydrates, emotional eating strategies, and keeping a strict food journal.  Jackie Montoya has agreed to follow-up with our clinic in 4 weeks.  Objective:   Blood pressure 108/69, pulse 83, temperature 98.8 F (37.1 C), height 5\' 5"  (1.651 m), weight 235 lb (106.6 kg), last menstrual period 06/15/2002, SpO2 97 %. Body mass index  is 39.11 kg/m.  General: Cooperative, alert, well developed, in no acute distress. HEENT: Conjunctivae and lids unremarkable. Cardiovascular: Regular rhythm.  Lungs: Normal work of breathing. Neurologic: No focal deficits.   Lab Results  Component Value Date   CREATININE 0.74 07/11/2021   BUN 10 07/11/2021   NA 138 07/11/2021   K 4.1 07/11/2021   CL 100 07/11/2021   CO2 26 07/11/2021   Lab Results  Component Value Date   ALT 14 07/11/2021   AST 15 07/11/2021   ALKPHOS 115 07/11/2021   BILITOT 0.5 07/11/2021   Lab Results  Component Value Date   HGBA1C 5.3 07/11/2021   HGBA1C 5.5 11/02/2020   HGBA1C 5.5 04/27/2020   HGBA1C 5.5 12/22/2019   Lab Results  Component Value Date   INSULIN 7.3 07/11/2021   INSULIN 10.2 11/02/2020   INSULIN 9.2 04/27/2020   INSULIN 11.7 12/22/2019   Lab Results  Component Value Date   TSH 1.870 12/22/2019   Lab Results  Component Value Date   CHOL 184 07/11/2021   HDL 74 07/11/2021   LDLCALC 95 07/11/2021   TRIG 80 07/11/2021   Lab Results  Component Value Date   VD25OH 64.5 07/11/2021   VD25OH 54.0 11/02/2020   VD25OH 57.3 04/27/2020   Lab Results  Component Value Date   WBC 7.0 11/29/2008   HGB 15.0 11/29/2008   HCT 44.7 11/29/2008   MCV 89.4 11/29/2008   PLT 242 11/29/2008   No results found for: IRON, TIBC, FERRITIN  Attestation Statements:   Reviewed by clinician on day of visit: allergies, medications, problem list, medical history, surgical history, family history, social history, and previous encounter notes.  I, Lizbeth Bark, RMA, am acting as Location manager for [employer], [place].  I have reviewed the above documentation for accuracy and completeness, and I agree with the above. -  Georgianne Fick, FNP

## 2022-01-23 LAB — LIPID PANEL WITH LDL/HDL RATIO
Cholesterol, Total: 181 mg/dL (ref 100–199)
HDL: 73 mg/dL (ref >39)
LDL Chol Calc (NIH): 89 mg/dL (ref 0–99)
LDL/HDL Ratio: 1.2 ratio (ref 0.0–3.2)
Triglycerides: 109 mg/dL (ref 0–149)
VLDL Cholesterol Cal: 19 mg/dL (ref 5–40)

## 2022-01-23 LAB — COMPREHENSIVE METABOLIC PANEL
ALT: 13 IU/L (ref 0–32)
AST: 20 IU/L (ref 0–40)
Albumin/Globulin Ratio: 2.5 — ABNORMAL HIGH (ref 1.2–2.2)
Albumin: 4.8 g/dL (ref 3.8–4.8)
Alkaline Phosphatase: 104 IU/L (ref 44–121)
BUN/Creatinine Ratio: 18 (ref 12–28)
BUN: 14 mg/dL (ref 8–27)
Bilirubin Total: 0.8 mg/dL (ref 0.0–1.2)
CO2: 24 mmol/L (ref 20–29)
Calcium: 9.7 mg/dL (ref 8.7–10.3)
Chloride: 97 mmol/L (ref 96–106)
Creatinine, Ser: 0.79 mg/dL (ref 0.57–1.00)
Globulin, Total: 1.9 g/dL (ref 1.5–4.5)
Glucose: 90 mg/dL (ref 70–99)
Potassium: 3.9 mmol/L (ref 3.5–5.2)
Sodium: 139 mmol/L (ref 134–144)
Total Protein: 6.7 g/dL (ref 6.0–8.5)
eGFR: 80 mL/min/{1.73_m2} (ref >59)

## 2022-01-23 LAB — HEMOGLOBIN A1C
Est. average glucose Bld gHb Est-mCnc: 105 mg/dL
Hgb A1c MFr Bld: 5.3 % (ref 4.8–5.6)

## 2022-01-23 LAB — INSULIN, RANDOM: INSULIN: 11.6 u[IU]/mL (ref 2.6–24.9)

## 2022-01-23 LAB — VITAMIN D 25 HYDROXY (VIT D DEFICIENCY, FRACTURES): Vit D, 25-Hydroxy: 57.5 ng/mL (ref 30.0–100.0)

## 2022-02-05 DIAGNOSIS — Z20822 Contact with and (suspected) exposure to covid-19: Secondary | ICD-10-CM | POA: Diagnosis not present

## 2022-02-12 ENCOUNTER — Ambulatory Visit (INDEPENDENT_AMBULATORY_CARE_PROVIDER_SITE_OTHER): Payer: Medicare Other | Admitting: Bariatrics

## 2022-02-12 ENCOUNTER — Encounter (INDEPENDENT_AMBULATORY_CARE_PROVIDER_SITE_OTHER): Payer: Self-pay | Admitting: Bariatrics

## 2022-02-12 ENCOUNTER — Other Ambulatory Visit: Payer: Self-pay

## 2022-02-12 VITALS — BP 143/84 | HR 90 | Temp 98.1°F | Ht 65.0 in | Wt 229.0 lb

## 2022-02-12 DIAGNOSIS — E782 Mixed hyperlipidemia: Secondary | ICD-10-CM

## 2022-02-12 DIAGNOSIS — E669 Obesity, unspecified: Secondary | ICD-10-CM | POA: Diagnosis not present

## 2022-02-12 DIAGNOSIS — Z6841 Body Mass Index (BMI) 40.0 and over, adult: Secondary | ICD-10-CM

## 2022-02-12 DIAGNOSIS — Z6838 Body mass index (BMI) 38.0-38.9, adult: Secondary | ICD-10-CM | POA: Diagnosis not present

## 2022-02-12 DIAGNOSIS — I10 Essential (primary) hypertension: Secondary | ICD-10-CM

## 2022-02-12 NOTE — Progress Notes (Signed)
Chief Complaint:   OBESITY Jackie Montoya is here to discuss her progress with her obesity treatment plan along with follow-up of her obesity related diagnoses. Jackie Montoya is on keeping a food journal and adhering to recommended goals of 1300 calories and 90-100 grams of protein and states she is following her eating plan approximately 90% of the time. Jackie Montoya states she is doing [place] for 10 minutes 6 times per week and bands for 5-10 minutes 3-4 times per week.  Today's visit was #: 49 Starting weight: 287 lbs Starting date: 12/22/2019 Today's weight: 229 lbs Today's date: 02/12/2022 Total lbs lost to date: 58 lbs Total lbs lost since last in-office visit: 6 lbs  Interim History: Jackie Montoya is down another 6 lbs since her last visit. She is taking Ozempic which helped with her appetite.   Subjective:   1. Mixed hyperlipidemia Jackie Montoya is taking Lipitor currently.   2. Essential hypertension Jackie Montoya is taking HCTZ, Cozaar and Norvasc currently. Her blood pressure is fairly controlled. Her last blood pressure was 108/69.  Assessment/Plan:   1. Mixed hyperlipidemia Cardiovascular risk and specific lipid/LDL goals reviewed.  Swayzie will continue taking Lipitor. We discussed several lifestyle modifications today and Jackie Montoya will continue to work on diet, exercise and weight loss efforts. Orders and follow up as documented in patient record.   Counseling Intensive lifestyle modifications are the first line treatment for this issue. Dietary changes: Increase soluble fiber. Decrease simple carbohydrates. Exercise changes: Moderate to vigorous-intensity aerobic activity 150 minutes per week if tolerated. Lipid-lowering medications: see documented in medical record.  2. Essential hypertension Jackie Montoya will continue taking HCTZ, Cozaar and Norvasc. She is working on healthy weight loss and exercise to improve blood pressure control. We will watch for signs of hypotension as she continues her lifestyle  modifications.  3. Obesity: Current BMI 38.2 Jackie Montoya is currently in the action stage of change. As such, her goal is to continue with weight loss efforts. She has agreed to keeping a food journal and adhering to recommended goals of 1300 calories and 90-100 grams of protein.   Jackie Montoya will continue meal planning and she will continue to adhere closely to the plan 80-90%. We reviewed labs from 01/22/2022 CMP, Lipids, Vitamin D, A1C and insulin.   Exercise goals:  As is. Shavone will begin walking.   Behavioral modification strategies: increasing lean protein intake, decreasing simple carbohydrates, increasing vegetables, increasing water intake, decreasing eating out, no skipping meals, meal planning and cooking strategies, keeping healthy foods in the home, and planning for success.  Lakima has agreed to follow-up with our clinic in 4 weeks with myself or Jackie Marble, NP. She was informed of the importance of frequent follow-up visits to maximize her success with intensive lifestyle modifications for her multiple health conditions.   Objective:   Blood pressure (!) 143/84, pulse 90, temperature 98.1 F (36.7 C), height 5\' 5"  (1.651 m), weight 229 lb (103.9 kg), last menstrual period 06/15/2002, SpO2 95 %. Body mass index is 38.11 kg/m.  General: Cooperative, alert, well developed, in no acute distress. HEENT: Conjunctivae and lids unremarkable. Cardiovascular: Regular rhythm.  Lungs: Normal work of breathing. Neurologic: No focal deficits.   Lab Results  Component Value Date   CREATININE 0.79 01/22/2022   BUN 14 01/22/2022   NA 139 01/22/2022   K 3.9 01/22/2022   CL 97 01/22/2022   CO2 24 01/22/2022   Lab Results  Component Value Date   ALT 13 01/22/2022   AST 20 01/22/2022  ALKPHOS 104 01/22/2022   BILITOT 0.8 01/22/2022   Lab Results  Component Value Date   HGBA1C 5.3 01/22/2022   HGBA1C 5.3 07/11/2021   HGBA1C 5.5 11/02/2020   HGBA1C 5.5 04/27/2020   HGBA1C 5.5 12/22/2019    Lab Results  Component Value Date   INSULIN 11.6 01/22/2022   INSULIN 7.3 07/11/2021   INSULIN 10.2 11/02/2020   INSULIN 9.2 04/27/2020   INSULIN 11.7 12/22/2019   Lab Results  Component Value Date   TSH 1.870 12/22/2019   Lab Results  Component Value Date   CHOL 181 01/22/2022   HDL 73 01/22/2022   LDLCALC 89 01/22/2022   TRIG 109 01/22/2022   Lab Results  Component Value Date   VD25OH 57.5 01/22/2022   VD25OH 64.5 07/11/2021   VD25OH 54.0 11/02/2020   Lab Results  Component Value Date   WBC 7.0 11/29/2008   HGB 15.0 11/29/2008   HCT 44.7 11/29/2008   MCV 89.4 11/29/2008   PLT 242 11/29/2008   No results found for: IRON, TIBC, FERRITIN  Attestation Statements:   Reviewed by clinician on day of visit: allergies, medications, problem list, medical history, surgical history, family history, social history, and previous encounter notes.  I, Lizbeth Bark, RMA, am acting as Location manager for [employer], [place].  I have reviewed the above documentation for accuracy and completeness, and I agree with the above. Jearld Lesch, DO

## 2022-02-13 ENCOUNTER — Encounter (INDEPENDENT_AMBULATORY_CARE_PROVIDER_SITE_OTHER): Payer: Self-pay | Admitting: Bariatrics

## 2022-02-13 DIAGNOSIS — J449 Chronic obstructive pulmonary disease, unspecified: Secondary | ICD-10-CM | POA: Diagnosis not present

## 2022-02-13 DIAGNOSIS — E782 Mixed hyperlipidemia: Secondary | ICD-10-CM | POA: Diagnosis not present

## 2022-02-13 DIAGNOSIS — Z6841 Body Mass Index (BMI) 40.0 and over, adult: Secondary | ICD-10-CM | POA: Diagnosis not present

## 2022-02-13 DIAGNOSIS — R7303 Prediabetes: Secondary | ICD-10-CM | POA: Diagnosis not present

## 2022-02-13 DIAGNOSIS — I10 Essential (primary) hypertension: Secondary | ICD-10-CM | POA: Diagnosis not present

## 2022-03-04 DIAGNOSIS — Z20822 Contact with and (suspected) exposure to covid-19: Secondary | ICD-10-CM | POA: Diagnosis not present

## 2022-03-05 DIAGNOSIS — Z20822 Contact with and (suspected) exposure to covid-19: Secondary | ICD-10-CM | POA: Diagnosis not present

## 2022-03-13 ENCOUNTER — Ambulatory Visit (INDEPENDENT_AMBULATORY_CARE_PROVIDER_SITE_OTHER): Payer: Medicare Other | Admitting: Adult Health

## 2022-03-14 ENCOUNTER — Ambulatory Visit (INDEPENDENT_AMBULATORY_CARE_PROVIDER_SITE_OTHER): Payer: Medicare Other | Admitting: Adult Health

## 2022-03-19 ENCOUNTER — Encounter (INDEPENDENT_AMBULATORY_CARE_PROVIDER_SITE_OTHER): Payer: Self-pay

## 2022-03-19 ENCOUNTER — Ambulatory Visit (INDEPENDENT_AMBULATORY_CARE_PROVIDER_SITE_OTHER): Payer: Medicare Other | Admitting: Family Medicine

## 2022-03-21 ENCOUNTER — Ambulatory Visit (INDEPENDENT_AMBULATORY_CARE_PROVIDER_SITE_OTHER): Payer: Medicare Other | Admitting: Adult Health

## 2022-03-21 ENCOUNTER — Encounter (INDEPENDENT_AMBULATORY_CARE_PROVIDER_SITE_OTHER): Payer: Self-pay | Admitting: Adult Health

## 2022-03-21 VITALS — BP 144/78 | HR 81 | Temp 98.3°F | Ht 65.0 in | Wt 232.0 lb

## 2022-03-21 DIAGNOSIS — E669 Obesity, unspecified: Secondary | ICD-10-CM

## 2022-03-21 DIAGNOSIS — Z20828 Contact with and (suspected) exposure to other viral communicable diseases: Secondary | ICD-10-CM | POA: Diagnosis not present

## 2022-03-21 DIAGNOSIS — Z6841 Body Mass Index (BMI) 40.0 and over, adult: Secondary | ICD-10-CM

## 2022-03-21 DIAGNOSIS — I10 Essential (primary) hypertension: Secondary | ICD-10-CM

## 2022-03-21 DIAGNOSIS — Z6838 Body mass index (BMI) 38.0-38.9, adult: Secondary | ICD-10-CM | POA: Diagnosis not present

## 2022-03-21 DIAGNOSIS — R7301 Impaired fasting glucose: Secondary | ICD-10-CM | POA: Diagnosis not present

## 2022-03-21 MED ORDER — SEMAGLUTIDE (1 MG/DOSE) 4 MG/3ML ~~LOC~~ SOPN: 1.0000 mg | PEN_INJECTOR | SUBCUTANEOUS | 0 refills | Status: DC

## 2022-03-22 DIAGNOSIS — Z20822 Contact with and (suspected) exposure to covid-19: Secondary | ICD-10-CM | POA: Diagnosis not present

## 2022-03-26 DIAGNOSIS — Z20828 Contact with and (suspected) exposure to other viral communicable diseases: Secondary | ICD-10-CM | POA: Diagnosis not present

## 2022-03-27 NOTE — Progress Notes (Signed)
? ? ? ?Chief Complaint:  ? ?OBESITY ?Jackie Montoya is here to discuss her progress with her obesity treatment plan along with follow-up of her obesity related diagnoses. Jackie Montoya is on keeping a food journal and adhering to recommended goals of 1300 calories and 90-100 grams of protein and states she is following her eating plan approximately 50% of the time. Jackie Montoya states she is doing cardio for 15 minutes 2 times per week. ? ?Today's visit was #: 36 ?Starting weight: 287 lbs ?Starting date: 12/22/2019 ?Today's weight: 232 lbs ?Today's date: 03/21/2022 ?Total lbs lost to date: 55 lbs ?Total lbs lost since last in-office visit: 0 ? ?Interim History:  ?Jackie Montoya lives in a 1700 square foot home - 2 bedrooms/2 bathrooms. ?Her 59 year old son lives with her and her husband. ? ?Subjective:  ? ?1. Essential hypertension ?She is on Norvasc 5 mg daily, HCTZ 25 mg daily, Cozaar 25 mg daily. ?She denies acute cardiac symptoms. ? ?2. Impaired fasting glucose ?She was started on Ozempic 0.'25mg'$  06/20/21. ?Ozempic increased from 0.'5mg'$  to '1mg'$  11/21/21. ?She denies mass in neck, dysphagia, dyspepsia, persistent hoarseness, abd pain, or N/V/Constipation. ? ?Assessment/Plan:  ? ?1. Essential hypertension ?Continue current antihypertensive regimen. ? ?2. Impaired fasting glucose ?Refill Ozempic 1 mg once weekly. ? ?- Refill Semaglutide, 1 MG/DOSE, 4 MG/3ML SOPN; Inject 1 mg as directed once a week.  Dispense: 9 mL; Refill: 0 ? ?3. Obesity: Current BMI 38.6 ? ?Jackie Montoya is currently in the action stage of change. As such, her goal is to continue with weight loss efforts. She has agreed to keeping a food journal and adhering to recommended goals of 1300 calories and 90-100 grams of protein.  ? ?Exercise goals:  As is. ? ?Behavioral modification strategies: increasing lean protein intake, decreasing simple carbohydrates, meal planning and cooking strategies, keeping healthy foods in the home, and planning for success. ? ?Jackie Montoya has agreed to follow-up with our  clinic in 5 weeks. She was informed of the importance of frequent follow-up visits to maximize her success with intensive lifestyle modifications for her multiple health conditions.  ? ?Objective:  ? ?Blood pressure (!) 144/78, pulse 81, temperature 98.3 ?F (36.8 ?C), height '5\' 5"'$  (1.651 m), weight 232 lb (105.2 kg), last menstrual period 06/15/2002, SpO2 97 %. ?Body mass index is 38.61 kg/m?. ? ?General: Cooperative, alert, well developed, in no acute distress. ?HEENT: Conjunctivae and lids unremarkable. ?Cardiovascular: Regular rhythm.  ?Lungs: Normal work of breathing. ?Neurologic: No focal deficits.  ? ?Lab Results  ?Component Value Date  ? CREATININE 0.79 01/22/2022  ? BUN 14 01/22/2022  ? NA 139 01/22/2022  ? K 3.9 01/22/2022  ? CL 97 01/22/2022  ? CO2 24 01/22/2022  ? ?Lab Results  ?Component Value Date  ? ALT 13 01/22/2022  ? AST 20 01/22/2022  ? ALKPHOS 104 01/22/2022  ? BILITOT 0.8 01/22/2022  ? ?Lab Results  ?Component Value Date  ? HGBA1C 5.3 01/22/2022  ? HGBA1C 5.3 07/11/2021  ? HGBA1C 5.5 11/02/2020  ? HGBA1C 5.5 04/27/2020  ? HGBA1C 5.5 12/22/2019  ? ?Lab Results  ?Component Value Date  ? INSULIN 11.6 01/22/2022  ? INSULIN 7.3 07/11/2021  ? INSULIN 10.2 11/02/2020  ? INSULIN 9.2 04/27/2020  ? INSULIN 11.7 12/22/2019  ? ?Lab Results  ?Component Value Date  ? TSH 1.870 12/22/2019  ? ?Lab Results  ?Component Value Date  ? CHOL 181 01/22/2022  ? HDL 73 01/22/2022  ? [place] 89 01/22/2022  ? TRIG 109 01/22/2022  ? ?Lab Results  ?  Component Value Date  ? VD25OH 57.5 01/22/2022  ? VD25OH 64.5 07/11/2021  ? VD25OH 54.0 11/02/2020  ? ?Lab Results  ?Component Value Date  ? WBC 7.0 11/29/2008  ? HGB 15.0 11/29/2008  ? HCT 44.7 11/29/2008  ? MCV 89.4 11/29/2008  ? PLT 242 11/29/2008  ? ?Obesity Behavioral Intervention:  ? ?Approximately 15 minutes were spent on the discussion below. ? ?ASK: ?We discussed the diagnosis of obesity with Jackie Montoya today and Jackie Montoya agreed to give [place] permission to discuss obesity behavioral  modification therapy today. ? ?ASSESS: ?Jackie Montoya has the diagnosis of obesity and her BMI today is 38.6. Jackie Montoya is in the action stage of change.  ? ?ADVISE: ?Jackie Montoya was educated on the multiple health risks of obesity as well as the benefit of weight loss to improve her health. She was advised of the need for long term treatment and the importance of lifestyle modifications to improve her current health and to decrease her risk of future health problems. ? ?AGREE: ?Multiple dietary modification options and treatment options were discussed and Jackie Montoya agreed to follow the recommendations documented in the above note. ? ?ARRANGE: ?Jackie Montoya was educated on the importance of frequent visits to treat obesity as outlined per CMS and USPSTF guidelines and agreed to schedule her next follow up appointment today. ? ?Attestation Statements:  ? ?Reviewed by clinician on day of visit: allergies, medications, problem list, medical history, surgical history, family history, social history, and previous encounter notes. ? ?I, Water quality scientist, CMA, am acting as Location manager for Jackie Marble, NP. ? ?I have reviewed the above documentation for accuracy and completeness, and I agree with the above. -  Jackie Fouche d. Jackie Mcmasters, NP-C ?

## 2022-04-02 ENCOUNTER — Other Ambulatory Visit: Payer: Self-pay | Admitting: Family Medicine

## 2022-04-02 DIAGNOSIS — Z1231 Encounter for screening mammogram for malignant neoplasm of breast: Secondary | ICD-10-CM

## 2022-04-10 DIAGNOSIS — Z20822 Contact with and (suspected) exposure to covid-19: Secondary | ICD-10-CM | POA: Diagnosis not present

## 2022-04-16 DIAGNOSIS — Z20822 Contact with and (suspected) exposure to covid-19: Secondary | ICD-10-CM | POA: Diagnosis not present

## 2022-04-19 DIAGNOSIS — Z20822 Contact with and (suspected) exposure to covid-19: Secondary | ICD-10-CM | POA: Diagnosis not present

## 2022-04-20 DIAGNOSIS — Z23 Encounter for immunization: Secondary | ICD-10-CM | POA: Diagnosis not present

## 2022-04-22 DIAGNOSIS — Z20822 Contact with and (suspected) exposure to covid-19: Secondary | ICD-10-CM | POA: Diagnosis not present

## 2022-04-24 ENCOUNTER — Encounter (INDEPENDENT_AMBULATORY_CARE_PROVIDER_SITE_OTHER): Payer: Self-pay | Admitting: Adult Health

## 2022-04-24 ENCOUNTER — Ambulatory Visit (INDEPENDENT_AMBULATORY_CARE_PROVIDER_SITE_OTHER): Payer: Medicare Other | Admitting: Adult Health

## 2022-04-24 VITALS — BP 145/72 | HR 79 | Temp 98.0°F | Ht 65.0 in | Wt 233.0 lb

## 2022-04-24 DIAGNOSIS — R7301 Impaired fasting glucose: Secondary | ICD-10-CM

## 2022-04-24 DIAGNOSIS — Z6838 Body mass index (BMI) 38.0-38.9, adult: Secondary | ICD-10-CM

## 2022-04-24 DIAGNOSIS — K5900 Constipation, unspecified: Secondary | ICD-10-CM

## 2022-04-24 DIAGNOSIS — E669 Obesity, unspecified: Secondary | ICD-10-CM | POA: Diagnosis not present

## 2022-04-24 MED ORDER — SEMAGLUTIDE (2 MG/DOSE) 8 MG/3ML ~~LOC~~ SOPN: 2.0000 mg | PEN_INJECTOR | SUBCUTANEOUS | 0 refills | Status: DC

## 2022-04-29 NOTE — Progress Notes (Signed)
? ? ? ?Chief Complaint:  ? ?OBESITY ?Jackie Montoya is here to discuss her progress with her obesity treatment plan along with follow-up of her obesity related diagnoses. Yatzari is on keeping a food journal and adhering to recommended goals of 1300 calories and 90-100 grams of  protein and states she is following her eating plan approximately 50% of the time. Altair states she is working in her yard 30 minutes 7 times per week. ? ?Today's visit was #: 28 ?Starting weight: 287 lbs ?Starting date: 12/22/2019 ?Today's weight: 233 lbs ?Today's date: 04/24/2022 ?Total lbs lost to date: 82 ?Total lbs lost since last in-office visit: 0 ? ?Interim History:  ?Daielle has been on 1 mg dose of Ozempic since 11/21/2021.  ?She endorses increased sweet cravings the last few weeks.  ?Her current weight 233 lbs, wants to loss down to <200 lbs. ? ?Subjective:  ? ?1. Impaired fasting glucose ?In 2021, blood glucose results in Epic- slightly elevated between 100-105.  ?On 01/22/22 Blood glucose at 90, A1c at 5.3, insulin level 11.6.  ?She was started on Ozempic 0.'25mg'$  06/20/21. ?Ozempic increased from 0.'5mg'$  to '1mg'$  11/21/21. ?She denies mass in neck, dysphagia, dyspepsia, persistent hoarseness, abd pain, or N/V/Constipation. ?She endorses increased cravings the last few weeks. ? ?2. Constipation, unspecified constipation type ?Jordie has intermittently used over the [employer].  ?She denies abd pain, or family hx of colon cancer.  ?She estimates to have BM daily. ?She denies hematochezia. ? ?Assessment/Plan:  ? ?1. Impaired fasting glucose ?We will increase Ozempic 2 mg  once weekly. ? ?RF Ozempic '2mg'$  once week Disp 75m RF 0 ? ?2. Constipation, unspecified constipation type ?Use Miralax as directed and increase water intake and also fiber. ? ?3. Obesity: Current BMI 38.8 ?NCordellais currently in the action stage of change. As such, her goal is to continue with weight loss efforts. She has agreed to keeping a food journal and adhering to recommended goals  of 1300 calories and 90-100 grams of protein daily. ? ?Exercise goals: NNyiais to ride her stationary bike once a week.  ? ?Behavioral modification strategies: increasing lean protein intake, decreasing simple carbohydrates, meal planning and cooking strategies, keeping healthy foods in the home, and planning for success. ? ?NMarvalenehas agreed to follow-up with our clinic in 4 weeks. She was informed of the importance of frequent follow-up visits to maximize her success with intensive lifestyle modifications for her multiple health conditions.  ? ?Objective:  ? ?Blood pressure (!) 145/72, pulse 79, temperature 98 ?F (36.7 ?C), height '5\' 5"'$  (1.651 m), weight 233 lb (105.7 kg), last menstrual period 06/15/2002, SpO2 97 %. ?Body mass index is 38.77 kg/m?. ? ?General: Cooperative, alert, well developed, in no acute distress. ?HEENT: Conjunctivae and lids unremarkable. ?Cardiovascular: Regular rhythm.  ?Lungs: Normal work of breathing. ?Neurologic: No focal deficits.  ? ?Lab Results  ?Component Value Date  ? CREATININE 0.79 01/22/2022  ? BUN 14 01/22/2022  ? NA 139 01/22/2022  ? K 3.9 01/22/2022  ? CL 97 01/22/2022  ? CO2 24 01/22/2022  ? ?Lab Results  ?Component Value Date  ? ALT 13 01/22/2022  ? AST 20 01/22/2022  ? ALKPHOS 104 01/22/2022  ? BILITOT 0.8 01/22/2022  ? ?Lab Results  ?Component Value Date  ? HGBA1C 5.3 01/22/2022  ? HGBA1C 5.3 07/11/2021  ? HGBA1C 5.5 11/02/2020  ? HGBA1C 5.5 04/27/2020  ? HGBA1C 5.5 12/22/2019  ? ?Lab Results  ?Component Value Date  ? INSULIN 11.6 01/22/2022  ?  INSULIN 7.3 07/11/2021  ? INSULIN 10.2 11/02/2020  ? INSULIN 9.2 04/27/2020  ? INSULIN 11.7 12/22/2019  ? ?Lab Results  ?Component Value Date  ? TSH 1.870 12/22/2019  ? ?Lab Results  ?Component Value Date  ? CHOL 181 01/22/2022  ? HDL 73 01/22/2022  ? [place] 89 01/22/2022  ? TRIG 109 01/22/2022  ? ?Lab Results  ?Component Value Date  ? VD25OH 57.5 01/22/2022  ? VD25OH 64.5 07/11/2021  ? VD25OH 54.0 11/02/2020  ? ?Lab Results   ?Component Value Date  ? WBC 7.0 11/29/2008  ? HGB 15.0 11/29/2008  ? HCT 44.7 11/29/2008  ? MCV 89.4 11/29/2008  ? PLT 242 11/29/2008  ? ?No results found for: IRON, TIBC, FERRITIN ? ?Obesity Behavioral Intervention:  ? ?Approximately 15 minutes were spent on the discussion below. ? ?ASK: ?We discussed the diagnosis of obesity with [name] today and Corryn agreed to give [place] permission to discuss obesity behavioral modification therapy today. ? ?ASSESS: ?Berlyn has the diagnosis of obesity and her BMI today is 38.8. Doral is in the action stage of change.  ? ?ADVISE: ?Alyssabeth was educated on the multiple health risks of obesity as well as the benefit of weight loss to improve her health. She was advised of the need for long term treatment and the importance of lifestyle modifications to improve her current health and to decrease her risk of future health problems. ? ?AGREE: ?Multiple dietary modification options and treatment options were discussed and Emerald agreed to follow the recommendations documented in the above note. ? ?ARRANGE: ?Takako was educated on the importance of frequent visits to treat obesity as outlined per CMS and USPSTF guidelines and agreed to schedule her next follow up appointment today. ? ?Attestation Statements:  ? ?Reviewed by clinician on day of visit: allergies, medications, problem list, medical history, surgical history, family history, social history, and previous encounter notes. ? ?I, Brendell Tyus, RMA, am acting as transcriptionist for Mina Marble, NP. ? ?I have reviewed the above documentation for accuracy and completeness, and I agree with the above. -  Shaquana Buel d. Ashlinn Hemrick, NP-C ?

## 2022-04-30 DIAGNOSIS — K5900 Constipation, unspecified: Secondary | ICD-10-CM | POA: Insufficient documentation

## 2022-05-03 ENCOUNTER — Ambulatory Visit: Payer: Medicare Other

## 2022-05-06 ENCOUNTER — Ambulatory Visit
Admission: RE | Admit: 2022-05-06 | Discharge: 2022-05-06 | Disposition: A | Discharge status: Home / Self Care | Payer: Medicare Other | Source: Ambulatory Visit | Attending: Family Medicine | Admitting: Family Medicine

## 2022-05-06 DIAGNOSIS — Z1231 Encounter for screening mammogram for malignant neoplasm of breast: Secondary | ICD-10-CM | POA: Diagnosis not present

## 2022-05-08 ENCOUNTER — Other Ambulatory Visit: Payer: Self-pay | Admitting: Family Medicine

## 2022-05-08 DIAGNOSIS — R928 Other abnormal and inconclusive findings on diagnostic imaging of breast: Secondary | ICD-10-CM

## 2022-05-10 ENCOUNTER — Other Ambulatory Visit: Payer: Self-pay | Admitting: Family Medicine

## 2022-05-10 DIAGNOSIS — R928 Other abnormal and inconclusive findings on diagnostic imaging of breast: Secondary | ICD-10-CM

## 2022-05-20 ENCOUNTER — Ambulatory Visit
Admission: RE | Admit: 2022-05-20 | Discharge: 2022-05-20 | Disposition: A | Discharge status: Home / Self Care | Payer: Medicare Other | Source: Ambulatory Visit | Attending: Family Medicine | Admitting: Family Medicine

## 2022-05-20 DIAGNOSIS — R928 Other abnormal and inconclusive findings on diagnostic imaging of breast: Secondary | ICD-10-CM

## 2022-05-20 DIAGNOSIS — N6322 Unspecified lump in the left breast, upper inner quadrant: Secondary | ICD-10-CM | POA: Diagnosis not present

## 2022-05-21 ENCOUNTER — Other Ambulatory Visit: Payer: Self-pay | Admitting: Family Medicine

## 2022-05-21 DIAGNOSIS — R928 Other abnormal and inconclusive findings on diagnostic imaging of breast: Secondary | ICD-10-CM

## 2022-05-22 ENCOUNTER — Encounter (INDEPENDENT_AMBULATORY_CARE_PROVIDER_SITE_OTHER): Payer: Self-pay | Admitting: Adult Health

## 2022-05-22 ENCOUNTER — Ambulatory Visit (INDEPENDENT_AMBULATORY_CARE_PROVIDER_SITE_OTHER): Payer: Medicare Other | Admitting: Adult Health

## 2022-05-22 VITALS — BP 143/72 | HR 80 | Temp 98.2°F | Ht 65.0 in | Wt 233.0 lb

## 2022-05-22 DIAGNOSIS — Z6838 Body mass index (BMI) 38.0-38.9, adult: Secondary | ICD-10-CM | POA: Diagnosis not present

## 2022-05-22 DIAGNOSIS — E669 Obesity, unspecified: Secondary | ICD-10-CM | POA: Diagnosis not present

## 2022-05-22 DIAGNOSIS — N6320 Unspecified lump in the left breast, unspecified quadrant: Secondary | ICD-10-CM

## 2022-05-22 DIAGNOSIS — E559 Vitamin D deficiency, unspecified: Secondary | ICD-10-CM

## 2022-05-22 DIAGNOSIS — N6322 Unspecified lump in the left breast, upper inner quadrant: Secondary | ICD-10-CM

## 2022-05-22 DIAGNOSIS — Z7985 Long-term (current) use of injectable non-insulin antidiabetic drugs: Secondary | ICD-10-CM | POA: Diagnosis not present

## 2022-05-22 DIAGNOSIS — R7301 Impaired fasting glucose: Secondary | ICD-10-CM | POA: Diagnosis not present

## 2022-05-22 MED ORDER — SEMAGLUTIDE (2 MG/DOSE) 8 MG/3ML ~~LOC~~ SOPN: 2.0000 mg | PEN_INJECTOR | SUBCUTANEOUS | 0 refills | Status: DC

## 2022-05-27 NOTE — Progress Notes (Signed)
Chief Complaint:   OBESITY Jackie Montoya is here to discuss her progress with her obesity treatment plan along with follow-up of her obesity related diagnoses. Nusaiba is on keeping a food journal and adhering to recommended goals of 1300 calories and 90-100 grams of protein daily and states she is following her eating plan approximately 60% of the time. Ericha states she is bike riding for 15 minutes 1 time per week.  Today's visit was #: 87 Starting weight: 287 lbs Starting date: 12/22/2019 Today's weight: 233 lbs Today's date: 05/22/2022 Total lbs lost to date: 85 Total lbs lost since last in-office visit: 0  Interim History:  04/24/22-Ozempic was increased from 1 mg to 2 mg due to increased cravings (ice cream).   She has had 4 doses of Ozempic 2 mg, tolerating dose increase well.    Left breast mammogram, ultrasound, biopsy on 05/28/2022.    Subjective:   1. Mass of left breast, unspecified quadrant 01/2021 history of left breast biopsy.    05/06/2022 MM 3D Screen Breast Bil: IMPRESSION: Further evaluation is suggested for possible asymmetry in the left breast.   RECOMMENDATION: Diagnostic mammogram and possibly ultrasound of the left breast. (Code:[id number]-[id number])  05/20/2022 MM DIAG BREAST TOMO UNI L: IMPRESSION: 1. Suspicious 0.6 cm UPPER INNER LEFT breast mass. Tissue sampling is recommended. 2. No abnormal appearing LEFT axillary lymph nodes.   RECOMMENDATION: Ultrasound-guided LEFT breast biopsy, which will be scheduled.   Only family history of breast cancer, first cousin.  2. IFG (impaired fasting glucose) 04/24/22-Ozempic was increased from 1 mg to 2 mg due to increased cravings (ice cream).   She has had 4 doses of Ozempic 2 mg, tolerating dose increase well.   She denies mass in neck, dysphagia, dyspepsia, persistent hoarseness, abd pain, or N/V/Constipation. Request is a 43-monthsupply of Ozempic.  3. Vitamin D deficiency She is on OTC vitamin D3 5000 units  daily.  Assessment/Plan:   1. Mass of left breast, unspecified quadrant Left breast biopsy on 05/28/2022.  2. IFG (impaired fasting glucose) We will refill Ozempic 2 mg once weekly for 3 months.   We will recheck labs at her next office visit.  - Semaglutide, 2 MG/DOSE, 8 MG/3ML SOPN; Inject 2 mg as directed once a week.  Dispense: 9 mL; Refill: 0  3. Vitamin D deficiency Continue OTC supplement, we will recheck labs at her next office visit.  4. Obesity: Current BMI 38.8 NJacyndais currently in the action stage of change. As such, her goal is to continue with weight loss efforts. She has agreed to keeping a food journal and adhering to recommended goals of 1300 calories and 90-100 grams of protein daily.   We will recheck fasting labs at her next office visit.  Exercise goals: As is.  Behavioral modification strategies: increasing lean protein intake, decreasing simple carbohydrates, meal planning and cooking strategies, keeping healthy foods in the home, and planning for success.  NNatasiahas agreed to follow-up with our clinic in 4 to 5 weeks. She was informed of the importance of frequent follow-up visits to maximize her success with intensive lifestyle modifications for her multiple health conditions.   Objective:   Blood pressure (!) 143/72, pulse 80, temperature 98.2 F (36.8 C), height '5\' 5"'$  (1.651 m), weight 233 lb (105.7 kg), last menstrual period 06/15/2002, SpO2 98 %. Body mass index is 38.77 kg/m.  General: Cooperative, alert, well developed, in no acute distress. HEENT: Conjunctivae and lids unremarkable. Cardiovascular: Regular rhythm.  Lungs:  Normal work of breathing. Neurologic: No focal deficits.   Lab Results  Component Value Date   CREATININE 0.79 01/22/2022   BUN 14 01/22/2022   NA 139 01/22/2022   K 3.9 01/22/2022   CL 97 01/22/2022   CO2 24 01/22/2022   Lab Results  Component Value Date   ALT 13 01/22/2022   AST 20 01/22/2022   ALKPHOS 104  01/22/2022   BILITOT 0.8 01/22/2022   Lab Results  Component Value Date   HGBA1C 5.3 01/22/2022   HGBA1C 5.3 07/11/2021   HGBA1C 5.5 11/02/2020   HGBA1C 5.5 04/27/2020   HGBA1C 5.5 12/22/2019   Lab Results  Component Value Date   INSULIN 11.6 01/22/2022   INSULIN 7.3 07/11/2021   INSULIN 10.2 11/02/2020   INSULIN 9.2 04/27/2020   INSULIN 11.7 12/22/2019   Lab Results  Component Value Date   TSH 1.870 12/22/2019   Lab Results  Component Value Date   CHOL 181 01/22/2022   HDL 73 01/22/2022   LDLCALC 89 01/22/2022   TRIG 109 01/22/2022   Lab Results  Component Value Date   VD25OH 57.5 01/22/2022   VD25OH 64.5 07/11/2021   VD25OH 54.0 11/02/2020   Lab Results  Component Value Date   WBC 7.0 11/29/2008   HGB 15.0 11/29/2008   HCT 44.7 11/29/2008   MCV 89.4 11/29/2008   PLT 242 11/29/2008   No results found for: "IRON", "TIBC", "FERRITIN"  Obesity Behavioral Intervention:   Approximately 15 minutes were spent on the discussion below.  ASK: We discussed the diagnosis of obesity with [name] today and Aniyla agreed to give [place] permission to discuss obesity behavioral modification therapy today.  ASSESS: Madalin has the diagnosis of obesity and her BMI today is 38.8. Michaele is in the action stage of change.   ADVISE: Ottis was educated on the multiple health risks of obesity as well as the benefit of weight loss to improve her health. She was advised of the need for long term treatment and the importance of lifestyle modifications to improve her current health and to decrease her risk of future health problems.  AGREE: Multiple dietary modification options and treatment options were discussed and Azriella agreed to follow the recommendations documented in the above note.  ARRANGE: Reannah was educated on the importance of frequent visits to treat obesity as outlined per CMS and USPSTF guidelines and agreed to schedule her next follow up appointment today.  Attestation  Statements:   Reviewed by clinician on day of visit: allergies, medications, problem list, medical history, surgical history, family history, social history, and previous encounter notes.   [name], am acting as transcriptionist for Mina Marble, NP.  I have reviewed the above documentation for accuracy and completeness, and I agree with the above. Valetta Fuller D. Milianna Ericsson, NP-C

## 2022-05-28 ENCOUNTER — Ambulatory Visit
Admission: RE | Admit: 2022-05-28 | Discharge: 2022-05-28 | Disposition: A | Discharge status: Home / Self Care | Payer: Medicare Other | Source: Ambulatory Visit | Attending: Family Medicine | Admitting: Family Medicine

## 2022-05-28 DIAGNOSIS — N6322 Unspecified lump in the left breast, upper inner quadrant: Secondary | ICD-10-CM | POA: Diagnosis not present

## 2022-05-28 DIAGNOSIS — R920 Mammographic microcalcification found on diagnostic imaging of breast: Secondary | ICD-10-CM | POA: Diagnosis not present

## 2022-05-28 DIAGNOSIS — C50212 Malignant neoplasm of upper-inner quadrant of left female breast: Secondary | ICD-10-CM | POA: Diagnosis not present

## 2022-05-28 DIAGNOSIS — Z170 Estrogen receptor positive status [ER+]: Secondary | ICD-10-CM | POA: Diagnosis not present

## 2022-05-28 DIAGNOSIS — R928 Other abnormal and inconclusive findings on diagnostic imaging of breast: Secondary | ICD-10-CM

## 2022-05-28 DIAGNOSIS — N6320 Unspecified lump in the left breast, unspecified quadrant: Secondary | ICD-10-CM | POA: Insufficient documentation

## 2022-05-28 HISTORY — PX: BREAST BIOPSY: SHX20

## 2022-05-30 ENCOUNTER — Telehealth: Payer: Self-pay | Admitting: Hematology and Oncology

## 2022-05-30 NOTE — Telephone Encounter (Signed)
Spoke to patient to confirm afternoon clinic appointment for 6/21, sending paperwork via e-mail

## 2022-06-03 ENCOUNTER — Encounter: Payer: Self-pay | Admitting: *Deleted

## 2022-06-03 DIAGNOSIS — Z170 Estrogen receptor positive status [ER+]: Secondary | ICD-10-CM | POA: Insufficient documentation

## 2022-06-04 ENCOUNTER — Ambulatory Visit: Payer: Self-pay | Admitting: Surgery

## 2022-06-04 DIAGNOSIS — C50912 Malignant neoplasm of unspecified site of left female breast: Secondary | ICD-10-CM

## 2022-06-04 NOTE — Progress Notes (Signed)
Radiation Oncology         [phone] ________________________________  Initial Outpatient Consultation  Name: Jackie Montoya MRN: [id number]  Date: 06/05/2022  DOB: [birth year]  [id number], Margaretha Sheffield, MD  Donnie Mesa, MD   REFERRING PHYSICIAN: Donnie Mesa, MD  DIAGNOSIS: No diagnosis found.  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  CHIEF COMPLAINT: Here to discuss management of left breast cancer  HISTORY OF PRESENT ILLNESS::Jackie Montoya is a 71 y.o. female who presented with a left breast abnormality on the following imaging: bilateral screening mammogram on the date of 05/06/22.  No symptoms, if any, were reported at that time.   Diagnostic left breast mammogram and left breast ultrasound on 05/20/22 further revealed a suspicious 0.6 cm upper inner left breast mass (11 o'clock position, 5 cmfn).  No abnormal left axillary lymph nodes were appreciated.  Biopsy of the 11 o'clock left breast on the date of 05/28/22 showed grade 1 well differentiated ductal adenocarcinoma measuring 6 mm in the greatest linear extent, with intermediate grade ductal carcinoma in-situ.  ER status: 100% positive; PR status 95% positive (both with strong staining intensity); Proliferation marker Ki67 at 10%; Her2 status negative; Grade 1. No lymph nodes were examined.   ***  PREVIOUS RADIATION THERAPY: No  PAST MEDICAL HISTORY:  has a past medical history of Abnormal Pap smear (10/2013), Anxiety, Arthritis, Back pain, Depression, Dupuytren contracture, Edema, lower extremity, Foot pain, GERD (gastroesophageal reflux disease), Hyperlipidemia, Hypertension, Joint pain, Leg pain, OA (osteoarthritis), Obesity, Other specified congenital anomalies, Shortness of breath, Sleep apnea, and Vitamin D deficiency.    PAST SURGICAL HISTORY: Past Surgical History:   Procedure Laterality Date   BREAST BIOPSY Left 2020   benign   BREAST BIOPSY Left 05/28/2022   CATARACT EXTRACTION     NASAL SINUS SURGERY     PELVIC LAPAROSCOPY  1996   fibroids   TONSILLECTOMY AND ADENOIDECTOMY  age 61   TUBAL LIGATION Bilateral 1977    FAMILY HISTORY: family history includes Alcohol abuse in her father; Alzheimer's disease in her mother; Anxiety disorder in her mother; Colon cancer in her paternal grandmother; Colon cancer (age of onset: 60) in her sister; Drug abuse in her sister; High Cholesterol in her mother; Hypertension in her mother; Lung cancer (age of onset: 90) in her sister.  SOCIAL HISTORY:  reports that she has quit smoking. She has never used smokeless tobacco. She reports current alcohol use of about 4.0 - 5.0 standard drinks of alcohol per week. She reports that she does not use drugs.  ALLERGIES: Codeine  MEDICATIONS:  Current Outpatient Medications  Medication Sig Dispense Refill   amLODipine (NORVASC) 5 MG tablet Take 5 mg by mouth daily.     atorvastatin (LIPITOR) 20 MG tablet Take 20 mg by mouth daily.     buPROPion (WELLBUTRIN XL) 300 MG 24 hr tablet Take 300 mg by mouth daily.     CALCIUM PO Take by mouth daily.      Cholecalciferol (VITAMIN D) 125 MCG (5000 UT) CAPS Take 1 capsule by mouth daily. 30 capsule 0   FLUoxetine (PROZAC) 40 MG capsule Take 40 mg by mouth daily.     fluticasone (FLONASE) 50 MCG/ACT nasal spray Place into both nostrils daily.     hydrochlorothiazide (HYDRODIURIL) 25 MG tablet Take 25 mg by mouth daily.  loratadine (CLARITIN) 10 MG tablet Take 10 mg by mouth daily.     losartan (COZAAR) 25 MG tablet Take 25 mg by mouth daily.     Multiple Vitamin (MULTIVITAMIN) tablet Take 1 tablet by mouth daily.     omeprazole (PRILOSEC) 20 MG capsule Take 20 mg by mouth daily.     OVER THE COUNTER MEDICATION [place] Apple cider prn     Semaglutide, 2 MG/DOSE, 8 MG/3ML SOPN Inject 2 mg as directed once a week. 9 mL 0   No  current facility-administered medications for this encounter.    REVIEW OF SYSTEMS: As above in HPI.   PHYSICAL EXAM:  vitals were not taken for this visit.   General: Alert and oriented, in no acute distress HEENT: Head is normocephalic. Extraocular movements are intact. Oropharynx is clear. Neck: Neck is supple, no palpable cervical or supraclavicular lymphadenopathy. Heart: Regular in rate and rhythm with no murmurs, rubs, or gallops. Chest: Clear to auscultation bilaterally, with no rhonchi, wheezes, or rales. Abdomen: Soft, nontender, nondistended, with no rigidity or guarding. Extremities: No cyanosis or edema. Lymphatics: see Neck Exam Skin: No concerning lesions. Musculoskeletal: symmetric strength and muscle tone throughout. Neurologic: Cranial nerves II through XII are grossly intact. No obvious focalities. Speech is fluent. Coordination is intact. Psychiatric: Judgment and insight are intact. Affect is appropriate. Breasts: *** . No other palpable masses appreciated in the breasts or axillae *** .    ECOG = ***  0 - Asymptomatic (Fully active, able to carry on all predisease activities without restriction)  1 - Symptomatic but completely ambulatory (Restricted in physically strenuous activity but ambulatory and able to carry out work of a light or sedentary nature. For example, light housework, office work)  2 - Symptomatic, <50% in bed during the day (Ambulatory and capable of all self care but unable to carry out any work activities. Up and about more than 50% of waking hours)  3 - Symptomatic, >50% in bed, but not bedbound (Capable of only limited self-care, confined to bed or chair 50% or more of waking hours)  4 - Bedbound (Completely disabled. Cannot carry on any self-care. Totally confined to bed or chair)  5 - Death   Eustace Pen MM, Creech RH, Tormey DC, et al. [phone]). "Toxicity and response criteria of the [facility]". [place] Oncol. 5  (6): 649-55   LABORATORY DATA:  Lab Results  Component Value Date   WBC 7.0 11/29/2008   HGB 15.0 11/29/2008   HCT 44.7 11/29/2008   MCV 89.4 11/29/2008   PLT 242 11/29/2008   CMP     Component Value Date/Time   NA 139 01/22/2022 1054   K 3.9 01/22/2022 1054   CL 97 01/22/2022 1054   CO2 24 01/22/2022 1054   GLUCOSE 90 01/22/2022 1054   GLUCOSE 110 (H) 11/29/2008 1209   BUN 14 01/22/2022 1054   CREATININE 0.79 01/22/2022 1054   CALCIUM 9.7 01/22/2022 1054   PROT 6.7 01/22/2022 1054   ALBUMIN 4.8 01/22/2022 1054   AST 20 01/22/2022 1054   ALT 13 01/22/2022 1054   ALKPHOS 104 01/22/2022 1054   BILITOT 0.8 01/22/2022 1054   GFRNONAA 84 11/02/2020 1112   GFRAA 97 11/02/2020 1112         RADIOGRAPHY: [place] LT BREAST BX W LOC DEV 1ST LESION IMG BX SPEC US GUIDE  Addendum Date: 05/31/2022   ADDENDUM REPORT: 05/31/2022 19:15 ADDENDUM: Pathology revealed GRADE I INVASIVE WELL DIFFERENTIATED DUCTAL ADENOCARCINOMA,  DUCTAL CARCINOMA IN SITU, INTERMEDIATE NUCLEAR GRADE, SOLID AND CRIBRIFORM TYPES WITHOUT NECROSIS, MICROCALCIFICATIONS PRESENT WITHIN INVASIVE TUMOR of the LEFT breast, 11:00 o'clock, (coil clip). This was found to be concordant by Dr. Lovey Newcomer. Pathology results were discussed with the patient by telephone. The patient reported doing well after the biopsy with tenderness at the site. Post biopsy instructions and care were reviewed and questions were answered. The patient was encouraged to call [facility] for any additional concerns. My direct phone number was provided. The patient was referred to [facility] on June 05, 2022. Pathology results reported by Terie Purser, RN on 05/30/2022. Electronically Signed   By: Lovey Newcomer M.D.   On: 05/31/2022 19:15   Result Date: 05/31/2022 CLINICAL DATA:  Patient with indeterminate left breast mass. EXAM: ULTRASOUND GUIDED LEFT BREAST  CORE NEEDLE BIOPSY COMPARISON:  None Available. PROCEDURE: I met with the patient and we discussed the procedure of ultrasound-guided biopsy, including benefits and alternatives. We discussed the high likelihood of a successful procedure. We discussed the risks of the procedure, including infection, bleeding, tissue injury, clip migration, and inadequate sampling. Informed written consent was given. The usual time-out protocol was performed immediately prior to the procedure. Lesion quadrant: Upper inner quadrant Using sterile technique and 1% Lidocaine as local anesthetic, under direct ultrasound visualization, a 14 gauge spring-loaded device was used to perform biopsy of left breast mass 11 o'clock position using a medial approach. At the conclusion of the procedure coil shaped tissue marker clip was deployed into the biopsy cavity. Follow up 2 view mammogram was performed and dictated separately. IMPRESSION: Ultrasound guided biopsy of left breast mass 11 o'clock position. No apparent complications. Electronically Signed: By: Lovey Newcomer M.D. On: 05/28/2022 12:08  MM CLIP PLACEMENT LEFT  Result Date: 05/28/2022 CLINICAL DATA:  Patient with indeterminate left breast mass. EXAM: 3D DIAGNOSTIC LEFT MAMMOGRAM POST ULTRASOUND BIOPSY COMPARISON:  Previous exam(s). FINDINGS: 3D Mammographic images were obtained following ultrasound guided biopsy of left breast mass. The biopsy marking clip is in expected position at the site of biopsy. IMPRESSION: Appropriate positioning of the coil shaped biopsy marking clip at the site of biopsy in the upper inner left breast. Final Assessment: Post Procedure Mammograms for Marker Placement Electronically Signed   By: Lovey Newcomer M.D.   On: 05/28/2022 12:09  MM DIAG BREAST TOMO UNI LEFT  Result Date: 05/20/2022 CLINICAL DATA:  71 year old female for further evaluation of 2 possible LEFT breast asymmetries identified on screening mammogram. EXAM: DIGITAL DIAGNOSTIC UNILATERAL  LEFT MAMMOGRAM WITH TOMOSYNTHESIS AND CAD; ULTRASOUND LEFT BREAST LIMITED TECHNIQUE: Left digital diagnostic mammography and breast tomosynthesis was performed. The images were evaluated with computer-aided detection.; Targeted ultrasound examination of the left breast was performed. COMPARISON:  Previous exam(s). ACR Breast Density Category b: There are scattered areas of fibroglandular density. FINDINGS: Full field and spot compression views of the LEFT breast demonstrate a persistent 0.6 cm irregular mass within the UPPER LEFT breast, middle depth. No other persistent suspicious abnormalities are noted. Targeted ultrasound is performed, showing a 0.4 x 0.4 x 0.6 cm irregular hypoechoic mass at the 11 o'clock position of the LEFT breast 5 cm from the nipple, a good correlate to the mammographic finding. No abnormal LEFT axillary lymph nodes are noted. IMPRESSION: 1. Suspicious 0.6 cm UPPER INNER LEFT breast mass. Tissue sampling is recommended. 2. No abnormal appearing LEFT axillary lymph nodes. RECOMMENDATION: Ultrasound-guided LEFT breast  biopsy, which will be scheduled. I have discussed the findings and recommendations with the patient. If applicable, a reminder letter will be sent to the patient regarding the next appointment. BI-RADS CATEGORY  4: Suspicious. Electronically Signed   By: Margarette Canada M.D.   On: 05/20/2022 12:30  US BREAST LTD UNI LEFT INC AXILLA  Result Date: 05/20/2022 CLINICAL DATA:  71 year old female for further evaluation of 2 possible LEFT breast asymmetries identified on screening mammogram. EXAM: DIGITAL DIAGNOSTIC UNILATERAL LEFT MAMMOGRAM WITH TOMOSYNTHESIS AND CAD; ULTRASOUND LEFT BREAST LIMITED TECHNIQUE: Left digital diagnostic mammography and breast tomosynthesis was performed. The images were evaluated with computer-aided detection.; Targeted ultrasound examination of the left breast was performed. COMPARISON:  Previous exam(s). ACR Breast Density Category b: There are  scattered areas of fibroglandular density. FINDINGS: Full field and spot compression views of the LEFT breast demonstrate a persistent 0.6 cm irregular mass within the UPPER LEFT breast, middle depth. No other persistent suspicious abnormalities are noted. Targeted ultrasound is performed, showing a 0.4 x 0.4 x 0.6 cm irregular hypoechoic mass at the 11 o'clock position of the LEFT breast 5 cm from the nipple, a good correlate to the mammographic finding. No abnormal LEFT axillary lymph nodes are noted. IMPRESSION: 1. Suspicious 0.6 cm UPPER INNER LEFT breast mass. Tissue sampling is recommended. 2. No abnormal appearing LEFT axillary lymph nodes. RECOMMENDATION: Ultrasound-guided LEFT breast biopsy, which will be scheduled. I have discussed the findings and recommendations with the patient. If applicable, a reminder letter will be sent to the patient regarding the next appointment. BI-RADS CATEGORY  4: Suspicious. Electronically Signed   By: Margarette Canada M.D.   On: 05/20/2022 12:30  MM 3D SCREEN BREAST BILATERAL  Result Date: 05/07/2022 CLINICAL DATA:  Screening. EXAM: DIGITAL SCREENING BILATERAL MAMMOGRAM WITH TOMOSYNTHESIS AND CAD TECHNIQUE: Bilateral screening digital craniocaudal and mediolateral oblique mammograms were obtained. Bilateral screening digital breast tomosynthesis was performed. The images were evaluated with computer-aided detection. COMPARISON:  Previous exam(s). ACR Breast Density Category c: The breast tissue is heterogeneously dense, which may obscure small masses. FINDINGS: In the left breast, a possible asymmetry warrants further evaluation. In the right breast, no findings suspicious for malignancy. IMPRESSION: Further evaluation is suggested for possible asymmetry in the left breast. RECOMMENDATION: Diagnostic mammogram and possibly ultrasound of the left breast. (Code:FI-L-1M) The patient will be contacted regarding the findings, and additional imaging will be scheduled. BI-RADS  CATEGORY  0: Incomplete. Need additional imaging evaluation and/or prior mammograms for comparison. Electronically Signed   By: Lovey Newcomer M.D.   On: 05/07/2022 14:36      IMPRESSION/PLAN: ***   It was a pleasure meeting the patient today. We discussed the risks, benefits, and side effects of radiotherapy. I recommend radiotherapy to the *** to reduce her risk of locoregional recurrence by 2/3.  We discussed that radiation would take approximately *** weeks to complete and that I would give the patient a few weeks to heal following surgery before starting treatment planning. *** If chemotherapy were to be given, this would precede radiotherapy. We spoke about acute effects including skin irritation and fatigue as well as much less common late effects including internal organ injury or irritation. We spoke about the latest technology that is used to minimize the risk of late effects for patients undergoing radiotherapy to the breast or chest wall. No guarantees of treatment were given. The patient is enthusiastic about proceeding with treatment. I look forward to participating in the patient's care.  I will  await her referral back to me for postoperative follow-up and eventual CT simulation/treatment planning.  On date of service, in total, I spent *** minutes on this encounter. Patient was seen in person.   __________________________________________   Eppie Gibson, MD  This document serves as a record of services personally performed by Eppie Gibson, MD. It was created on her behalf by Roney Mans, a trained medical scribe. The creation of this record is based on the scribe's personal observations and the provider's statements to them. This document has been checked and approved by the attending provider.

## 2022-06-04 NOTE — Progress Notes (Signed)
[facility] NOTE  Patient Care Team: Kelton Pillar, MD as PCP - General (Family Medicine) Mauro Kaufmann, RN as Oncology Nurse Navigator Rockwell Germany, RN as Oncology Nurse Navigator Donnie Mesa, MD as Consulting Physician (General Surgery) Nicholas Lose, MD as Consulting Physician (Hematology and Oncology) Eppie Gibson, MD as Attending Physician (Radiation Oncology)  CHIEF COMPLAINTS/PURPOSE OF CONSULTATION:  Newly diagnosed breast cancer  HISTORY OF PRESENTING ILLNESS:  Jackie Montoya 71 y.o. female is here because of recent diagnosis of left breast cancer. Screening mammogram on 05/06/22 detected possible asymmetry in the left breast.  It measured 0.6 cm 11 o'clock position.  Ultrasound-guided biopsy revealed grade 1 IDC with DCIS that was ER 100%, PR 95%, Ki-67 10%, HER2 negative.  She was presented this morning to the multidisciplinary tumor board and she is here today to discuss treatment plan.   I reviewed her records extensively and collaborated the history with the patient.  SUMMARY OF ONCOLOGIC HISTORY: Oncology History  Malignant neoplasm of upper-inner quadrant of left breast in female, estrogen receptor positive ([place])  06/03/2022 Initial Diagnosis   Malignant neoplasm of upper-inner quadrant of left breast in female, estrogen receptor positive ([facility])   06/05/2022 Cancer Staging   Staging form: Breast, AJCC 8th Edition - Clinical: Stage IA (cT1b, cN0, cM0, G1, ER+, PR+, HER2-) - Signed by Nicholas Lose, MD on 06/05/2022 Stage prefix: Initial diagnosis Histologic grading system: 3 grade system      MEDICAL HISTORY:  Past Medical History:  Diagnosis Date   Abnormal Pap smear 10/2013   Asc-us +HRHPV   Anxiety    Arthritis    Back pain    Breast cancer (HCC)    Depression    Dupuytren contracture    Edema, lower extremity    Foot pain    GERD (gastroesophageal reflux disease)    Hyperlipidemia    Hypertension    Joint pain    Leg  pain    OA (osteoarthritis)    Obesity    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   Shortness of breath    Sleep apnea    Vitamin D deficiency     SURGICAL HISTORY: Past Surgical History:  Procedure Laterality Date   BREAST BIOPSY Left 2020   benign   BREAST BIOPSY Left 05/28/2022   CATARACT EXTRACTION     NASAL SINUS SURGERY     PELVIC LAPAROSCOPY  1996   fibroids   TONSILLECTOMY AND ADENOIDECTOMY  age 6   TUBAL LIGATION Bilateral 1977    SOCIAL HISTORY: Social History   Socioeconomic History   Marital status: Married    Spouse name: steve   Number of children: 2   Years of education: Not on file   Highest education level: Not on file  Occupational History   Occupation: retired    Fish farm manager: [employer]  Tobacco Use   Smoking status: Former    Years: 15.00    Types: Cigarettes    Passive exposure: Never   Smokeless tobacco: Never  Substance and Sexual Activity   Alcohol use: Yes    Alcohol/week: 4.0 - 5.0 standard drinks of alcohol    Types: 4 - 5 Glasses of wine per week   Drug use: No   Sexual activity: Yes    Partners: Male    Birth control/protection: Post-menopausal  Other Topics Concern   Not on file  Social History Narrative   Not on file  Social Determinants of Health   Financial Resource Strain: Not on file  Food Insecurity: Not on file  Transportation Needs: Not on file  Physical Activity: Not on file  Stress: Not on file  Social Connections: Not on file  Intimate Partner Violence: Not on file    FAMILY HISTORY: Family History  Problem Relation Age of Onset   Alzheimer's disease Mother    Hypertension Mother    High Cholesterol Mother    Anxiety disorder Mother    Drug abuse Sister        overdose   Alcohol abuse Father    Lung cancer Sister 88   Colon cancer Sister 56       colectomy   Colon cancer Paternal Grandmother        ?    ALLERGIES:  is allergic to codeine.  MEDICATIONS:  Current  Outpatient Medications  Medication Sig Dispense Refill   amLODipine (NORVASC) 5 MG tablet Take 5 mg by mouth daily.     atorvastatin (LIPITOR) 20 MG tablet Take 20 mg by mouth daily.     buPROPion (WELLBUTRIN XL) 300 MG 24 hr tablet Take 300 mg by mouth daily.     CALCIUM PO Take by mouth daily.      Cholecalciferol (VITAMIN D) 125 MCG (5000 UT) CAPS Take 1 capsule by mouth daily. 30 capsule 0   FLUoxetine (PROZAC) 40 MG capsule Take 40 mg by mouth daily.     fluticasone (FLONASE) 50 MCG/ACT nasal spray Place into both nostrils daily.     hydrochlorothiazide (HYDRODIURIL) 25 MG tablet Take 25 mg by mouth daily.     loratadine (CLARITIN) 10 MG tablet Take 10 mg by mouth daily.     losartan (COZAAR) 25 MG tablet Take 25 mg by mouth daily.     omeprazole (PRILOSEC) 20 MG capsule Take 20 mg by mouth daily.     OVER THE COUNTER MEDICATION [place] Apple cider prn     Semaglutide, 2 MG/DOSE, 8 MG/3ML SOPN Inject 2 mg as directed once a week. 9 mL 0   Turmeric (QC TUMERIC COMPLEX) 500 MG CAPS Take 1 capsule by mouth daily.     No current facility-administered medications for this visit.    REVIEW OF SYSTEMS:   Constitutional: Denies fevers, chills or abnormal night sweats   All other systems were reviewed with the patient and are negative.  PHYSICAL EXAMINATION: ECOG PERFORMANCE STATUS: 1 - Symptomatic but completely ambulatory  Vitals:   06/05/22 1233  BP: (!) 164/75  Pulse: 92  Resp: 18  Temp: (!) 97.5 F (36.4 C)  SpO2: 98%   Filed Weights   06/05/22 1233  Weight: 238 lb 8 oz (108.2 kg)       LABORATORY DATA:  I have reviewed the data as listed Lab Results  Component Value Date   WBC 7.5 06/05/2022   HGB 14.1 06/05/2022   HCT 41.4 06/05/2022   MCV 88.5 06/05/2022   PLT 248 06/05/2022   Lab Results  Component Value Date   NA 137 06/05/2022   K 3.9 06/05/2022   CL 102 06/05/2022   CO2 29 06/05/2022    RADIOGRAPHIC STUDIES: I have personally reviewed the  radiological reports and agreed with the findings in the report.  ASSESSMENT AND PLAN:  Malignant neoplasm of upper-inner quadrant of left breast in female, estrogen receptor positive ([place] [place]) 05/28/2022 screening mammogram detected left breast asymmetry at 11 o'clock position measuring 0.6 cm.  Ultrasound-guided biopsy revealed grade 1  IDC with DCIS intermediate grade, ER 100%, PR 95%, Ki-67 10%, HER2 negative, axilla negative  Pathology and radiology counseling: Discussed with the patient, the details of pathology including the type of breast cancer,the clinical staging, the significance of ER, PR and HER-2/neu receptors and the implications for treatment. After reviewing the pathology in detail, we proceeded to discuss the different treatment options between surgery, radiation, chemotherapy, antiestrogen therapies.  Treatment plan: 1.  Left lumpectomy 2. +/- XRT 3.  Adjuvant antiestrogen therapy Patient is on board with the treatment plan. Return to clinic after surgery to discuss final pathology report.   All questions were answered. The patient knows to call the clinic with any problems, questions or concerns.    Harriette Ohara, MD 06/05/22  I Gardiner Coins am scribing for Dr. Lindi Adie  I have reviewed the above documentation for accuracy and completeness, and I agree with the above.

## 2022-06-05 ENCOUNTER — Inpatient Hospital Stay (HOSPITAL_BASED_OUTPATIENT_CLINIC_OR_DEPARTMENT_OTHER): Payer: Medicare Other | Admitting: Genetic Counselor

## 2022-06-05 ENCOUNTER — Encounter: Payer: Self-pay | Admitting: Hematology and Oncology

## 2022-06-05 ENCOUNTER — Inpatient Hospital Stay: Payer: Medicare Other | Attending: Hematology and Oncology | Admitting: Hematology and Oncology

## 2022-06-05 ENCOUNTER — Encounter: Payer: Self-pay | Admitting: Radiation Oncology

## 2022-06-05 ENCOUNTER — Ambulatory Visit: Payer: Self-pay | Admitting: Surgery

## 2022-06-05 ENCOUNTER — Inpatient Hospital Stay: Payer: Medicare Other

## 2022-06-05 ENCOUNTER — Other Ambulatory Visit: Payer: Self-pay

## 2022-06-05 ENCOUNTER — Ambulatory Visit
Admission: RE | Admit: 2022-06-05 | Discharge: 2022-06-05 | Disposition: A | Discharge status: Home / Self Care | Payer: Medicare Other | Source: Ambulatory Visit | Attending: Radiation Oncology | Admitting: Radiation Oncology

## 2022-06-05 ENCOUNTER — Ambulatory Visit: Payer: Medicare Other | Admitting: Radiation Oncology

## 2022-06-05 ENCOUNTER — Encounter: Payer: Self-pay | Admitting: Genetic Counselor

## 2022-06-05 ENCOUNTER — Ambulatory Visit: Payer: Medicare Other | Admitting: Physical Therapy

## 2022-06-05 DIAGNOSIS — C50212 Malignant neoplasm of upper-inner quadrant of left female breast: Secondary | ICD-10-CM

## 2022-06-05 DIAGNOSIS — Z79899 Other long term (current) drug therapy: Secondary | ICD-10-CM | POA: Diagnosis not present

## 2022-06-05 DIAGNOSIS — Z87891 Personal history of nicotine dependence: Secondary | ICD-10-CM | POA: Insufficient documentation

## 2022-06-05 DIAGNOSIS — Z170 Estrogen receptor positive status [ER+]: Secondary | ICD-10-CM | POA: Insufficient documentation

## 2022-06-05 DIAGNOSIS — Z803 Family history of malignant neoplasm of breast: Secondary | ICD-10-CM | POA: Diagnosis not present

## 2022-06-05 DIAGNOSIS — Z800 Family history of malignant neoplasm of digestive organs: Secondary | ICD-10-CM | POA: Diagnosis not present

## 2022-06-05 DIAGNOSIS — C50912 Malignant neoplasm of unspecified site of left female breast: Secondary | ICD-10-CM | POA: Diagnosis not present

## 2022-06-05 HISTORY — DX: Family history of malignant neoplasm of digestive organs: Z80.0

## 2022-06-05 HISTORY — DX: Family history of malignant neoplasm of breast: Z80.3

## 2022-06-05 LAB — CBC WITH DIFFERENTIAL (CANCER CENTER ONLY)
Abs Immature Granulocytes: 0.01 10*3/uL (ref 0.00–0.07)
Basophils Absolute: 0 10*3/uL (ref 0.0–0.1)
Basophils Relative: 0 %
Eosinophils Absolute: 0.1 10*3/uL (ref 0.0–0.5)
Eosinophils Relative: 1 %
HCT: 41.4 % (ref 36.0–46.0)
Hemoglobin: 14.1 g/dL (ref 12.0–15.0)
Immature Granulocytes: 0 %
Lymphocytes Relative: 29 %
Lymphs Abs: 2.2 10*3/uL (ref 0.7–4.0)
MCH: 30.1 pg (ref 26.0–34.0)
MCHC: 34.1 g/dL (ref 30.0–36.0)
MCV: 88.5 fL (ref 80.0–100.0)
Monocytes Absolute: 0.5 10*3/uL (ref 0.1–1.0)
Monocytes Relative: 7 %
Neutro Abs: 4.7 10*3/uL (ref 1.7–7.7)
Neutrophils Relative %: 63 %
Platelet Count: 248 10*3/uL (ref 150–400)
RBC: 4.68 MIL/uL (ref 3.87–5.11)
RDW: 13.3 % (ref 11.5–15.5)
WBC Count: 7.5 10*3/uL (ref 4.0–10.5)
nRBC: 0 % (ref 0.0–0.2)

## 2022-06-05 LAB — CMP (CANCER CENTER ONLY)
ALT: 15 U/L (ref 0–44)
AST: 17 U/L (ref 15–41)
Albumin: 4.2 g/dL (ref 3.5–5.0)
Alkaline Phosphatase: 80 U/L (ref 38–126)
Anion gap: 6 (ref 5–15)
BUN: 19 mg/dL (ref 8–23)
CO2: 29 mmol/L (ref 22–32)
Calcium: 9.6 mg/dL (ref 8.9–10.3)
Chloride: 102 mmol/L (ref 98–111)
Creatinine: 0.76 mg/dL (ref 0.44–1.00)
GFR, Estimated: >60 mL/min (ref >60)
Glucose, Bld: 95 mg/dL (ref 70–99)
Potassium: 3.9 mmol/L (ref 3.5–5.1)
Sodium: 137 mmol/L (ref 135–145)
Total Bilirubin: 0.6 mg/dL (ref 0.3–1.2)
Total Protein: 7.1 g/dL (ref 6.5–8.1)

## 2022-06-05 LAB — GENETIC SCREENING ORDER

## 2022-06-05 LAB — RESEARCH LABS

## 2022-06-05 NOTE — Research (Signed)
[employer] 2021-05 - Specimen Collection Study to Evaluate Biomarkers in Subjects with Cancer   This Nurse has reviewed this patient's inclusion and exclusion criteria as a second review and confirms Jackie Montoya is eligible for study participation.  Patient may continue with enrollment.  Foye Spurling, BSN, RN, [employer] Research Nurse II 06/05/2022 3:00 PM

## 2022-06-05 NOTE — Research (Cosign Needed)
[employer] 2021-05 - Specimen Collection Study to Evaluate Biomarkers in Subjects with Cancer   CONSENT: Patient Jackie Montoya was identified by Dr Lindi Adie as a potential candidate for the above listed study. This Clinical Research Nurse met with Gayla Medicus, [id number], on 06/05/22 in a manner and location that ensures patient privacy to discuss participation in the above listed research study. Patient is Accompanied by her husband . A copy of the informed consent document with embedded HIPAA language was provided to the patient. Patient reads, speaks, and understands [place].    Patient was provided with the business card of this Nurse and encouraged to contact the research team with any questions.  Patient was provided the option of taking informed consent documents home to review and was encouraged to review at their convenience with their support network, including other care providers. Patient is comfortable with making a decision regarding study participation today.  As outlined in the informed consent form, this Nurse and Gayla Medicus discussed the purpose of the research study, the investigational nature of the study, study procedures and requirements for study participation, potential risks and benefits of study participation, as well as alternatives to participation. This study is not blinded. The patient understands participation is voluntary and they may withdraw from study participation at any time.  This study does not involve randomization.  This study does not involve an investigational drug or device. This study does not involve a placebo. Patient understands enrollment is pending full eligibility review.   Confidentiality and how the patient's information will be used as part of study participation were discussed. Patient was informed there is reimbursement provided for their time and effort spent on trial participation. The patient is encouraged to discuss research study  participation with their insurance provider to determine what costs they may incur as part of study participation, including research related injury.    All questions were answered to patient's satisfaction. The informed consent with embedded HIPAA language was reviewed page by page. The patient's mental and emotional status is appropriate to provide informed consent, and the patient verbalizes an understanding of study participation. Patient has agreed to participate in the above listed research study and has voluntarily signed the informed consent version with embedded HIPAA language, version 14  on 06/05/22 at 1440PM. The patient was provided with a copy of the signed informed consent form with embedded HIPAA language for their reference. No study specific procedures were obtained prior to the signing of the informed consent document. Approximately 30 minutes were spent with the patient reviewing the informed consent documents. Patient was not requested to complete a Release of Information form.  ELIGIBILITY:  This Nurse has reviewed this patient's inclusion and exclusion criteria and confirmed Jackie Montoya is eligible for study participation. Patient will continue with enrollment.  Eligibility confirmed by treating investigator, who also agrees that patient should proceed with enrollment.  SPECIMEN COLLECTION: Research blood specimen was collected using fresh venipuncture. Patient tolerated well.   TISSUE REQUEST: Tissue request sent for block from patient's biopsy on 05/28/22.   GIFT CARD: Patient was given a $64 [employer] gift card for her participation in the study.   DATA COLLECTION: Medical History:  High Blood Pressure              Yes Coronary Artery Disease        No Lupus  No Rheumatoid Arthritis               No Diabetes                                 No         If yes, which type?      N/A Lynch Syndrome                     No   Is the patient  currently taking a magnesium supplement?     No If yes, dose and frequency? N/A   Does the patient have a personal history of cancer (greater than 5 years ago)?     No If yes, Cancer type and date of diagnosis?    N/A Has this previous diagnosis been treated?    N/A       If so, treatment type? N/A   Start and end dates of last treatment cycle? N/A   Does the patient have a family history of cancer in 1st or 2nd degree relatives? Yes If yes, Relationship(s) and Cancer type(s)? Patient reports a history of lung and colon cancer in her sister, lung cancer in her paternal uncle, and colon cancer in her paternal grandmother.    Does the patient have history of alcohol consumption? Yes   If yes, current or former? Current If former, year stopped? N/A Number of years? 27 Drinks per week? 7   Does the patient have history of cigarette, cigar, pipe, or chewing tobacco use?  Yes  If yes, current for former? Former If yes, type (Cigarette, cigar, pipe, and/or chewing tobacco)? Cigarette   If former, year stopped? 1994 Number of years? 24 Packs/number/containers per day? 0.5   Patient was thanked for her time and voluntary participation in this study. Patient was provided direct contact information and is encouraged to contact this nurse for any questions or concerns.  Vickii Penna, RN, BSN, CPN Clinical Research Nurse I [phone]  06/05/2022 3:16 PM

## 2022-06-05 NOTE — Progress Notes (Signed)
REFERRING PROVIDER: Nicholas Lose, MD [address] [place],  [place] [address]  PRIMARY PROVIDER:  Kelton Pillar, MD  PRIMARY REASON FOR VISIT:  1. Malignant neoplasm of upper-inner quadrant of left breast in female, estrogen receptor positive ([place] [place])   2. Family history of breast cancer   3. Family history of colon cancer     HISTORY OF PRESENT ILLNESS:   Jackie Montoya, a 71 y.o. female, was seen for a [facility] cancer genetics consultation during the breast multidisciplinary clinic at the request of Dr. Lindi Adie due to a personal and family history of cancer.  Ms. Crook presents to clinic today to discuss the possibility of a hereditary predisposition to cancer, to discuss genetic testing, and to further clarify her future cancer risks, as well as potential cancer risks for family members.   In June 2023, at the age of 57, Ms. Sconyers was diagnosed with invasive ductal carcinoma of the left breast. The preliminary treatment plan is pending.   CANCER HISTORY:  Oncology History  Malignant neoplasm of upper-inner quadrant of left breast in female, estrogen receptor positive ([facility])  06/03/2022 Initial Diagnosis   Malignant neoplasm of upper-inner quadrant of left breast in female, estrogen receptor positive ([place] [facility])   06/05/2022 Cancer Staging   Staging form: Breast, AJCC 8th Edition - Clinical: Stage IA (cT1b, cN0, cM0, G1, ER+, PR+, HER2-) - Signed by Nicholas Lose, MD on 06/05/2022 Stage prefix: Initial diagnosis Histologic grading system: 3 grade system      RISK FACTORS:  Colonoscopy: yes;  most recent in Sept 2021; f/u planned in 3 year interval . Up to date with pelvic exams: PAP in 2021. Menarche was at age 20.  First live birth at age 72.  Menopausal status: postmenopausal.  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  Significant diagnoses are listed below: Family History  Problem Relation Age of Onset   Lung cancer Sister 6   Colon cancer Sister 64       colectomy   Lung cancer Paternal Uncle        smoking hx   Colon cancer Paternal Grandmother        ?   Lung cancer Cousin        smoking hx;  maternal female cousin   Breast cancer Cousin        dx before 37; paternal female cousin     Ms. Taylor is unaware of previous family history of genetic testing for hereditary cancer risks. There is no reported Ashkenazi Jewish ancestry. There is no known consanguinity.  GENETIC COUNSELING ASSESSMENT: Ms. Vogler is a 71 y.o. female with a personal and family history of cancer which is somewhat suggestive of a hereditary cancer syndrome and predisposition to cancer given the presence of related cancers in the family and the ages of diagnosis. We, therefore, discussed and recommended the following at today's visit.   DISCUSSION: We discussed that 5 - 10% of cancer is hereditary, with most cases of hereditary breast cancer associated with mutations in BRCA1/2.  There are other genes that can be  associated with hereditary breast and colon cancer syndromes.  These include but are not limited to CHEK2, ATM, and PALB2.  Type of cancer risk and level of risk are gene-specific. We discussed that testing is beneficial for several reasons including knowing how to follow individuals after completing their treatment, identifying whether potential treatment options would be beneficial, and understanding if other family members could be at risk for cancer and allowing them to undergo genetic testing.   We reviewed the characteristics, features and inheritance patterns of hereditary cancer syndromes. We also discussed genetic testing, including the appropriate family members to test, the process of testing, insurance coverage and turn-around-time for results. We discussed the implications of a negative, positive and/or variant of uncertain significant result. In order to get genetic test results in a timely manner so that Ms. Vanyo can use these genetic test results for surgical decisions, we recommended Ms. Mcniel pursue genetic testing for the [employer].  The BRCAplus panel offered by [employer] and includes sequencing and deletion/duplication analysis for the following 8 genes: ATM, BRCA1, BRCA2, CDH1, CHEK2, PALB2, PTEN, and TP53. Once complete, we recommend Ms. Pellegrini pursue reflex genetic testing to a more comprehensive gene panel.   The CustomNext-Cancer+RNAinsight panel offered by [facility] includes sequencing and rearrangement analysis for the following 47 genes:  APC, ATM, AXIN2, BARD1, BMPR1A, BRCA1, BRCA2, BRIP1, CDH1, CDK4, CDKN2A, CHEK2, DICER1, EPCAM, GREM1, HOXB13, MEN1, MLH1, MSH2, MSH3, MSH6, MUTYH, NBN, NF1, NF2, NTHL1, PALB2, PMS2, POLD1, POLE, PTEN, RAD51C, RAD51D, RECQL, RET, SDHA, SDHAF2, SDHB, SDHC, SDHD, SMAD4, SMARCA4, STK11, TP53, TSC1, TSC2, and VHL.  RNA data is routinely analyzed for use in variant interpretation for all genes.  Based on Ms. Bertram's personal and  family history of cancer, she meets medical criteria for genetic testing.   PLAN: After considering the risks, benefits, and limitations, Ms. Mastrogiovanni provided informed consent to pursue genetic testing and the blood sample was sent to [facility] for analysis of the [facility] and CustomNext-Cancer +RNAinsight. Results should be available within approximately 1-2 weeks' time, at which point they will be disclosed by telephone to Ms. Rothgeb, as will any additional recommendations warranted by these results. Ms. Metzer will receive a summary of her genetic counseling visit and a copy of her results once available. This information will also be available in Epic.   Ms. Baranski questions were answered to her satisfaction today. Our contact information was provided should additional questions or concerns arise. Thank you for the referral and allowing [place] to share in the care of your patient.   Ayriel Texidor M. Joette Catching, [place], [facility] Genetic Counselor [email or web address] (P) [phone]  The patient was seen for a  total of 20 minutes in face-to-face genetic counseling.  Patient was accompanied by her husband, Remo Lipps.  Drs. Lindi Adie and/or Burr Medico were available to discuss this case as needed.  _______________________________________________________________________ For Office Staff:  Number of people involved in session: 2 Was an Intern/ student involved with case: no

## 2022-06-05 NOTE — H&P (Signed)
Subjective   Chief Complaint: Breast Cancer     History of Present Illness: Jackie Montoya is a 71 y.o. female who is seen today as an office consultation at the request of Dr. Imaging for evaluation of Breast Cancer .   Breast MDC 06/05/22 G[place]/ Isidore Moos    [name] with no family history of breast cancer who presents after her recent screening mammogram revealed a suspicious area in the left upper outer quadrant.  She underwent further work-up which revealed a 0.4 x 0.4 x 0.6 cm mass located at 11:00, 5 cm from the nipple.  Biopsy was performed that revealed invasive ductal carcinoma grade 1, ER/PR positive, HER2 negative, Ki-67 10%.  Axilla was negative on ultrasound.  She presents now with her husband to discuss treatment options. Review of Systems: A complete review of systems was obtained from the patient.  I have reviewed this information and discussed as appropriate with the patient.  See HPI as well for other ROS.  Review of Systems  Constitutional: Negative.   HENT: Negative.    Eyes: Negative.   Respiratory:  Positive for shortness of breath.   Cardiovascular: Negative.   Gastrointestinal: Negative.   Genitourinary:  Positive for dysuria.  Musculoskeletal:  Positive for joint pain and myalgias.  Skin: Negative.   Neurological: Negative.   Endo/Heme/Allergies:  Bruises/bleeds easily.  Psychiatric/Behavioral:  The patient is nervous/anxious.      Medical History: Past Medical History:  Diagnosis Date   Anxiety    Arthritis    GERD (gastroesophageal reflux disease)    Hyperlipidemia    Hypertension    Sleep apnea     Patient Active Problem List  Diagnosis   Essential hypertension   Malignant neoplasm of upper-inner quadrant of left breast in female, estrogen receptor positive (CMS-HCC)   Mixed hyperlipidemia   Invasive ductal carcinoma of breast, left (CMS-HCC)    Past Surgical History:  Procedure Laterality Date   FUNCTIONAL ENDOSCOPIC  SINUS SURGERY     x 2   PERCUTANEOUS BIOPSY BREAST Left    x 2     Allergies  Allergen Reactions   Codeine Nausea And Vomiting    Current Outpatient Medications on File Prior to Visit  Medication Sig Dispense Refill   albuterol 90 mcg/actuation inhaler INHALE 2 PUFFS BY MOUTH EVERY 4 HOURS AS NEEDED FOR WHEEZING AND COUGH     semaglutide (OZEMPIC) 2 mg/dose (8 mg/3 mL) pen injector Inject 2 mg as directed once a week.     amLODIPine (NORVASC) 5 MG tablet amlodipine 5 mg tablet     atorvastatin (LIPITOR) 20 MG tablet atorvastatin 20 mg tablet     buPROPion (WELLBUTRIN XL) 300 MG XL tablet bupropion HCl XL 300 mg 24 hr tablet, extended release  TAKE 1 TABLET BY MOUTH EVERY MORNING     FLUoxetine (PROZAC) 40 MG capsule Take 40 mg by mouth once daily     fluticasone propionate (FLONASE) 50 mcg/actuation nasal spray fluticasone propionate 50 mcg/actuation nasal spray,suspension     hydroCHLOROthiazide (HYDRODIURIL) 25 MG tablet hydrochlorothiazide 25 mg tablet     loratadine (CLARITIN) 10 mg tablet Take 10 mg by mouth once daily     losartan (COZAAR) 100 MG tablet losartan 100 mg tablet     multivitamin tablet Take 1 tablet by mouth     omeprazole (PRILOSEC) 40 MG DR capsule omeprazole 40 mg capsule,delayed release     No current facility-administered medications on file prior to visit.  Family History  Problem Relation Age of Onset   High blood pressure (Hypertension) Mother    Hyperlipidemia (Elevated cholesterol) Mother    Deep vein thrombosis (DVT or abnormal blood clot formation) Sister    Lung cancer Sister    Stroke Maternal Grandfather    Coronary Artery Disease (Blocked arteries around heart) Paternal Grandmother    Diabetes Paternal Grandmother    Deep vein thrombosis (DVT or abnormal blood clot formation) Paternal Grandmother      Social History   Tobacco Use  Smoking Status Former   Types: Cigarettes  Smokeless Tobacco Never  Tobacco Comments   Quit at 71  years of age     Social History   Socioeconomic History   Marital status: Married  Tobacco Use   Smoking status: Former    Types: Cigarettes   Smokeless tobacco: Never   Tobacco comments:    Quit at 71 years of age  80 Use   Vaping Use: Never used  Substance and Sexual Activity   Alcohol use: Yes    Comment: 7 a week   Drug use: Never    Objective:    Physical Exam   Constitutional:  WDWN in NAD, conversant, no obvious deformities; lying in bed comfortably Eyes:  Pupils equal, round; sclera anicteric; moist conjunctiva; no lid lag HENT:  Oral mucosa moist; good dentition  Neck:  No masses palpated, trachea midline; no thyromegaly Lungs:  CTA bilaterally; normal respiratory effort Breasts:  symmetric, no nipple changes; no palpable lymphadenopathy in either axilla, no right breast masses, firm palpable 2 cm hematoma in the left upper inner quadrant corresponding to the biopsy site.  The patient states that there was no palpable masses area prior to the biopsy.  There is some overlying ecchymosis. CV:  Regular rate and rhythm; no murmurs; extremities well-perfused with no edema Abd:  +bowel sounds, soft, non-tender, no palpable organomegaly; no palpable hernias Musc:  Unable to assess gait; no apparent clubbing or cyanosis in extremities Lymphatic:  No palpable cervical or axillary lymphadenopathy Skin:  Warm, dry; no sign of jaundice Psychiatric - alert and oriented x 4; calm mood and affect   Labs, Imaging and Diagnostic Testing: Breast, left, needle core biopsy, 11:00 INVASIVE WELL DIFFERENTIATED DUCTAL ADENOCARCINOMA, GRADE 1 (2+2+1) DUCTAL CARCINOMA IN SITU, INTERMEDIATE NUCLEAR GRADE, SOLID AND CRIBRIFORM TYPES WITHOUT NECROSIS MICROCALCIFICATIONS PRESENT WITHIN INVASIVE TUMOR Microscopic Comment Tumor measures 6 mm in greatest linear extent. The breast prognostic markers have been ordered and the results Montoya be issued in a subsequent addendum to this  report. Case is reviewed by Dr. Thornell Sartorius who concurs with the diagnosis. Diagnosis called to [facility] [place] by Dr. Alric Seton on 05/29/2022 @ 9:10 AM. Tobin Chad MD Pathologist, Electronic Signature (Case signed 05/29/2022) IMMUNOHISTOCHEMICAL AND MORPHOMETRIC ANALYSIS PERFORMED MANUALLY The tumor cells are EQUIVOCAL for Her (2+). Her2 by FISH Montoya be performed and the results Montoya be reported separately. Estrogen Receptor: 100%, POSITIVE, STRONG STAINING INTENSITY Progesterone Receptor: 95%, POSITIVE, STRONG STAINING INTENSITY Proliferation Marker Ki67: 10%  CLINICAL DATA:  71 year old female for further evaluation of 2 possible LEFT breast asymmetries identified on screening mammogram.   EXAM: DIGITAL DIAGNOSTIC UNILATERAL LEFT MAMMOGRAM WITH TOMOSYNTHESIS AND CAD; ULTRASOUND LEFT BREAST LIMITED   TECHNIQUE: Left digital diagnostic mammography and breast tomosynthesis was performed. The images were evaluated with computer-aided detection.; Targeted ultrasound examination of the left breast was performed.   COMPARISON:  Previous exam(s).   ACR Breast Density Category b: There  are scattered areas of fibroglandular density.   FINDINGS: Full field and spot compression views of the LEFT breast demonstrate a persistent 0.6 cm irregular mass within the UPPER LEFT breast, middle depth. No other persistent suspicious abnormalities are noted.   Targeted ultrasound is performed, showing a 0.4 x 0.4 x 0.6 cm irregular hypoechoic mass at the 11 o'clock position of the LEFT breast 5 cm from the nipple, a good correlate to the mammographic finding.   No abnormal LEFT axillary lymph nodes are noted.   IMPRESSION: 1. Suspicious 0.6 cm UPPER INNER LEFT breast mass. Tissue sampling is recommended. 2. No abnormal appearing LEFT axillary lymph nodes.   RECOMMENDATION: Ultrasound-guided LEFT breast biopsy, which Montoya be scheduled.   I have  discussed the findings and recommendations with the patient. If applicable, a reminder letter Montoya be sent to the patient regarding the next appointment.   BI-RADS CATEGORY  4: Suspicious.     Electronically Signed   By: Margarette Canada M.D.   On: 05/20/2022 12:30     Assessment and Plan:  Diagnoses and all orders for this visit:  Invasive ductal carcinoma of breast, left (CMS-HCC)    After thorough discussion with the patient as well as the treatment team, we plan to perform a left breast radioactive seed localized lumpectomy.  Pending final pathology, she would likely be a candidate for adjuvant radiation as well as antiestrogens.  Based on the low-grade tumor and the small size of the tumor, there are no strong indications for sentinel lymph node biopsy.  We discussed all of this with the patient who agrees to proceed.  Plan left radioactive seed localized lumpectomy.The surgical procedure has been discussed with the patient.  Potential risks, benefits, alternative treatments, and expected outcomes have been explained.  All of the patient's questions at this time have been answered.  The likelihood of reaching the patient's treatment goal is good.  The patient understand the proposed surgical procedure and wishes to proceed.   Jackie Montoya Jearld Adjutant, MD  06/05/2022 3:02 PM

## 2022-06-05 NOTE — H&P (View-Only) (Signed)
Subjective   Chief Complaint: Breast Cancer     History of Present Illness: Jackie Montoya is a 71 y.o. female who is seen today as an office consultation at the request of Dr. Imaging for evaluation of Breast Cancer .   Breast MDC 06/05/22 Jackie Montoya/ Jackie Montoya    [name] with no family history of breast cancer who presents after her recent screening mammogram revealed a suspicious area in the left upper outer quadrant.  She underwent further work-up which revealed a 0.4 x 0.4 x 0.6 cm mass located at 11:00, 5 cm from the nipple.  Biopsy was performed that revealed invasive ductal carcinoma grade 1, ER/PR positive, HER2 negative, Ki-67 10%.  Axilla was negative on ultrasound.  She presents now with her husband to discuss treatment options. Review of Systems: A complete review of systems was obtained from the patient.  I have reviewed this information and discussed as appropriate with the patient.  See HPI as well for other ROS.  Review of Systems  Constitutional: Negative.   HENT: Negative.    Eyes: Negative.   Respiratory:  Positive for shortness of breath.   Cardiovascular: Negative.   Gastrointestinal: Negative.   Genitourinary:  Positive for dysuria.  Musculoskeletal:  Positive for joint pain and myalgias.  Skin: Negative.   Neurological: Negative.   Endo/Heme/Allergies:  Bruises/bleeds easily.  Psychiatric/Behavioral:  The patient is nervous/anxious.      Medical History: Past Medical History:  Diagnosis Date   Anxiety    Arthritis    GERD (gastroesophageal reflux disease)    Hyperlipidemia    Hypertension    Sleep apnea     Patient Active Problem List  Diagnosis   Essential hypertension   Malignant neoplasm of upper-inner quadrant of left breast in female, estrogen receptor positive (CMS-HCC)   Mixed hyperlipidemia   Invasive ductal carcinoma of breast, left (CMS-HCC)    Past Surgical History:  Procedure Laterality Date   FUNCTIONAL ENDOSCOPIC  SINUS SURGERY     x 2   PERCUTANEOUS BIOPSY BREAST Left    x 2     Allergies  Allergen Reactions   Codeine Nausea And Vomiting    Current Outpatient Medications on File Prior to Visit  Medication Sig Dispense Refill   albuterol 90 mcg/actuation inhaler INHALE 2 PUFFS BY MOUTH EVERY 4 HOURS AS NEEDED FOR WHEEZING AND COUGH     semaglutide (OZEMPIC) 2 mg/dose (8 mg/3 mL) pen injector Inject 2 mg as directed once a week.     amLODIPine (NORVASC) 5 MG tablet amlodipine 5 mg tablet     atorvastatin (LIPITOR) 20 MG tablet atorvastatin 20 mg tablet     buPROPion (WELLBUTRIN XL) 300 MG XL tablet bupropion HCl XL 300 mg 24 hr tablet, extended release  TAKE 1 TABLET BY MOUTH EVERY MORNING     FLUoxetine (PROZAC) 40 MG capsule Take 40 mg by mouth once daily     fluticasone propionate (FLONASE) 50 mcg/actuation nasal spray fluticasone propionate 50 mcg/actuation nasal spray,suspension     hydroCHLOROthiazide (HYDRODIURIL) 25 MG tablet hydrochlorothiazide 25 mg tablet     loratadine (CLARITIN) 10 mg tablet Take 10 mg by mouth once daily     losartan (COZAAR) 100 MG tablet losartan 100 mg tablet     multivitamin tablet Take 1 tablet by mouth     omeprazole (PRILOSEC) 40 MG DR capsule omeprazole 40 mg capsule,delayed release     No current facility-administered medications on file prior to visit.  Family History  Problem Relation Age of Onset   High blood pressure (Hypertension) Mother    Hyperlipidemia (Elevated cholesterol) Mother    Deep vein thrombosis (DVT or abnormal blood clot formation) Sister    Lung cancer Sister    Stroke Maternal Grandfather    Coronary Artery Disease (Blocked arteries around heart) Paternal Grandmother    Diabetes Paternal Grandmother    Deep vein thrombosis (DVT or abnormal blood clot formation) Paternal Grandmother      Social History   Tobacco Use  Smoking Status Former   Types: Cigarettes  Smokeless Tobacco Never  Tobacco Comments   Quit at 71  years of age     Social History   Socioeconomic History   Marital status: Married  Tobacco Use   Smoking status: Former    Types: Cigarettes   Smokeless tobacco: Never   Tobacco comments:    Quit at 71 years of age  15 Use   Vaping Use: Never used  Substance and Sexual Activity   Alcohol use: Yes    Comment: 7 a week   Drug use: Never    Objective:    Physical Exam   Constitutional:  WDWN in NAD, conversant, no obvious deformities; lying in bed comfortably Eyes:  Pupils equal, round; sclera anicteric; moist conjunctiva; no lid lag HENT:  Oral mucosa moist; good dentition  Neck:  No masses palpated, trachea midline; no thyromegaly Lungs:  CTA bilaterally; normal respiratory effort Breasts:  symmetric, no nipple changes; no palpable lymphadenopathy in either axilla, no right breast masses, firm palpable 2 cm hematoma in the left upper inner quadrant corresponding to the biopsy site.  The patient states that there was no palpable masses area prior to the biopsy.  There is some overlying ecchymosis. CV:  Regular rate and rhythm; no murmurs; extremities well-perfused with no edema Abd:  +bowel sounds, soft, non-tender, no palpable organomegaly; no palpable hernias Musc:  Unable to assess gait; no apparent clubbing or cyanosis in extremities Lymphatic:  No palpable cervical or axillary lymphadenopathy Skin:  Warm, dry; no sign of jaundice Psychiatric - alert and oriented x 4; calm mood and affect   Labs, Imaging and Diagnostic Testing: Breast, left, needle core biopsy, 11:00 INVASIVE WELL DIFFERENTIATED DUCTAL ADENOCARCINOMA, GRADE 1 (2+2+1) DUCTAL CARCINOMA IN SITU, INTERMEDIATE NUCLEAR GRADE, SOLID AND CRIBRIFORM TYPES WITHOUT NECROSIS MICROCALCIFICATIONS PRESENT WITHIN INVASIVE TUMOR Microscopic Comment Tumor measures 6 mm in greatest linear extent. The breast prognostic markers have been ordered and the results will be issued in a subsequent addendum to this  report. Case is reviewed by Dr. Thornell Montoya who concurs with the diagnosis. Diagnosis called to [facility] at [place] by Dr. Alric Montoya on 05/29/2022 @ 9:10 AM. Jackie Chad MD Pathologist, Electronic Signature (Case signed 05/29/2022) IMMUNOHISTOCHEMICAL AND MORPHOMETRIC ANALYSIS PERFORMED MANUALLY The tumor cells are EQUIVOCAL for Her (2+). Her2 by FISH will be performed and the results will be reported separately. Estrogen Receptor: 100%, POSITIVE, STRONG STAINING INTENSITY Progesterone Receptor: 95%, POSITIVE, STRONG STAINING INTENSITY Proliferation Marker Ki67: 10%  CLINICAL DATA:  71 year old female for further evaluation of 2 possible LEFT breast asymmetries identified on screening mammogram.   EXAM: DIGITAL DIAGNOSTIC UNILATERAL LEFT MAMMOGRAM WITH TOMOSYNTHESIS AND CAD; ULTRASOUND LEFT BREAST LIMITED   TECHNIQUE: Left digital diagnostic mammography and breast tomosynthesis was performed. The images were evaluated with computer-aided detection.; Targeted ultrasound examination of the left breast was performed.   COMPARISON:  Previous exam(s).   ACR Breast Density Category b: There  are scattered areas of fibroglandular density.   FINDINGS: Full field and spot compression views of the LEFT breast demonstrate a persistent 0.6 cm irregular mass within the UPPER LEFT breast, middle depth. No other persistent suspicious abnormalities are noted.   Targeted ultrasound is performed, showing a 0.4 x 0.4 x 0.6 cm irregular hypoechoic mass at the 11 o'clock position of the LEFT breast 5 cm from the nipple, a good correlate to the mammographic finding.   No abnormal LEFT axillary lymph nodes are noted.   IMPRESSION: 1. Suspicious 0.6 cm UPPER INNER LEFT breast mass. Tissue sampling is recommended. 2. No abnormal appearing LEFT axillary lymph nodes.   RECOMMENDATION: Ultrasound-guided LEFT breast biopsy, which will be scheduled.   I have  discussed the findings and recommendations with the patient. If applicable, a reminder letter will be sent to the patient regarding the next appointment.   BI-RADS CATEGORY  4: Suspicious.     Electronically Signed   By: Margarette Canada M.D.   On: 05/20/2022 12:30     Assessment and Plan:  Diagnoses and all orders for this visit:  Invasive ductal carcinoma of breast, left (CMS-HCC)    After thorough discussion with the patient as well as the treatment team, we plan to perform a left breast radioactive seed localized lumpectomy.  Pending final pathology, she would likely be a candidate for adjuvant radiation as well as antiestrogens.  Based on the low-grade tumor and the small size of the tumor, there are no strong indications for sentinel lymph node biopsy.  We discussed all of this with the patient who agrees to proceed.  Plan left radioactive seed localized lumpectomy.The surgical procedure has been discussed with the patient.  Potential risks, benefits, alternative treatments, and expected outcomes have been explained.  All of the patient's questions at this time have been answered.  The likelihood of reaching the patient's treatment goal is good.  The patient understand the proposed surgical procedure and wishes to proceed.   Nesbit Michon Jearld Adjutant, MD  06/05/2022 3:02 PM

## 2022-06-05 NOTE — Assessment & Plan Note (Signed)
05/28/2022 screening mammogram detected left breast asymmetry at 11 o'clock position measuring 0.6 cm.  Ultrasound-guided biopsy revealed grade 1 IDC with DCIS intermediate grade, ER 100%, PR 95%, Ki-67 10%, HER2 negative, axilla negative  Pathology and radiology counseling: Discussed with the patient, the details of pathology including the type of breast cancer,the clinical staging, the significance of ER, PR and HER-2/neu receptors and the implications for treatment. After reviewing the pathology in detail, we proceeded to discuss the different treatment options between surgery, radiation, chemotherapy, antiestrogen therapies.  Treatment plan: 1.  Left lumpectomy 2. +/- XRT 3.  Adjuvant antiestrogen therapy Patient is on board with the treatment plan. Return to clinic after surgery to discuss final pathology report.

## 2022-06-06 ENCOUNTER — Telehealth: Payer: Self-pay | Admitting: *Deleted

## 2022-06-06 ENCOUNTER — Encounter: Payer: Self-pay | Admitting: *Deleted

## 2022-06-06 NOTE — Telephone Encounter (Signed)
Spoke to pt concerning [place] from 6.21.23. Denies questions or concerns regarding dx or treatment care plan. Encourage pt to call with needs.

## 2022-06-10 ENCOUNTER — Encounter: Payer: Self-pay | Admitting: General Practice

## 2022-06-13 ENCOUNTER — Telehealth: Payer: Self-pay | Admitting: Genetic Counselor

## 2022-06-13 ENCOUNTER — Encounter: Payer: Self-pay | Admitting: Genetic Counselor

## 2022-06-13 ENCOUNTER — Ambulatory Visit: Payer: Self-pay | Admitting: Genetic Counselor

## 2022-06-13 DIAGNOSIS — Z800 Family history of malignant neoplasm of digestive organs: Secondary | ICD-10-CM

## 2022-06-13 DIAGNOSIS — Z1379 Encounter for other screening for genetic and chromosomal anomalies: Secondary | ICD-10-CM | POA: Insufficient documentation

## 2022-06-13 DIAGNOSIS — C50212 Malignant neoplasm of upper-inner quadrant of left female breast: Secondary | ICD-10-CM

## 2022-06-13 DIAGNOSIS — Z803 Family history of malignant neoplasm of breast: Secondary | ICD-10-CM

## 2022-06-13 NOTE — Telephone Encounter (Signed)
Revealed negative genetic testing for BRCAPlus Panel.  Discussed that we do not know why she has breast cancer. It could be sporadic/famillial, due to a change in a gene that she did not inherit, due to a different gene that we are not testing, or maybe our current technology may not be able to pick something up.  It will be important for her to keep in contact with genetics to keep up with whether additional testing may be needed.    Results of pan-cancer panel are pending.

## 2022-06-20 ENCOUNTER — Encounter: Payer: Self-pay | Admitting: *Deleted

## 2022-06-24 ENCOUNTER — Other Ambulatory Visit: Payer: Self-pay | Admitting: Surgery

## 2022-06-24 ENCOUNTER — Ambulatory Visit: Payer: Self-pay | Admitting: Surgery

## 2022-06-24 ENCOUNTER — Encounter (HOSPITAL_BASED_OUTPATIENT_CLINIC_OR_DEPARTMENT_OTHER): Payer: Self-pay | Admitting: Surgery

## 2022-06-24 ENCOUNTER — Other Ambulatory Visit: Payer: Self-pay

## 2022-06-24 DIAGNOSIS — C50912 Malignant neoplasm of unspecified site of left female breast: Secondary | ICD-10-CM

## 2022-06-25 ENCOUNTER — Telehealth: Payer: Self-pay | Admitting: Hematology and Oncology

## 2022-06-25 NOTE — Telephone Encounter (Signed)
.  Called patient to schedule appointment per 7/10 inbasket, patient is aware of date and time.   

## 2022-06-26 ENCOUNTER — Ambulatory Visit
Admission: RE | Admit: 2022-06-26 | Discharge: 2022-06-26 | Disposition: A | Discharge status: Home / Self Care | Payer: Medicare Other | Source: Ambulatory Visit | Attending: Surgery | Admitting: Surgery

## 2022-06-26 ENCOUNTER — Encounter (HOSPITAL_BASED_OUTPATIENT_CLINIC_OR_DEPARTMENT_OTHER)
Admission: RE | Admit: 2022-06-26 | Discharge: 2022-06-26 | Disposition: A | Discharge status: Home / Self Care | Payer: Medicare Other | Source: Ambulatory Visit | Attending: Surgery | Admitting: Surgery

## 2022-06-26 ENCOUNTER — Ambulatory Visit (INDEPENDENT_AMBULATORY_CARE_PROVIDER_SITE_OTHER): Payer: Medicare Other | Admitting: Adult Health

## 2022-06-26 DIAGNOSIS — C50212 Malignant neoplasm of upper-inner quadrant of left female breast: Secondary | ICD-10-CM | POA: Diagnosis not present

## 2022-06-26 DIAGNOSIS — F32A Depression, unspecified: Secondary | ICD-10-CM | POA: Diagnosis not present

## 2022-06-26 DIAGNOSIS — F419 Anxiety disorder, unspecified: Secondary | ICD-10-CM | POA: Diagnosis not present

## 2022-06-26 DIAGNOSIS — Z87891 Personal history of nicotine dependence: Secondary | ICD-10-CM | POA: Diagnosis not present

## 2022-06-26 DIAGNOSIS — Z6839 Body mass index (BMI) 39.0-39.9, adult: Secondary | ICD-10-CM | POA: Diagnosis not present

## 2022-06-26 DIAGNOSIS — I10 Essential (primary) hypertension: Secondary | ICD-10-CM | POA: Diagnosis not present

## 2022-06-26 DIAGNOSIS — G4730 Sleep apnea, unspecified: Secondary | ICD-10-CM | POA: Diagnosis not present

## 2022-06-26 DIAGNOSIS — Z170 Estrogen receptor positive status [ER+]: Secondary | ICD-10-CM | POA: Diagnosis not present

## 2022-06-26 DIAGNOSIS — C50912 Malignant neoplasm of unspecified site of left female breast: Secondary | ICD-10-CM

## 2022-06-26 DIAGNOSIS — C50812 Malignant neoplasm of overlapping sites of left female breast: Secondary | ICD-10-CM | POA: Diagnosis not present

## 2022-06-26 DIAGNOSIS — K219 Gastro-esophageal reflux disease without esophagitis: Secondary | ICD-10-CM | POA: Diagnosis not present

## 2022-06-26 MED ORDER — CHLORHEXIDINE GLUCONATE CLOTH 2 % EX PADS: 6.0000 | MEDICATED_PAD | Freq: Once | CUTANEOUS | Status: DC

## 2022-06-26 NOTE — Progress Notes (Signed)

## 2022-06-27 ENCOUNTER — Ambulatory Visit (HOSPITAL_BASED_OUTPATIENT_CLINIC_OR_DEPARTMENT_OTHER): Payer: Medicare Other | Admitting: Anesthesiology

## 2022-06-27 ENCOUNTER — Encounter (HOSPITAL_BASED_OUTPATIENT_CLINIC_OR_DEPARTMENT_OTHER)
Admission: RE | Disposition: A | Discharge status: Home / Self Care | Payer: Self-pay | Source: Home / Self Care | Attending: Surgery

## 2022-06-27 ENCOUNTER — Ambulatory Visit
Admission: RE | Admit: 2022-06-27 | Discharge: 2022-06-27 | Disposition: A | Discharge status: Home / Self Care | Payer: Medicare Other | Source: Ambulatory Visit | Attending: Surgery | Admitting: Surgery

## 2022-06-27 ENCOUNTER — Encounter (HOSPITAL_BASED_OUTPATIENT_CLINIC_OR_DEPARTMENT_OTHER): Payer: Self-pay | Admitting: Surgery

## 2022-06-27 ENCOUNTER — Other Ambulatory Visit: Payer: Self-pay

## 2022-06-27 ENCOUNTER — Ambulatory Visit (HOSPITAL_BASED_OUTPATIENT_CLINIC_OR_DEPARTMENT_OTHER)
Admission: RE | Admit: 2022-06-27 | Discharge: 2022-06-27 | Disposition: A | Discharge status: Home / Self Care | Payer: Medicare Other | Attending: Surgery | Admitting: Surgery

## 2022-06-27 DIAGNOSIS — G4733 Obstructive sleep apnea (adult) (pediatric): Secondary | ICD-10-CM | POA: Diagnosis not present

## 2022-06-27 DIAGNOSIS — I10 Essential (primary) hypertension: Secondary | ICD-10-CM

## 2022-06-27 DIAGNOSIS — Z87891 Personal history of nicotine dependence: Secondary | ICD-10-CM | POA: Diagnosis not present

## 2022-06-27 DIAGNOSIS — F32A Depression, unspecified: Secondary | ICD-10-CM | POA: Diagnosis not present

## 2022-06-27 DIAGNOSIS — I2510 Atherosclerotic heart disease of native coronary artery without angina pectoris: Secondary | ICD-10-CM

## 2022-06-27 DIAGNOSIS — Z170 Estrogen receptor positive status [ER+]: Secondary | ICD-10-CM | POA: Insufficient documentation

## 2022-06-27 DIAGNOSIS — F418 Other specified anxiety disorders: Secondary | ICD-10-CM | POA: Diagnosis not present

## 2022-06-27 DIAGNOSIS — C50912 Malignant neoplasm of unspecified site of left female breast: Secondary | ICD-10-CM

## 2022-06-27 DIAGNOSIS — Z9989 Dependence on other enabling machines and devices: Secondary | ICD-10-CM | POA: Diagnosis not present

## 2022-06-27 DIAGNOSIS — K219 Gastro-esophageal reflux disease without esophagitis: Secondary | ICD-10-CM | POA: Insufficient documentation

## 2022-06-27 DIAGNOSIS — Z6839 Body mass index (BMI) 39.0-39.9, adult: Secondary | ICD-10-CM | POA: Insufficient documentation

## 2022-06-27 DIAGNOSIS — F419 Anxiety disorder, unspecified: Secondary | ICD-10-CM | POA: Insufficient documentation

## 2022-06-27 DIAGNOSIS — R928 Other abnormal and inconclusive findings on diagnostic imaging of breast: Secondary | ICD-10-CM | POA: Diagnosis not present

## 2022-06-27 DIAGNOSIS — G4730 Sleep apnea, unspecified: Secondary | ICD-10-CM | POA: Diagnosis not present

## 2022-06-27 DIAGNOSIS — C50212 Malignant neoplasm of upper-inner quadrant of left female breast: Secondary | ICD-10-CM | POA: Insufficient documentation

## 2022-06-27 HISTORY — PX: BREAST LUMPECTOMY WITH RADIOACTIVE SEED LOCALIZATION: SHX6424

## 2022-06-27 SURGERY — BREAST LUMPECTOMY WITH RADIOACTIVE SEED LOCALIZATION
Anesthesia: General | Site: Breast | Laterality: Left

## 2022-06-27 MED ORDER — OXYCODONE HCL 5 MG PO TABS: 5.0000 mg | ORAL_TABLET | Freq: Once | ORAL | Status: DC | PRN

## 2022-06-27 MED ORDER — ONDANSETRON HCL 4 MG/2ML IJ SOLN
INTRAMUSCULAR | Status: AC
  Filled 2022-06-27: qty 2

## 2022-06-27 MED ORDER — ONDANSETRON HCL 4 MG/2ML IJ SOLN
INTRAMUSCULAR | Status: DC | PRN
  Administered 2022-06-27: 4 mg via INTRAVENOUS

## 2022-06-27 MED ORDER — FENTANYL CITRATE (PF) 100 MCG/2ML IJ SOLN
25.0000 ug | INTRAMUSCULAR | Status: DC | PRN
  Administered 2022-06-27 (×2): 50 ug via INTRAVENOUS

## 2022-06-27 MED ORDER — MIDAZOLAM HCL 5 MG/5ML IJ SOLN
INTRAMUSCULAR | Status: DC | PRN
  Administered 2022-06-27: 2 mg via INTRAVENOUS

## 2022-06-27 MED ORDER — FENTANYL CITRATE (PF) 100 MCG/2ML IJ SOLN
INTRAMUSCULAR | Status: DC | PRN
  Administered 2022-06-27 (×2): 50 ug via INTRAVENOUS

## 2022-06-27 MED ORDER — ACETAMINOPHEN 500 MG PO TABS
ORAL_TABLET | ORAL | Status: AC
  Filled 2022-06-27: qty 2

## 2022-06-27 MED ORDER — CEFAZOLIN SODIUM-DEXTROSE 2-4 GM/100ML-% IV SOLN
INTRAVENOUS | Status: AC
  Filled 2022-06-27: qty 100

## 2022-06-27 MED ORDER — DEXAMETHASONE SODIUM PHOSPHATE 10 MG/ML IJ SOLN
INTRAMUSCULAR | Status: DC | PRN
  Administered 2022-06-27: 5 mg via INTRAVENOUS

## 2022-06-27 MED ORDER — ACETAMINOPHEN 500 MG PO TABS
1000.0000 mg | ORAL_TABLET | Freq: Once | ORAL | Status: AC
  Administered 2022-06-27: 1000 mg via ORAL

## 2022-06-27 MED ORDER — AMISULPRIDE (ANTIEMETIC) 5 MG/2ML IV SOLN: 10.0000 mg | Freq: Once | INTRAVENOUS | Status: DC | PRN

## 2022-06-27 MED ORDER — ONDANSETRON HCL 4 MG/2ML IJ SOLN: 4.0000 mg | Freq: Once | INTRAMUSCULAR | Status: DC | PRN

## 2022-06-27 MED ORDER — FENTANYL CITRATE (PF) 100 MCG/2ML IJ SOLN
INTRAMUSCULAR | Status: AC
  Filled 2022-06-27: qty 2

## 2022-06-27 MED ORDER — OXYCODONE HCL 5 MG/5ML PO SOLN: 5.0000 mg | Freq: Once | ORAL | Status: DC | PRN

## 2022-06-27 MED ORDER — LIDOCAINE HCL 1 % IJ SOLN
INTRAMUSCULAR | Status: DC | PRN
  Administered 2022-06-27: 60 mg via INTRADERMAL

## 2022-06-27 MED ORDER — LACTATED RINGERS IV SOLN: INTRAVENOUS | Status: DC

## 2022-06-27 MED ORDER — CEFAZOLIN SODIUM-DEXTROSE 2-4 GM/100ML-% IV SOLN
2.0000 g | INTRAVENOUS | Status: AC
  Administered 2022-06-27: 2 g via INTRAVENOUS

## 2022-06-27 MED ORDER — ACETAMINOPHEN 500 MG PO TABS: 1000.0000 mg | ORAL_TABLET | ORAL | Status: DC

## 2022-06-27 MED ORDER — MIDAZOLAM HCL 2 MG/2ML IJ SOLN
INTRAMUSCULAR | Status: AC
  Filled 2022-06-27: qty 2

## 2022-06-27 MED ORDER — BUPIVACAINE-EPINEPHRINE 0.25% -1:200000 IJ SOLN
INTRAMUSCULAR | Status: DC | PRN
  Administered 2022-06-27: 10 mL

## 2022-06-27 MED ORDER — PHENYLEPHRINE 80 MCG/ML (10ML) SYRINGE FOR IV PUSH (FOR BLOOD PRESSURE SUPPORT)
PREFILLED_SYRINGE | INTRAVENOUS | Status: DC | PRN
  Administered 2022-06-27: 80 ug via INTRAVENOUS

## 2022-06-27 MED ORDER — PROPOFOL 10 MG/ML IV BOLUS
INTRAVENOUS | Status: DC | PRN
  Administered 2022-06-27: 50 mg via INTRAVENOUS
  Administered 2022-06-27: 150 mg via INTRAVENOUS
  Administered 2022-06-27 (×2): 50 mg via INTRAVENOUS

## 2022-06-27 MED ORDER — PROPOFOL 10 MG/ML IV BOLUS
INTRAVENOUS | Status: AC
  Filled 2022-06-27: qty 20

## 2022-06-27 SURGICAL SUPPLY — 52 items
APL PRP STRL LF DISP 70% ISPRP (MISCELLANEOUS) ×1
APL SKNCLS STERI-STRIP NONHPOA (GAUZE/BANDAGES/DRESSINGS) ×1
APPLIER CLIP 9.375 MED OPEN (MISCELLANEOUS) ×2
APR CLP MED 9.3 20 MLT OPN (MISCELLANEOUS) ×1
BENZOIN TINCTURE PRP APPL 2/3 (GAUZE/BANDAGES/DRESSINGS) ×2 IMPLANT
BLADE HEX COATED 2.75 (ELECTRODE) ×2 IMPLANT
BLADE SURG 15 STRL LF DISP TIS (BLADE) ×1 IMPLANT
BLADE SURG 15 STRL SS (BLADE) ×2
CANISTER SUC SOCK COL 7IN (MISCELLANEOUS) IMPLANT
CANISTER SUCT 1200ML W/VALVE (MISCELLANEOUS) ×1 IMPLANT
CHLORAPREP W/TINT 26 (MISCELLANEOUS) ×2 IMPLANT
CLIP APPLIE 9.375 MED OPEN (MISCELLANEOUS) ×1 IMPLANT
COVER BACK TABLE 60X90IN (DRAPES) ×2 IMPLANT
COVER MAYO STAND STRL (DRAPES) ×2 IMPLANT
COVER PROBE W GEL 5X96 (DRAPES) ×2 IMPLANT
DRAPE LAPAROTOMY 100X72 PEDS (DRAPES) ×2 IMPLANT
DRAPE UTILITY XL STRL (DRAPES) ×2 IMPLANT
DRSG TEGADERM 4X4.75 (GAUZE/BANDAGES/DRESSINGS) ×2 IMPLANT
ELECT REM PT RETURN 9FT ADLT (ELECTROSURGICAL) ×2
ELECTRODE REM PT RTRN 9FT ADLT (ELECTROSURGICAL) ×1 IMPLANT
GAUZE SPONGE 4X4 12PLY STRL LF (GAUZE/BANDAGES/DRESSINGS) ×1 IMPLANT
GLOVE BIO SURGEON STRL SZ7 (GLOVE) ×2 IMPLANT
GLOVE BIOGEL PI IND STRL 7.5 (GLOVE) ×1 IMPLANT
GLOVE BIOGEL PI IND STRL 8 (GLOVE) IMPLANT
GLOVE BIOGEL PI INDICATOR 7.5 (GLOVE) ×1
GLOVE BIOGEL PI INDICATOR 8 (GLOVE) ×1
GLOVE SURG SYN 7.5  E (GLOVE) ×2
GLOVE SURG SYN 7.5 E (GLOVE) ×1 IMPLANT
GLOVE SURG SYN 7.5 PF PI (GLOVE) IMPLANT
GLOVE SURG SYN 8.0 (GLOVE) ×2 IMPLANT
GLOVE SURG SYN 8.0 PF PI (GLOVE) IMPLANT
GOWN STRL REUS W/ TWL LRG LVL3 (GOWN DISPOSABLE) ×2 IMPLANT
GOWN STRL REUS W/ TWL XL LVL3 (GOWN DISPOSABLE) IMPLANT
GOWN STRL REUS W/TWL LRG LVL3 (GOWN DISPOSABLE) ×4
GOWN STRL REUS W/TWL XL LVL3 (GOWN DISPOSABLE) ×2
KIT MARKER MARGIN INK (KITS) ×2 IMPLANT
NDL HYPO 25X1 1.5 SAFETY (NEEDLE) ×1 IMPLANT
NEEDLE HYPO 25X1 1.5 SAFETY (NEEDLE) ×2 IMPLANT
NS IRRIG 1000ML POUR BTL (IV SOLUTION) ×2 IMPLANT
PACK BASIN DAY SURGERY FS (CUSTOM PROCEDURE TRAY) ×2 IMPLANT
PENCIL SMOKE EVACUATOR (MISCELLANEOUS) ×2 IMPLANT
SLEEVE SCD COMPRESS KNEE MED (STOCKING) ×2 IMPLANT
SPONGE T-LAP 4X18 ~~LOC~~+RFID (SPONGE) ×2 IMPLANT
STRIP CLOSURE SKIN 1/2X4 (GAUZE/BANDAGES/DRESSINGS) ×2 IMPLANT
SUT MON AB 4-0 PC3 18 (SUTURE) ×2 IMPLANT
SUT VIC AB 3-0 SH 27 (SUTURE) ×2
SUT VIC AB 3-0 SH 27X BRD (SUTURE) ×1 IMPLANT
SYR CONTROL 10ML LL (SYRINGE) ×2 IMPLANT
TOWEL GREEN STERILE FF (TOWEL DISPOSABLE) ×2 IMPLANT
TRAY FAXITRON CT DISP (TRAY / TRAY PROCEDURE) ×2 IMPLANT
TUBE CONNECTING 20X1/4 (TUBING) ×1 IMPLANT
YANKAUER SUCT BULB TIP NO VENT (SUCTIONS) ×1 IMPLANT

## 2022-06-27 NOTE — Anesthesia Preprocedure Evaluation (Addendum)
Anesthesia Evaluation  Patient identified by MRN, date of birth, ID band Patient awake    Reviewed: Allergy & Precautions, NPO status , Patient's Chart, lab work & pertinent test results  Airway Mallampati: III  TM Distance: >3 FB Neck ROM: Full   Comment: Webbed neck Dental no notable dental hx.    Pulmonary sleep apnea and Continuous Positive Airway Pressure Ventilation , former smoker,    Pulmonary exam normal breath sounds clear to auscultation       Cardiovascular hypertension, negative cardio ROS Normal cardiovascular exam Rhythm:Regular Rate:Normal     Neuro/Psych PSYCHIATRIC DISORDERS Anxiety Depression negative neurological ROS     GI/Hepatic Neg liver ROS, GERD  ,  Endo/Other  Morbid obesity  Renal/GU negative Renal ROS  negative genitourinary   Musculoskeletal  (+) Arthritis ,   Abdominal   Peds negative pediatric ROS (+)  Hematology negative hematology ROS (+)   Anesthesia Other Findings Breast cancer  Reproductive/Obstetrics negative OB ROS                            Anesthesia Physical Anesthesia Plan  ASA: 3  Anesthesia Plan: General   Post-op Pain Management: Tylenol PO (pre-op)*   Induction: Intravenous  PONV Risk Score and Plan: 3 and Treatment may vary due to age or medical condition, Ondansetron and Dexamethasone  Airway Management Planned: LMA  Additional Equipment: None  Intra-op Plan:   Post-operative Plan: Extubation in OR  Informed Consent: I have reviewed the patients History and Physical, chart, labs and discussed the procedure including the risks, benefits and alternatives for the proposed anesthesia with the patient or authorized representative who has indicated his/her understanding and acceptance.     Dental advisory given  Plan Discussed with: CRNA, Anesthesiologist and Surgeon  Anesthesia Plan Comments:         Anesthesia Quick  Evaluation

## 2022-06-27 NOTE — Anesthesia Procedure Notes (Signed)
Procedure Name: LMA Insertion Date/Time: 06/27/2022 2:57 PM  Performed by: Tawni Millers, CRNAPre-anesthesia Checklist: Patient identified, Emergency Drugs available, Suction available and Patient being monitored Patient Re-evaluated:Patient Re-evaluated prior to induction Oxygen Delivery Method: Circle system utilized Preoxygenation: Pre-oxygenation with 100% oxygen Induction Type: IV induction Ventilation: Mask ventilation without difficulty LMA: LMA inserted LMA Size: 4.0 Number of attempts: 1 Airway Equipment and Method: Bite block Placement Confirmation: positive ETCO2 Tube secured with: Tape Dental Injury: Teeth and Oropharynx as per pre-operative assessment

## 2022-06-27 NOTE — Discharge Instructions (Addendum)
[facility] Office Phone Number [phone] Tylenol given at 1:23 p.m.  BREAST BIOPSY/ PARTIAL MASTECTOMY: POST OP INSTRUCTIONS  Always review your discharge instruction sheet given to you by the facility where your surgery was performed.  IF YOU HAVE DISABILITY OR FAMILY LEAVE FORMS, YOU MUST BRING THEM TO THE OFFICE FOR PROCESSING.  DO NOT GIVE THEM TO YOUR DOCTOR.  A prescription for pain medication may be given to you upon discharge.  Take your pain medication as prescribed, if needed.  If narcotic pain medicine is not needed, then you may take acetaminophen (Tylenol) or ibuprofen (Advil) as needed. Take your usually prescribed medications unless otherwise directed If you need a refill on your pain medication, please contact your pharmacy.  They will contact our office to request authorization.  Prescriptions will not be filled after 5pm or on week-ends. You should eat very light the first 24 hours after surgery, such as soup, crackers, pudding, etc.  Resume your normal diet the day after surgery. Most patients will experience some swelling and bruising in the breast.  Ice packs and a good support bra will help.  Swelling and bruising can take several days to resolve.  It is common to experience some constipation if taking pain medication after surgery.  Increasing fluid intake and taking a stool softener will usually help or prevent this problem from occurring.  A mild laxative (Milk of Magnesia or Miralax) should be taken according to package directions if there are no bowel movements after 48 hours. Unless discharge instructions indicate otherwise, you may remove your bandages 24-48 hours after surgery, and you may shower at that time.  You may have steri-strips (small skin tapes) in place directly over the incision.  These strips should be left on the skin for 7-10 days.  If your surgeon used skin glue on the incision, you may shower in 24 hours.  The glue will flake off over  the next 2-3 weeks.  Any sutures or staples will be removed at the office during your follow-up visit. ACTIVITIES:  You may resume regular daily activities (gradually increasing) beginning the next day.  Wearing a good support bra or sports bra minimizes pain and swelling.  You may have sexual intercourse when it is comfortable. You may drive when you no longer are taking prescription pain medication, you can comfortably wear a seatbelt, and you can safely maneuver your car and apply brakes. RETURN TO WORK:  ______________________________________________________________________________________ Jackie Montoya should see your doctor in the office for a follow-up appointment approximately two weeks after your surgery.  Your doctor's nurse will typically make your follow-up appointment when she calls you with your pathology report.  Expect your pathology report 2-3 business days after your surgery.  You may call to check if you do not hear from [place] after three days. OTHER INSTRUCTIONS: _______________________________________________________________________________________________ _____________________________________________________________________________________________________________________________________ _____________________________________________________________________________________________________________________________________ _____________________________________________________________________________________________________________________________________  WHEN TO CALL YOUR DOCTOR: Fever over 101.0 Nausea and/or vomiting. Extreme swelling or bruising. Continued bleeding from incision. Increased pain, redness, or drainage from the incision.  The clinic staff is available to answer your questions during regular business hours.  Please don't hesitate to call and ask to speak to one of the nurses for clinical concerns.  If you have a medical emergency, go to the nearest emergency room or call 911.  A  surgeon from [facility] is always on call at the hospital.  For further questions, please visit [email or web address]   Post Anesthesia Home Care Instructions  Activity: Get plenty of rest  for the remainder of the day. A responsible individual must stay with you for 24 hours following the procedure.  For the next 24 hours, DO NOT: -Drive a car -Paediatric nurse -Drink alcoholic beverages -Take any medication unless instructed by your physician -Make any legal decisions or sign important papers.  Meals: Start with liquid foods such as gelatin or soup. Progress to regular foods as tolerated. Avoid greasy, spicy, heavy foods. If nausea and/or vomiting occur, drink only clear liquids until the nausea and/or vomiting subsides. Call your physician if vomiting continues.  Special Instructions/Symptoms: Your throat may feel dry or sore from the anesthesia or the breathing tube placed in your throat during surgery. If this causes discomfort, gargle with warm salt water. The discomfort should disappear within 24 hours.  If you had a scopolamine patch placed behind your ear for the management of post- operative nausea and/or vomiting:  1. The medication in the patch is effective for 72 hours, after which it should be removed.  Wrap patch in a tissue and discard in the trash. Wash hands thoroughly with soap and water. 2. You may remove the patch earlier than 72 hours if you experience unpleasant side effects which may include dry mouth, dizziness or visual disturbances. 3. Avoid touching the patch. Wash your hands with soap and water after contact with the patch.

## 2022-06-27 NOTE — Op Note (Signed)
Pre-op Diagnosis:  Left invasive ductal carcinoma Post-op Diagnosis: same Procedure:  Left breast radioactive seed localized lumpectomy Surgeon:  Keita [place] K. Resident:  Dr. Clerance Lav I was personally present during the key and critical portions of this procedure and immediately available throughout the entire procedure, as documented in my operative note. Anesthesia:  GEN - LMA Indications:  This is a 70 year old female with no family history of breast cancer who presents after her recent screening mammogram revealed a suspicious area in the left upper outer quadrant.  She underwent further work-up which revealed a 0.4 x 0.4 x 0.6 cm mass located at 11:00, 5 cm from the nipple.  Biopsy was performed that revealed invasive ductal carcinoma grade 1, ER/PR positive, HER2 negative, Ki-67 10%.  Axilla was negative on ultrasound.  Description of procedure: The patient is brought to the operating room placed in supine position on the operating room table. After an adequate level of general anesthesia was obtained, her left breast was prepped with ChloraPrep and draped in sterile fashion. A timeout was taken to ensure the proper patient and proper procedure. We interrogated the breast with the neoprobe. We made a transverse incision in the upper breast after infiltrating with 0.25% Marcaine. Dissection was carried down in the breast tissue with cautery. We used the neoprobe to guide [place] towards the radioactive seed. We excised an area of tissue around the radioactive seed 1.5 cm in diameter. The specimen was removed and was oriented with a paint kit. Specimen mammogram showed the radioactive seed as well as the biopsy clip within the specimen. This was sent for pathologic examination. There is no residual radioactivity within the biopsy cavity. We inspected carefully for hemostasis. The wound was thoroughly irrigated. The wound was closed with a deep layer of 3-0 Vicryl and a subcuticular layer of 4-0  Monocryl. Benzoin Steri-Strips were applied. The patient was then extubated and brought to the recovery room in stable condition. All sponge, instrument, and needle counts are correct.  Imogene Burn. [name] Dover, MD, [facility]  General/ Trauma Surgery  06/27/2022 3:19 PM

## 2022-06-27 NOTE — Transfer of Care (Signed)
Immediate Anesthesia Transfer of Care Note  Patient: MACAYLA EKDAHL  Procedure(s) Performed: LEFT BREAST RADIOACTIVE SEED LOCALIZED LUMPECTOMY (Left: Breast)  Patient Location: PACU  Anesthesia Type:General  Level of Consciousness: awake  Airway & Oxygen Therapy: Patient Spontanous Breathing and Patient connected to face mask oxygen  Post-op Assessment: Report given to RN and Post -op Vital signs reviewed and stable  Post vital signs: Reviewed and stable  Last Vitals:  Vitals Value Taken Time  BP    Temp    Pulse 86 06/27/22 1541  Resp 17 06/27/22 1541  SpO2 98 % 06/27/22 1541  Vitals shown include unvalidated device data.  Last Pain:  Vitals:   06/27/22 1311  TempSrc: Oral  PainSc: 0-No pain         Complications: No notable events documented.

## 2022-06-27 NOTE — Interval H&P Note (Signed)
History and Physical Interval Note:  06/27/2022 1:12 PM  Jackie Montoya  has presented today for surgery, with the diagnosis of LEFT BREAST INVASIVE DUCTAL CARCINOMA.  The various methods of treatment have been discussed with the patient and family. After consideration of risks, benefits and other options for treatment, the patient has consented to  Procedure(s) with comments: LEFT BREAST RADIOACTIVE SEED LOCALIZED LUMPECTOMY (Left) - LMA as a surgical intervention.  The patient's history has been reviewed, patient examined, no change in status, stable for surgery.  I have reviewed the patient's chart and labs.  Questions were answered to the patient's satisfaction.     Maia Petties

## 2022-06-27 NOTE — Anesthesia Postprocedure Evaluation (Signed)
Anesthesia Post Note  Patient: Jackie Montoya  Procedure(s) Performed: LEFT BREAST RADIOACTIVE SEED LOCALIZED LUMPECTOMY (Left: Breast)     Patient location during evaluation: PACU Anesthesia Type: General Level of consciousness: awake and alert, oriented and patient cooperative Pain management: pain level controlled Vital Signs Assessment: post-procedure vital signs reviewed and stable Respiratory status: spontaneous breathing, nonlabored ventilation and respiratory function stable Cardiovascular status: blood pressure returned to baseline and stable Postop Assessment: no apparent nausea or vomiting Anesthetic complications: no   No notable events documented.  Last Vitals:  Vitals:   06/27/22 1541 06/27/22 1545  BP: 130/61 (!) 148/62  Pulse: 86 88  Resp: 17 15  Temp: 36.9 C   SpO2: 98% 99%    Last Pain:  Vitals:   06/27/22 1541  TempSrc:   PainSc: [place]

## 2022-06-28 ENCOUNTER — Encounter (HOSPITAL_BASED_OUTPATIENT_CLINIC_OR_DEPARTMENT_OTHER): Payer: Self-pay | Admitting: Surgery

## 2022-07-01 ENCOUNTER — Encounter: Payer: Self-pay | Admitting: *Deleted

## 2022-07-01 LAB — SURGICAL PATHOLOGY

## 2022-07-02 ENCOUNTER — Encounter: Payer: Self-pay | Admitting: *Deleted

## 2022-07-02 ENCOUNTER — Encounter (HOSPITAL_COMMUNITY): Payer: Self-pay

## 2022-07-02 DIAGNOSIS — C50212 Malignant neoplasm of upper-inner quadrant of left female breast: Secondary | ICD-10-CM

## 2022-07-03 ENCOUNTER — Telehealth: Payer: Self-pay | Admitting: Radiation Oncology

## 2022-07-03 NOTE — Telephone Encounter (Signed)
7/19 @ 9:28 am Left voicemail for patient to call our office to be schedule for a consult with Dr. Isidore Moos.

## 2022-07-09 ENCOUNTER — Encounter: Payer: Self-pay | Admitting: *Deleted

## 2022-07-09 NOTE — Progress Notes (Signed)
Patient Care Team: Kelton Pillar, MD as PCP - General (Family Medicine) Mauro Kaufmann, RN as Oncology Nurse Navigator Rockwell Germany, RN as Oncology Nurse Navigator Donnie Mesa, MD as Consulting Physician (General Surgery) Nicholas Lose, MD as Consulting Physician (Hematology and Oncology) Eppie Gibson, MD as Attending Physician (Radiation Oncology)  DIAGNOSIS: No diagnosis found.  SUMMARY OF ONCOLOGIC HISTORY: Oncology History  Malignant neoplasm of upper-inner quadrant of left breast in female, estrogen receptor positive ([place])  06/03/2022 Initial Diagnosis   Malignant neoplasm of upper-inner quadrant of left breast in female, estrogen receptor positive ([place])   06/05/2022 Cancer Staging   Staging form: Breast, AJCC 8th Edition - Clinical: Stage IA (cT1b, cN0, cM0, G1, ER+, PR+, HER2-) - Signed by Nicholas Lose, MD on 06/05/2022 Stage prefix: Initial diagnosis Histologic grading system: 3 grade system   06/12/2022 Genetic Testing   Negative hereditary cancer genetic testing: no pathogenic variants detected in [facility] BRCAPlus Panel.  Report date is June 12, 2022.   The BRCAplus panel offered by [employer] and includes sequencing and deletion/duplication analysis for the following 8 genes: ATM, BRCA1, BRCA2, CDH1, CHEK2, PALB2, PTEN, and TP53.  Pan-cancer panel is pending.      CHIEF COMPLIANT: Follow-up after surgery  INTERVAL HISTORY: Jackie Montoya is a 71 y.o. female is here because of recent diagnosis of left breast cancer. She presents to the clinic today for a follow-up to discuss pathology report.   ALLERGIES:  is allergic to codeine.  MEDICATIONS:  Current Outpatient Medications  Medication Sig Dispense Refill   amLODipine (NORVASC) 5 MG tablet Take 5 mg by mouth daily.     atorvastatin (LIPITOR) 20 MG tablet Take 20 mg by mouth daily.     buPROPion (WELLBUTRIN XL) 300 MG 24 hr tablet Take 300 mg by mouth daily.     CALCIUM PO Take by mouth daily.       Cholecalciferol (VITAMIN D) 125 MCG (5000 UT) CAPS Take 1 capsule by mouth daily. 30 capsule 0   FLUoxetine (PROZAC) 40 MG capsule Take 40 mg by mouth daily.     fluticasone (FLONASE) 50 MCG/ACT nasal spray Place into both nostrils daily.     hydrochlorothiazide (HYDRODIURIL) 25 MG tablet Take 25 mg by mouth daily.     loratadine (CLARITIN) 10 MG tablet Take 10 mg by mouth daily.     losartan (COZAAR) 25 MG tablet Take 25 mg by mouth daily.     omeprazole (PRILOSEC) 20 MG capsule Take 20 mg by mouth daily.     Semaglutide, 2 MG/DOSE, 8 MG/3ML SOPN Inject 2 mg as directed once a week. 9 mL 0   Turmeric (QC TUMERIC COMPLEX) 500 MG CAPS Take 1 capsule by mouth daily.     No current facility-administered medications for this visit.    PHYSICAL EXAMINATION: ECOG PERFORMANCE STATUS: {CHL ONC ECOG PS:[id number]}  There were no vitals filed for this visit. There were no vitals filed for this visit.  BREAST:*** No palpable masses or nodules in either right or left breasts. No palpable axillary supraclavicular or infraclavicular adenopathy no breast tenderness or nipple discharge. (exam performed in the presence of a chaperone)  LABORATORY DATA:  I have reviewed the data as listed    Latest Ref Rng & Units 06/05/2022   12:14 PM 01/22/2022   10:54 AM 07/11/2021   12:42 PM  CMP  Glucose 70 - 99 mg/dL 95  90  96   BUN 8 - 23 mg/dL 19  14  10   Creatinine 0.44 - 1.00 mg/dL 0.76  0.79  0.74   Sodium 135 - 145 mmol/L 137  139  138   Potassium 3.5 - 5.1 mmol/L 3.9  3.9  4.1   Chloride 98 - 111 mmol/L 102  97  100   CO2 22 - 32 mmol/L '29  24  26   ' Calcium 8.9 - 10.3 mg/dL 9.6  9.7  9.7   Total Protein 6.5 - 8.1 g/dL 7.1  6.7  6.9   Total Bilirubin 0.3 - 1.2 mg/dL 0.6  0.8  0.5   Alkaline Phos 38 - 126 U/L 80  104  115   AST 15 - 41 U/L '17  20  15   ' ALT 0 - 44 U/L '15  13  14     ' Lab Results  Component Value Date   WBC 7.5 06/05/2022   HGB 14.1 06/05/2022   HCT 41.4 06/05/2022   MCV 88.5  06/05/2022   PLT 248 06/05/2022   NEUTROABS 4.7 06/05/2022    ASSESSMENT & PLAN:  No problem-specific Assessment & Plan notes found for this encounter.    No orders of the defined types were placed in this encounter.  The patient has a good understanding of the overall plan. she agrees with it. she will call with any problems that may develop before the next visit here. Total time spent: 30 mins including face to face time and time spent for planning, charting and co-ordination of care   Suzzette Righter, [place] 07/09/22    I Gardiner Coins am scribing for Dr. Lindi Adie  ***

## 2022-07-10 ENCOUNTER — Inpatient Hospital Stay: Payer: Medicare Other | Attending: Hematology and Oncology | Admitting: Hematology and Oncology

## 2022-07-10 ENCOUNTER — Other Ambulatory Visit: Payer: Self-pay

## 2022-07-10 DIAGNOSIS — Z79899 Other long term (current) drug therapy: Secondary | ICD-10-CM | POA: Diagnosis not present

## 2022-07-10 DIAGNOSIS — Z79811 Long term (current) use of aromatase inhibitors: Secondary | ICD-10-CM | POA: Insufficient documentation

## 2022-07-10 DIAGNOSIS — C50212 Malignant neoplasm of upper-inner quadrant of left female breast: Secondary | ICD-10-CM | POA: Insufficient documentation

## 2022-07-10 DIAGNOSIS — Z170 Estrogen receptor positive status [ER+]: Secondary | ICD-10-CM | POA: Diagnosis not present

## 2022-07-10 NOTE — Assessment & Plan Note (Addendum)
05/28/2022 screening mammogram detected left breast asymmetry at 11 o'clock position measuring 0.6 cm.  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 +/- XRT 3.  Adjuvant antiestrogen therapy  Based on favorable characteristics and negative margins, radiation is optional. We will start the patient on antiestrogen therapy with letrozole 2.5 mg daily x5 years  Letrozole counseling: We discussed the risks and benefits of anti-estrogen therapy with aromatase inhibitors. These include but not limited to insomnia, hot flashes, mood changes, vaginal dryness, bone density loss, and weight gain. We strongly believe that the benefits far outweigh the risks. Patient understands these risks and consented to starting treatment. Planned treatment duration is 5 years.  Return to clinic in 3 months for survivorship care plan was

## 2022-07-11 ENCOUNTER — Telehealth: Payer: Self-pay | Admitting: Hematology and Oncology

## 2022-07-11 ENCOUNTER — Ambulatory Visit (INDEPENDENT_AMBULATORY_CARE_PROVIDER_SITE_OTHER): Payer: Medicare Other | Admitting: Adult Health

## 2022-07-11 ENCOUNTER — Encounter (INDEPENDENT_AMBULATORY_CARE_PROVIDER_SITE_OTHER): Payer: Self-pay | Admitting: Adult Health

## 2022-07-11 VITALS — BP 98/66 | HR 82 | Temp 98.1°F | Ht 65.0 in | Wt 235.0 lb

## 2022-07-11 DIAGNOSIS — R7301 Impaired fasting glucose: Secondary | ICD-10-CM

## 2022-07-11 DIAGNOSIS — E669 Obesity, unspecified: Secondary | ICD-10-CM

## 2022-07-11 DIAGNOSIS — E559 Vitamin D deficiency, unspecified: Secondary | ICD-10-CM

## 2022-07-11 DIAGNOSIS — Z6839 Body mass index (BMI) 39.0-39.9, adult: Secondary | ICD-10-CM

## 2022-07-11 DIAGNOSIS — I10 Essential (primary) hypertension: Secondary | ICD-10-CM

## 2022-07-11 MED ORDER — SEMAGLUTIDE (2 MG/DOSE) 8 MG/3ML ~~LOC~~ SOPN: 2.0000 mg | PEN_INJECTOR | SUBCUTANEOUS | 0 refills | Status: DC

## 2022-07-11 NOTE — Telephone Encounter (Signed)
Scheduled appointment per 7/26 los. Left message.

## 2022-07-12 LAB — HEMOGLOBIN A1C
Est. average glucose Bld gHb Est-mCnc: 100 mg/dL
Hgb A1c MFr Bld: 5.1 % (ref 4.8–5.6)

## 2022-07-12 LAB — VITAMIN D 25 HYDROXY (VIT D DEFICIENCY, FRACTURES): Vit D, 25-Hydroxy: 62.1 ng/mL (ref 30.0–100.0)

## 2022-07-12 LAB — INSULIN, RANDOM: INSULIN: 10.8 u[IU]/mL (ref 2.6–24.9)

## 2022-07-14 NOTE — Progress Notes (Signed)
HPI:   Jackie Montoya was previously seen in the [place] clinic due to a personal and family  history of breast cancer and concerns regarding a hereditary predisposition to cancer. Please refer to our prior cancer genetics clinic note for more information regarding our discussion, assessment and recommendations, at the time. Jackie Montoya recent genetic test results were disclosed to her, as were recommendations warranted by these results. These results and recommendations are discussed in more detail below.  CANCER HISTORY:  Oncology History  Malignant neoplasm of upper-inner quadrant of left breast in female, estrogen receptor positive ([place])  06/03/2022 Initial Diagnosis   Malignant neoplasm of upper-inner quadrant of left breast in female, estrogen receptor positive ([place])   06/05/2022 Cancer Staging   Staging form: Breast, AJCC 8th Edition - Clinical: Stage IA (cT1b, cN0, cM0, G1, ER+, PR+, HER2-) - Signed by Nicholas Lose, MD on 06/05/2022 Stage prefix: Initial diagnosis Histologic grading system: 3 grade system   06/12/2022 Genetic Testing   Negative hereditary cancer genetic testing: no pathogenic variants detected in [facility] BRCAPlus Panel.  Report date is June 12, 2022.   The BRCAplus panel offered by [employer] and includes sequencing and deletion/duplication analysis for the following 8 genes: ATM, BRCA1, BRCA2, CDH1, CHEK2, PALB2, PTEN, and TP53.  Pan-cancer panel is pending.      FAMILY HISTORY:  We obtained a detailed, 4-generation family history.  Significant diagnoses are listed below:      Family History  Problem Relation Age of Onset   Lung cancer Sister 62   Colon cancer Sister 66        colectomy   Lung cancer Paternal Uncle          smoking hx   Colon cancer Paternal Grandmother          ?   Lung cancer Cousin          smoking hx; maternal female cousin   Breast cancer Cousin          dx before 63; paternal female cousin       Jackie Montoya is  unaware of previous family history of genetic testing for hereditary cancer risks. There is no reported Ashkenazi Jewish ancestry. There is no known consanguinity.  GENETIC TEST RESULTS:  The [facility] CustomNext-Cancer +RNAinsight Panel found no pathogenic mutations.   The CustomNext-Cancer+RNAinsight panel offered by [employer] includes sequencing and rearrangement analysis for the following 47 genes:  APC, ATM, AXIN2, BARD1, BMPR1A, BRCA1, BRCA2, BRIP1, CDH1, CDK4, CDKN2A, CHEK2, DICER1, EPCAM, GREM1, HOXB13, MEN1, MLH1, MSH2, MSH3, MSH6, MUTYH, NBN, NF1, NF2, NTHL1, PALB2, PMS2, POLD1, POLE, PTEN, RAD51C, RAD51D, RECQL, RET, SDHA, SDHAF2, SDHB, SDHC, SDHD, SMAD4, SMARCA4, STK11, TP53, TSC1, TSC2, and VHL.  RNA data is routinely analyzed for use in variant interpretation for all genes.  The test report has been scanned into EPIC and is located under the Molecular Pathology section of the Results Review tab.  A portion of the result report is included below for reference. Genetic testing reported out on June 13, 2022.      Even though a pathogenic variant was not identified, possible explanations for the cancer in the family may include: There may be no hereditary risk for cancer in the family. The cancers in Jackie Montoya and/or her family may be sporadic/familial or due to other genetic and environmental factors. There may be a gene mutation in one of these genes that current testing methods cannot detect but that chance is small. There  could be another gene that has not yet been discovered, or that we have not yet tested, that is responsible for the cancer diagnoses in the family.  It is also possible there is a hereditary cause for the cancer in the family that Jackie Montoya did not inherit.     ADDITIONAL GENETIC TESTING:  We discussed with Jackie Montoya that her genetic testing was fairly extensive.  If there are additional relevant genes identified to increase cancer risk that can be analyzed  in the future, we would be happy to discuss and coordinate this testing at that time.     CANCER SCREENING RECOMMENDATIONS:  Jackie Montoya test result is considered negative (normal).  This means that we have not identified a hereditary cause for her personal history of breast cancer at this time.    An individual's cancer risk and medical management are not determined by genetic test results alone. Overall cancer risk assessment incorporates additional factors, including personal medical history, family history, and any available genetic information that may result in a personalized plan for cancer prevention and surveillance. Therefore, it is recommended she continue to follow the cancer management and screening guidelines provided by her oncology and primary healthcare provider.  Based on the reported family history, specific cancer screenings for Jackie Montoya include:  Colonoscopies at least every 5 years, or as recommended by her gastroenterologist, given the history of colon cancer in her sister  [name]:   Since she did not inherit a identifiable mutation in a cancer predisposition gene included on this panel, her children could not have inherited a known mutation from her in one of these genes. Individuals in this family might be at some increased risk of developing cancer, over the general population risk, due to the family history of cancer.  Individuals in the family should notify their providers of the family history of cancer. We recommend women in this family have a yearly mammogram beginning at age 62, or 69 years younger than the earliest onset of cancer, an annual clinical breast exam, and perform monthly breast self-exams.   First degree relatives of those with colon cancer should receive colonoscopies beginning at age 11, or 10 years prior to the earliest diagnosis of colon cancer in the family, and receive colonoscopies at least every 5 years, or as  recommended by their gastroenterologist.     FOLLOW-UP:  Lastly, we discussed with Jackie Montoya that cancer genetics is a rapidly advancing field and it is possible that new genetic tests will be appropriate for her and/or her family members in the future. We encouraged her to remain in contact with cancer genetics on an annual basis so we can update her personal and family histories and let her know of advances in cancer genetics that may benefit this family.   Our contact number was provided. Jackie Montoya questions were answered to her satisfaction, and she knows she is welcome to call us at anytime with additional questions or concerns.   Jackie Montoya M. Joette Catching, [place], [facility] Genetic Counselor [email or web address] (P) [phone]

## 2022-07-16 ENCOUNTER — Encounter (INDEPENDENT_AMBULATORY_CARE_PROVIDER_SITE_OTHER): Payer: Self-pay | Admitting: Adult Health

## 2022-07-16 DIAGNOSIS — D2222 Melanocytic nevi of left ear and external auricular canal: Secondary | ICD-10-CM | POA: Diagnosis not present

## 2022-07-16 DIAGNOSIS — Z170 Estrogen receptor positive status [ER+]: Secondary | ICD-10-CM | POA: Diagnosis not present

## 2022-07-16 DIAGNOSIS — D1801 Hemangioma of skin and subcutaneous tissue: Secondary | ICD-10-CM | POA: Diagnosis not present

## 2022-07-16 DIAGNOSIS — D2271 Melanocytic nevi of right lower limb, including hip: Secondary | ICD-10-CM | POA: Diagnosis not present

## 2022-07-16 DIAGNOSIS — L723 Sebaceous cyst: Secondary | ICD-10-CM | POA: Diagnosis not present

## 2022-07-16 DIAGNOSIS — D2262 Melanocytic nevi of left upper limb, including shoulder: Secondary | ICD-10-CM | POA: Diagnosis not present

## 2022-07-16 DIAGNOSIS — L821 Other seborrheic keratosis: Secondary | ICD-10-CM | POA: Diagnosis not present

## 2022-07-16 DIAGNOSIS — C50212 Malignant neoplasm of upper-inner quadrant of left female breast: Secondary | ICD-10-CM | POA: Diagnosis not present

## 2022-07-16 NOTE — Progress Notes (Signed)
Location of Breast Cancer:  Malignant neoplasm of upper-inner quadrant of left breast in female, estrogen receptor positive  Histology per Pathology Report:  06/27/2022 FINAL MICROSCOPIC DIAGNOSIS:  A. BREAST, LEFT, LUMPECTOMY:  - Invasive ductal carcinoma, 0.6 cm, grade 1  - Ductal carcinoma in situ, intermediate grade  - Resection margins are negative for carcinoma  - Biopsy site changes   Receptor Status: ER(100%), PR (95%), Her2-neu (Negative via FISH), Ki-67(10%)  Did patient present with symptoms (if so, please note symptoms) or was this found on screening mammography?: Bilateral screening mammogram on 05/06/22 showed left breast abnormality   Past/Anticipated interventions by surgeon, if any:  06/27/2022 --Dr. Donnie Mesa Left breast radioactive seed localized lumpectomy  Past/Anticipated interventions by medical oncology, if any:  Under care of Dr. Nicholas Lose 07/10/2022 --Treatment plan: Left lumpectomy 06/27/2022: Grade 1 IDC 0.6 cm with intermediate grade DCIS, margins negative +/- XRT Adjuvant antiestrogen therapy  She will be seeing radiation oncology in the coming week. If she does not do radiation then we will start her on antiestrogen therapy with letrozole 2.5 mg daily x5 years.   We will await the consultation with radiation oncology. --Return to clinic in 3 months for survivorship care plan visit  Lymphedema issues, if any:  Patient denies    Pain issues, if any:  Patient denies   SAFETY ISSUES: Prior radiation? No Pacemaker/ICD? No Possible current pregnancy? No--postmenopausal Is the patient on methotrexate? No  Current Complaints / other details:  Nothing else of note

## 2022-07-16 NOTE — Progress Notes (Signed)
Radiation Oncology         [phone] ________________________________  Name: Jackie Montoya MRN: [id number]  Date: 07/17/2022  DOB: 26-Jun-1951  Follow-Up Visit Note  Outpatient  CC: Kelton Pillar, MD  Nicholas Lose, MD  Diagnosis:   No diagnosis found.   S/p lumpectomy: Left Breast UIQ, Invasive ductal carcinoma with intermediate grade DCIS, ER+ / PR+ / Her2-, Grade 1   Cancer Staging  Malignant neoplasm of upper-inner quadrant of left breast in female, estrogen receptor positive ([place]) Staging form: Breast, AJCC 8th Edition - Clinical: Stage IA (cT1b, cN0, cM0, G1, ER+, PR+, HER2-) - Signed by Nicholas Lose, MD on 06/05/2022   CHIEF COMPLAINT: Here to discuss management of left breast cancer  Narrative:  The patient returns today for follow-up.     Since breast clinic consultation date of 06/05/22, she underwent genetic testing on that same date which revealed no clinically significant variants by BRCAplus testing.   The patient opted to proceed with a left breast lumpectomy without nodal biopsies on 06/27/22 under the care of Dr. [name] Dover. Pathology from the procedure revealed: tumor size of 0.6 cm; histology of grade 1 invasive ductal carcinoma with intermediate grade DCIS; all margins negative for both invasive and in-situ disease; margin status to invasive disease of 8 mm from the superior margin; margin status to in situ disease of 2 mm from the superior margin; no lymph nodes were examined;  ER status: 100% positive and PR status 95% positive, both with strong staining intensity; Proliferation marker Ki67 at 10%; Her2 status negative.  The patient has met with Dr. Lindi Adie and will proceed with antiestrogen therapy (letrozole) following XRT.   Symptomatically, the patient reports: ***        ALLERGIES:  is allergic to codeine.  Meds: Current Outpatient Medications  Medication Sig Dispense Refill   amLODipine (NORVASC) 5 MG tablet Take 5 mg by mouth daily.     atorvastatin  (LIPITOR) 20 MG tablet Take 20 mg by mouth daily.     buPROPion (WELLBUTRIN XL) 300 MG 24 hr tablet Take 300 mg by mouth daily.     CALCIUM PO Take by mouth daily.      Cholecalciferol (VITAMIN D) 125 MCG (5000 UT) CAPS Take 1 capsule by mouth daily. 30 capsule 0   FLUoxetine (PROZAC) 40 MG capsule Take 40 mg by mouth daily.     fluticasone (FLONASE) 50 MCG/ACT nasal spray Place into both nostrils daily.     hydrochlorothiazide (HYDRODIURIL) 25 MG tablet Take 25 mg by mouth daily.     loratadine (CLARITIN) 10 MG tablet Take 10 mg by mouth daily.     losartan (COZAAR) 25 MG tablet Take 25 mg by mouth daily.     omeprazole (PRILOSEC) 20 MG capsule Take 20 mg by mouth daily.     Semaglutide, 2 MG/DOSE, 8 MG/3ML SOPN Inject 2 mg as directed once a week. 9 mL 0   Turmeric (QC TUMERIC COMPLEX) 500 MG CAPS Take 1 capsule by mouth daily.     No current facility-administered medications for this encounter.    Physical Findings:  vitals were not taken for this visit. .     General: Alert and oriented, in no acute distress HEENT: Head is normocephalic. Extraocular movements are intact. Oropharynx is clear. Neck: Neck is supple, no palpable cervical or supraclavicular lymphadenopathy. Heart: Regular in rate and rhythm with no murmurs, rubs, or gallops. Chest: Clear to auscultation bilaterally, with no rhonchi, wheezes, or rales. Abdomen:  Soft, nontender, nondistended, with no rigidity or guarding. Extremities: No cyanosis or edema. Lymphatics: see Neck Exam Musculoskeletal: symmetric strength and muscle tone throughout. Neurologic: No obvious focalities. Speech is fluent.  Psychiatric: Judgment and insight are intact. Affect is appropriate. Breast exam reveals ***  Lab Findings: Lab Results  Component Value Date   WBC 7.5 06/05/2022   HGB 14.1 06/05/2022   HCT 41.4 06/05/2022   MCV 88.5 06/05/2022   PLT 248 06/05/2022    _0 @  Radiographic Findings: MM Breast Surgical  Specimen  Result Date: 06/27/2022 CLINICAL DATA:  Evaluate specimen EXAM: SPECIMEN RADIOGRAPH OF THE LEFT BREAST COMPARISON:  Previous exam(s). FINDINGS: Status post excision of the left breast. The radioactive seed and biopsy marker clip are present, completely intact, and were marked for pathology. IMPRESSION: Specimen radiograph of the left breast. Electronically Signed   By: Dorise Bullion III M.D.   On: 06/27/2022 15:18  MM LT RADIOACTIVE SEED LOC MAMMO GUIDE  Result Date: 06/26/2022 CLINICAL DATA:  Pre lumpectomy localization of recently biopsied invasive ductal carcinoma and DCIS in the 12 o'clock position of the left breast, marked with a coil shaped biopsy marker clip. EXAM: MAMMOGRAPHIC GUIDED RADIOACTIVE SEED LOCALIZATION OF THE LEFT BREAST COMPARISON:  Previous exam(s). FINDINGS: Patient presents for radioactive seed localization prior to left lumpectomy. I met with the patient and we discussed the procedure of seed localization including benefits and alternatives. We discussed the high likelihood of a successful procedure. We discussed the risks of the procedure including infection, bleeding, tissue injury and further surgery. We discussed the low dose of radioactivity involved in the procedure. Informed, written consent was given. The usual time-out protocol was performed immediately prior to the procedure. Using mammographic guidance, sterile technique, 1% lidocaine and an I-125 radioactive seed, the previously placed coil shaped biopsy marker clip in the 12 o'clock position of the left breast was localized using a cephalad approach. The follow-up mammogram images confirm the seed in the expected location and were marked for Dr. [name] Dover. Follow-up survey of the patient confirms presence of the radioactive seed. Order number of I-125 seed:  [id number]. Total activity:  0.251 mCi reference Date: 05/23/2022 The patient tolerated the procedure well and was released from the [facility]. She was  given instructions regarding seed removal. IMPRESSION: Radioactive seed localization left breast. No apparent complications. Electronically Signed   By: Claudie Revering M.D.   On: 06/26/2022 15:19   Impression/Plan: We discussed adjuvant radiotherapy today.  I recommend *** in order to ***.  I reviewed the logistics, benefits, risks, and potential side effects of this treatment in detail. Risks may include but not necessary be limited to acute and late injury tissue in the radiation fields such as skin irritation (change in color/pigmentation, itching, dryness, pain, peeling). She may experience fatigue. We also discussed possible risk of long term cosmetic changes or scar tissue. There is also a smaller risk for lung toxicity, ***cardiac toxicity, ***brachial plexopathy, ***lymphedema, ***musculoskeletal changes, ***rib fragility or ***induction of a second malignancy, ***late chronic non-healing soft tissue wound.    The patient asked good questions which I answered to her satisfaction. She is enthusiastic about proceeding with treatment. A consent form has been *** signed and placed in her chart.  A total of *** medically necessary complex treatment devices will be fabricated and supervised by me: *** fields with MLCs for custom blocks to protect heart, and lungs;  and, a Vac-lok. MORE COMPLEX DEVICES MAY BE MADE IN DOSIMETRY FOR FIELD IN  FIELD BEAMS FOR DOSE HOMOGENEITY.  I have requested : 3D Simulation which is medically necessary to give adequate dose to at risk tissues while sparing lungs and heart.  I have requested a DVH of the following structures: lungs, heart, *** lumpectomy cavity.    The patient will receive *** Gy in *** fractions to the *** with *** fields.  This will be *** followed by a boost.  On date of service, in total, I spent *** minutes on this encounter. Patient was seen in person.  _____________________________________   Eppie Gibson, MD  This document serves as a record of  services personally performed by Eppie Gibson, MD. It was created on her behalf by Roney Mans, a trained medical scribe. The creation of this record is based on the scribe's personal observations and the provider's statements to them. This document has been checked and approved by the attending provider.

## 2022-07-17 ENCOUNTER — Ambulatory Visit
Admission: RE | Admit: 2022-07-17 | Discharge: 2022-07-17 | Disposition: A | Discharge status: Home / Self Care | Payer: Medicare Other | Source: Ambulatory Visit | Attending: Radiation Oncology | Admitting: Radiation Oncology

## 2022-07-17 ENCOUNTER — Encounter: Payer: Self-pay | Admitting: Radiation Oncology

## 2022-07-17 ENCOUNTER — Encounter (INDEPENDENT_AMBULATORY_CARE_PROVIDER_SITE_OTHER): Payer: Self-pay | Admitting: Adult Health

## 2022-07-17 ENCOUNTER — Other Ambulatory Visit: Payer: Self-pay

## 2022-07-17 VITALS — BP 137/65 | HR 81 | Temp 98.9°F | Resp 20 | Ht 65.0 in | Wt 241.5 lb

## 2022-07-17 DIAGNOSIS — Z170 Estrogen receptor positive status [ER+]: Secondary | ICD-10-CM | POA: Diagnosis not present

## 2022-07-17 DIAGNOSIS — F40240 Claustrophobia: Secondary | ICD-10-CM | POA: Diagnosis not present

## 2022-07-17 DIAGNOSIS — Z510 Encounter for antineoplastic radiation therapy: Secondary | ICD-10-CM | POA: Diagnosis not present

## 2022-07-17 DIAGNOSIS — Z79899 Other long term (current) drug therapy: Secondary | ICD-10-CM | POA: Insufficient documentation

## 2022-07-17 DIAGNOSIS — C50212 Malignant neoplasm of upper-inner quadrant of left female breast: Secondary | ICD-10-CM | POA: Insufficient documentation

## 2022-07-17 NOTE — Telephone Encounter (Signed)
Please advise 

## 2022-07-17 NOTE — Progress Notes (Unsigned)
Chief Complaint:   OBESITY Jackie Montoya is here to discuss her progress with her obesity treatment plan along with follow-up of her obesity related diagnoses. Jackie Montoya is on keeping a food journal and adhering to recommended goals of 1300 calories and 90-100 grams of protein daily and states she is following her eating plan approximately 50% of the time. Jackie Montoya states she is active while doing yard work (60 minutes), and Jackie Montoya for 10 minutes 5 times per week.  Today's visit was #: 28 Starting weight: 287 lbs Starting date: 12/22/2019 Today's weight: 235 lbs Today's date: 07/11/2022 Total lbs lost to date: 52 Total lbs lost since last in-office visit: 0  Interim History: Jackie Montoya has been on the max dose of Ozempic at 2 mg since 04/24/2022.  She endorses increased stress eating due to recent breast cancer diagnosis and treatment.  On 06/27/2022 successful lumpectomy.  She has an upcoming office visit with Dr. Isidore Moos, Radiologist for radiation treatment.  Subjective:   1. IFG (impaired fasting glucose) ***  2. Vitamin D deficiency Jackie Montoya is on OTC vitamin D3 5000 units once daily.  3. Essential hypertension Jackie Montoya is on amlodipine 5 mg once daily, losartan 25 mg once daily, and hydrochlorothiazide 25 mg once daily.  Her blood pressure is soft at her office visit today.  She denies symptoms of hypotension.  Assessment/Plan:   1. IFG (impaired fasting glucose) We will check labs today, and we will refill Ozempic 2 mg once weekly for 1 month.  - Semaglutide, 2 MG/DOSE, 8 MG/3ML SOPN; Inject 2 mg as directed once a week.  Dispense: 9 mL; Refill: 0 - Hemoglobin A1c - Insulin, random  2. Vitamin D deficiency We will check labs today, and we will follow-up at Tymira's next office visit.  - VITAMIN D 25 Hydroxy (Vit-D Deficiency, Fractures)  3. Essential hypertension We will check labs today.  Jackie Montoya is to check her blood pressure at home and bring in her readings to her next office visit.  4.  Obesity: Current BMI 39.2 [name] is currently in the action stage of change. As such, her goal is to continue with weight loss efforts. She has agreed to keeping a food journal and adhering to recommended goals of 1300 calories and 100 grams of protein daily.   Exercise goals: As is.   Behavioral modification strategies: {[id number]}.  Jackie Montoya has agreed to follow-up with our clinic in 4 weeks. She was informed of the importance of frequent follow-up visits to maximize her success with intensive lifestyle modifications for her multiple health conditions.   Jackie Montoya was informed we would discuss her lab results at her next visit unless there is a critical issue that needs to be addressed sooner. Jackie Montoya agreed to keep her next visit at the agreed upon time to discuss these results.  Objective:   Blood pressure 98/66, pulse 82, temperature 98.1 F (36.7 C), height '5\' 5"'$  (1.651 m), weight 235 lb (106.6 kg), last menstrual period 06/15/2002, SpO2 96 %. Body mass index is 39.11 kg/m.  General: Cooperative, alert, well developed, in no acute distress. HEENT: Conjunctivae and lids unremarkable. Cardiovascular: Regular rhythm.  Lungs: Normal work of breathing. Neurologic: No focal deficits.   Lab Results  Component Value Date   CREATININE 0.76 06/05/2022   BUN 19 06/05/2022   NA 137 06/05/2022   K 3.9 06/05/2022   CL 102 06/05/2022   CO2 29 06/05/2022   Lab Results  Component Value Date   ALT 15 06/05/2022   AST  17 06/05/2022   ALKPHOS 80 06/05/2022   BILITOT 0.6 06/05/2022   Lab Results  Component Value Date   HGBA1C 5.1 07/11/2022   HGBA1C 5.3 01/22/2022   HGBA1C 5.3 07/11/2021   HGBA1C 5.5 11/02/2020   HGBA1C 5.5 04/27/2020   Lab Results  Component Value Date   INSULIN 10.8 07/11/2022   INSULIN 11.6 01/22/2022   INSULIN 7.3 07/11/2021   INSULIN 10.2 11/02/2020   INSULIN 9.2 04/27/2020   Lab Results  Component Value Date   TSH 1.870 12/22/2019   Lab  Results  Component Value Date   CHOL 181 01/22/2022   HDL 73 01/22/2022   LDLCALC 89 01/22/2022   TRIG 109 01/22/2022   Lab Results  Component Value Date   VD25OH 62.1 07/11/2022   VD25OH 57.5 01/22/2022   VD25OH 64.5 07/11/2021   Lab Results  Component Value Date   WBC 7.5 06/05/2022   HGB 14.1 06/05/2022   HCT 41.4 06/05/2022   MCV 88.5 06/05/2022   PLT 248 06/05/2022   No results found for: "IRON", "TIBC", "FERRITIN"  Obesity Behavioral Intervention:   Approximately 15 minutes were spent on the discussion below.  ASK: We discussed the diagnosis of obesity with Jackie Montoya today and Jackie Montoya agreed to give [place] permission to discuss obesity behavioral modification therapy today.  ASSESS: Azyah has the diagnosis of obesity and her BMI today is 39.2. Monty is in the action stage of change.   ADVISE: Jackie Montoya was educated on the multiple health risks of obesity as well as the benefit of weight loss to improve her health. She was advised of the need for long term treatment and the importance of lifestyle modifications to improve her current health and to decrease her risk of future health problems.  AGREE: Multiple dietary modification options and treatment options were discussed and Jackie Montoya agreed to follow the recommendations documented in the above note.  ARRANGE: Jackie Montoya was educated on the importance of frequent visits to treat obesity as outlined per CMS and USPSTF guidelines and agreed to schedule her next follow up appointment today.  Attestation Statements:   Reviewed by clinician on day of visit: allergies, medications, problem list, medical history, surgical history, family history, social history, and previous encounter notes.   Jackie Montoya, am acting as transcriptionist for Jackie Marble, NP.  I have reviewed the above documentation for accuracy and completeness, and I agree with the above. -  ***

## 2022-07-17 NOTE — Telephone Encounter (Signed)
Please review

## 2022-07-22 ENCOUNTER — Encounter: Payer: Self-pay | Admitting: *Deleted

## 2022-07-24 ENCOUNTER — Encounter (INDEPENDENT_AMBULATORY_CARE_PROVIDER_SITE_OTHER): Payer: Self-pay

## 2022-07-30 DIAGNOSIS — Z170 Estrogen receptor positive status [ER+]: Secondary | ICD-10-CM | POA: Diagnosis not present

## 2022-07-30 DIAGNOSIS — Z510 Encounter for antineoplastic radiation therapy: Secondary | ICD-10-CM | POA: Diagnosis not present

## 2022-07-30 DIAGNOSIS — C50212 Malignant neoplasm of upper-inner quadrant of left female breast: Secondary | ICD-10-CM | POA: Diagnosis not present

## 2022-07-31 ENCOUNTER — Other Ambulatory Visit (INDEPENDENT_AMBULATORY_CARE_PROVIDER_SITE_OTHER): Payer: Self-pay | Admitting: Adult Health

## 2022-07-31 ENCOUNTER — Other Ambulatory Visit: Payer: Self-pay

## 2022-07-31 ENCOUNTER — Ambulatory Visit
Admission: RE | Admit: 2022-07-31 | Discharge: 2022-07-31 | Disposition: A | Discharge status: Home / Self Care | Payer: Medicare Other | Source: Ambulatory Visit | Attending: Radiation Oncology | Admitting: Radiation Oncology

## 2022-07-31 DIAGNOSIS — C50212 Malignant neoplasm of upper-inner quadrant of left female breast: Secondary | ICD-10-CM | POA: Diagnosis not present

## 2022-07-31 DIAGNOSIS — Z170 Estrogen receptor positive status [ER+]: Secondary | ICD-10-CM | POA: Diagnosis not present

## 2022-07-31 DIAGNOSIS — R7301 Impaired fasting glucose: Secondary | ICD-10-CM

## 2022-07-31 DIAGNOSIS — Z510 Encounter for antineoplastic radiation therapy: Secondary | ICD-10-CM | POA: Diagnosis not present

## 2022-07-31 LAB — RAD ONC ARIA SESSION SUMMARY
Course Elapsed Days: 0
Plan Fractions Treated to Date: 1
Plan Prescribed Dose Per Fraction: 2.67 Gy
Plan Total Fractions Prescribed: 15
Plan Total Prescribed Dose: 40.05 Gy
Reference Point Dosage Given to Date: 2.67 Gy
Reference Point Session Dosage Given: 2.67 Gy
Session Number: 1

## 2022-08-01 ENCOUNTER — Other Ambulatory Visit: Payer: Self-pay

## 2022-08-01 ENCOUNTER — Ambulatory Visit
Admission: RE | Admit: 2022-08-01 | Discharge: 2022-08-01 | Disposition: A | Discharge status: Home / Self Care | Payer: Medicare Other | Source: Ambulatory Visit | Attending: Radiation Oncology | Admitting: Radiation Oncology

## 2022-08-01 DIAGNOSIS — Z510 Encounter for antineoplastic radiation therapy: Secondary | ICD-10-CM | POA: Diagnosis not present

## 2022-08-01 DIAGNOSIS — Z170 Estrogen receptor positive status [ER+]: Secondary | ICD-10-CM | POA: Diagnosis not present

## 2022-08-01 DIAGNOSIS — C50212 Malignant neoplasm of upper-inner quadrant of left female breast: Secondary | ICD-10-CM | POA: Diagnosis not present

## 2022-08-01 LAB — RAD ONC ARIA SESSION SUMMARY
Course Elapsed Days: 1
Plan Fractions Treated to Date: 2
Plan Prescribed Dose Per Fraction: 2.67 Gy
Plan Total Fractions Prescribed: 15
Plan Total Prescribed Dose: 40.05 Gy
Reference Point Dosage Given to Date: 5.34 Gy
Reference Point Session Dosage Given: 2.67 Gy
Session Number: 2

## 2022-08-02 ENCOUNTER — Other Ambulatory Visit: Payer: Self-pay

## 2022-08-02 ENCOUNTER — Ambulatory Visit
Admission: RE | Admit: 2022-08-02 | Discharge: 2022-08-02 | Disposition: A | Discharge status: Home / Self Care | Payer: Medicare Other | Source: Ambulatory Visit | Attending: Radiation Oncology | Admitting: Radiation Oncology

## 2022-08-02 DIAGNOSIS — Z170 Estrogen receptor positive status [ER+]: Secondary | ICD-10-CM | POA: Diagnosis not present

## 2022-08-02 DIAGNOSIS — C50212 Malignant neoplasm of upper-inner quadrant of left female breast: Secondary | ICD-10-CM | POA: Diagnosis not present

## 2022-08-02 DIAGNOSIS — Z510 Encounter for antineoplastic radiation therapy: Secondary | ICD-10-CM | POA: Diagnosis not present

## 2022-08-02 LAB — RAD ONC ARIA SESSION SUMMARY
Course Elapsed Days: 2
Plan Fractions Treated to Date: 3
Plan Prescribed Dose Per Fraction: 2.67 Gy
Plan Total Fractions Prescribed: 15
Plan Total Prescribed Dose: 40.05 Gy
Reference Point Dosage Given to Date: 8.01 Gy
Reference Point Session Dosage Given: 2.67 Gy
Session Number: 3

## 2022-08-05 ENCOUNTER — Other Ambulatory Visit: Payer: Self-pay

## 2022-08-05 ENCOUNTER — Ambulatory Visit
Admission: RE | Admit: 2022-08-05 | Discharge: 2022-08-05 | Disposition: A | Discharge status: Home / Self Care | Payer: Medicare Other | Source: Ambulatory Visit | Attending: Radiation Oncology | Admitting: Radiation Oncology

## 2022-08-05 DIAGNOSIS — C50212 Malignant neoplasm of upper-inner quadrant of left female breast: Secondary | ICD-10-CM | POA: Diagnosis not present

## 2022-08-05 DIAGNOSIS — Z510 Encounter for antineoplastic radiation therapy: Secondary | ICD-10-CM | POA: Diagnosis not present

## 2022-08-05 DIAGNOSIS — Z170 Estrogen receptor positive status [ER+]: Secondary | ICD-10-CM | POA: Diagnosis not present

## 2022-08-05 LAB — RAD ONC ARIA SESSION SUMMARY
Course Elapsed Days: 5
Plan Fractions Treated to Date: 4
Plan Prescribed Dose Per Fraction: 2.67 Gy
Plan Total Fractions Prescribed: 15
Plan Total Prescribed Dose: 40.05 Gy
Reference Point Dosage Given to Date: 10.68 Gy
Reference Point Session Dosage Given: 2.67 Gy
Session Number: 4

## 2022-08-05 MED ORDER — ALRA NON-METALLIC DEODORANT (RAD-ONC)
1.0000 | Freq: Once | TOPICAL | Status: AC
  Administered 2022-08-05: 1 via TOPICAL

## 2022-08-05 MED ORDER — RADIAPLEXRX EX GEL: Freq: Two times a day (BID) | CUTANEOUS | Status: DC

## 2022-08-05 NOTE — Progress Notes (Signed)
Pt here for patient teaching.  Pt given Radiation and You booklet, skin care instructions, Alra deodorant, and Radiaplex gel.  Reviewed areas of pertinence such as fatigue, skin changes, breast tenderness, and breast swelling . Pt able to give teach back of to pat skin and use unscented/gentle soap,apply Radiaplex bid, avoid applying anything to skin within 4 hours of treatment, avoid wearing an under wire bra, and to use an electric razor if they must shave. Pt verbalizes understanding of information given and will contact nursing with any questions or concerns.     [email or web address]

## 2022-08-06 ENCOUNTER — Ambulatory Visit: Payer: Medicare Other

## 2022-08-06 NOTE — Progress Notes (Signed)
Patient call stating that her knee is hurting today and she does not feel as if she can get up on the table . States that she wants to cancel her treatment for the day. Rn will notify the [facility]

## 2022-08-07 ENCOUNTER — Ambulatory Visit
Admission: RE | Admit: 2022-08-07 | Discharge: 2022-08-07 | Disposition: A | Discharge status: Home / Self Care | Payer: Medicare Other | Source: Ambulatory Visit | Attending: Radiation Oncology | Admitting: Radiation Oncology

## 2022-08-07 ENCOUNTER — Other Ambulatory Visit: Payer: Self-pay

## 2022-08-07 DIAGNOSIS — Z510 Encounter for antineoplastic radiation therapy: Secondary | ICD-10-CM | POA: Diagnosis not present

## 2022-08-07 DIAGNOSIS — Z170 Estrogen receptor positive status [ER+]: Secondary | ICD-10-CM | POA: Diagnosis not present

## 2022-08-07 DIAGNOSIS — C50212 Malignant neoplasm of upper-inner quadrant of left female breast: Secondary | ICD-10-CM | POA: Diagnosis not present

## 2022-08-07 LAB — RAD ONC ARIA SESSION SUMMARY
Course Elapsed Days: 7
Plan Fractions Treated to Date: 5
Plan Prescribed Dose Per Fraction: 2.67 Gy
Plan Total Fractions Prescribed: 15
Plan Total Prescribed Dose: 40.05 Gy
Reference Point Dosage Given to Date: 13.35 Gy
Reference Point Session Dosage Given: 2.67 Gy
Session Number: 5

## 2022-08-08 ENCOUNTER — Other Ambulatory Visit: Payer: Self-pay

## 2022-08-08 ENCOUNTER — Ambulatory Visit
Admission: RE | Admit: 2022-08-08 | Discharge: 2022-08-08 | Disposition: A | Discharge status: Home / Self Care | Payer: Medicare Other | Source: Ambulatory Visit | Attending: Radiation Oncology | Admitting: Radiation Oncology

## 2022-08-08 DIAGNOSIS — Z510 Encounter for antineoplastic radiation therapy: Secondary | ICD-10-CM | POA: Diagnosis not present

## 2022-08-08 DIAGNOSIS — C50212 Malignant neoplasm of upper-inner quadrant of left female breast: Secondary | ICD-10-CM | POA: Diagnosis not present

## 2022-08-08 DIAGNOSIS — Z170 Estrogen receptor positive status [ER+]: Secondary | ICD-10-CM | POA: Diagnosis not present

## 2022-08-08 LAB — RAD ONC ARIA SESSION SUMMARY
Course Elapsed Days: 8
Plan Fractions Treated to Date: 6
Plan Prescribed Dose Per Fraction: 2.67 Gy
Plan Total Fractions Prescribed: 15
Plan Total Prescribed Dose: 40.05 Gy
Reference Point Dosage Given to Date: 16.02 Gy
Reference Point Session Dosage Given: 2.67 Gy
Session Number: 6

## 2022-08-09 ENCOUNTER — Other Ambulatory Visit: Payer: Self-pay

## 2022-08-09 ENCOUNTER — Ambulatory Visit
Admission: RE | Admit: 2022-08-09 | Discharge: 2022-08-09 | Disposition: A | Discharge status: Home / Self Care | Payer: Medicare Other | Source: Ambulatory Visit | Attending: Radiation Oncology | Admitting: Radiation Oncology

## 2022-08-09 DIAGNOSIS — C50212 Malignant neoplasm of upper-inner quadrant of left female breast: Secondary | ICD-10-CM | POA: Diagnosis not present

## 2022-08-09 DIAGNOSIS — Z510 Encounter for antineoplastic radiation therapy: Secondary | ICD-10-CM | POA: Diagnosis not present

## 2022-08-09 DIAGNOSIS — Z170 Estrogen receptor positive status [ER+]: Secondary | ICD-10-CM | POA: Diagnosis not present

## 2022-08-09 LAB — RAD ONC ARIA SESSION SUMMARY
Course Elapsed Days: 9
Plan Fractions Treated to Date: 7
Plan Prescribed Dose Per Fraction: 2.67 Gy
Plan Total Fractions Prescribed: 15
Plan Total Prescribed Dose: 40.05 Gy
Reference Point Dosage Given to Date: 18.69 Gy
Reference Point Session Dosage Given: 2.67 Gy
Session Number: 7

## 2022-08-12 ENCOUNTER — Ambulatory Visit: Payer: Medicare Other

## 2022-08-12 ENCOUNTER — Other Ambulatory Visit: Payer: Self-pay

## 2022-08-12 ENCOUNTER — Ambulatory Visit
Admission: RE | Admit: 2022-08-12 | Discharge: 2022-08-12 | Disposition: A | Discharge status: Home / Self Care | Payer: Medicare Other | Source: Ambulatory Visit | Attending: Radiation Oncology | Admitting: Radiation Oncology

## 2022-08-12 DIAGNOSIS — Z510 Encounter for antineoplastic radiation therapy: Secondary | ICD-10-CM | POA: Diagnosis not present

## 2022-08-12 DIAGNOSIS — C50212 Malignant neoplasm of upper-inner quadrant of left female breast: Secondary | ICD-10-CM | POA: Diagnosis not present

## 2022-08-12 DIAGNOSIS — Z170 Estrogen receptor positive status [ER+]: Secondary | ICD-10-CM | POA: Diagnosis not present

## 2022-08-12 LAB — RAD ONC ARIA SESSION SUMMARY
Course Elapsed Days: 12
Plan Fractions Treated to Date: 8
Plan Prescribed Dose Per Fraction: 2.67 Gy
Plan Total Fractions Prescribed: 15
Plan Total Prescribed Dose: 40.05 Gy
Reference Point Dosage Given to Date: 21.36 Gy
Reference Point Session Dosage Given: 2.67 Gy
Session Number: 8

## 2022-08-13 ENCOUNTER — Ambulatory Visit
Admission: RE | Admit: 2022-08-13 | Discharge: 2022-08-13 | Disposition: A | Discharge status: Home / Self Care | Payer: Medicare Other | Source: Ambulatory Visit | Attending: Radiation Oncology | Admitting: Radiation Oncology

## 2022-08-13 ENCOUNTER — Other Ambulatory Visit: Payer: Self-pay

## 2022-08-13 DIAGNOSIS — Z510 Encounter for antineoplastic radiation therapy: Secondary | ICD-10-CM | POA: Diagnosis not present

## 2022-08-13 DIAGNOSIS — C50212 Malignant neoplasm of upper-inner quadrant of left female breast: Secondary | ICD-10-CM | POA: Diagnosis not present

## 2022-08-13 DIAGNOSIS — Z170 Estrogen receptor positive status [ER+]: Secondary | ICD-10-CM | POA: Diagnosis not present

## 2022-08-13 LAB — RAD ONC ARIA SESSION SUMMARY
Course Elapsed Days: 13
Plan Fractions Treated to Date: 9
Plan Prescribed Dose Per Fraction: 2.67 Gy
Plan Total Fractions Prescribed: 15
Plan Total Prescribed Dose: 40.05 Gy
Reference Point Dosage Given to Date: 24.03 Gy
Reference Point Session Dosage Given: 2.67 Gy
Session Number: 9

## 2022-08-14 ENCOUNTER — Other Ambulatory Visit: Payer: Self-pay

## 2022-08-14 ENCOUNTER — Encounter (INDEPENDENT_AMBULATORY_CARE_PROVIDER_SITE_OTHER): Payer: Self-pay | Admitting: Adult Health

## 2022-08-14 ENCOUNTER — Ambulatory Visit (INDEPENDENT_AMBULATORY_CARE_PROVIDER_SITE_OTHER): Payer: Medicare Other | Admitting: Adult Health

## 2022-08-14 ENCOUNTER — Ambulatory Visit
Admission: RE | Admit: 2022-08-14 | Discharge: 2022-08-14 | Disposition: A | Discharge status: Home / Self Care | Payer: Medicare Other | Source: Ambulatory Visit | Attending: Radiation Oncology | Admitting: Radiation Oncology

## 2022-08-14 VITALS — BP 118/73 | HR 79 | Temp 98.3°F | Ht 65.0 in | Wt 236.0 lb

## 2022-08-14 DIAGNOSIS — Z510 Encounter for antineoplastic radiation therapy: Secondary | ICD-10-CM | POA: Diagnosis not present

## 2022-08-14 DIAGNOSIS — E559 Vitamin D deficiency, unspecified: Secondary | ICD-10-CM

## 2022-08-14 DIAGNOSIS — E669 Obesity, unspecified: Secondary | ICD-10-CM | POA: Diagnosis not present

## 2022-08-14 DIAGNOSIS — Z6839 Body mass index (BMI) 39.0-39.9, adult: Secondary | ICD-10-CM | POA: Diagnosis not present

## 2022-08-14 DIAGNOSIS — R7301 Impaired fasting glucose: Secondary | ICD-10-CM | POA: Diagnosis not present

## 2022-08-14 DIAGNOSIS — C50212 Malignant neoplasm of upper-inner quadrant of left female breast: Secondary | ICD-10-CM | POA: Diagnosis not present

## 2022-08-14 DIAGNOSIS — Z170 Estrogen receptor positive status [ER+]: Secondary | ICD-10-CM | POA: Diagnosis not present

## 2022-08-14 LAB — RAD ONC ARIA SESSION SUMMARY
Course Elapsed Days: 14
Plan Fractions Treated to Date: 10
Plan Prescribed Dose Per Fraction: 2.67 Gy
Plan Total Fractions Prescribed: 15
Plan Total Prescribed Dose: 40.05 Gy
Reference Point Dosage Given to Date: 26.7 Gy
Reference Point Session Dosage Given: 2.67 Gy
Session Number: 10

## 2022-08-14 MED ORDER — SEMAGLUTIDE (2 MG/DOSE) 8 MG/3ML ~~LOC~~ SOPN: 2.0000 mg | PEN_INJECTOR | SUBCUTANEOUS | 0 refills | Status: DC

## 2022-08-15 ENCOUNTER — Other Ambulatory Visit: Payer: Self-pay

## 2022-08-15 ENCOUNTER — Ambulatory Visit
Admission: RE | Admit: 2022-08-15 | Discharge: 2022-08-15 | Disposition: A | Discharge status: Home / Self Care | Payer: Medicare Other | Source: Ambulatory Visit | Attending: Radiation Oncology | Admitting: Radiation Oncology

## 2022-08-15 DIAGNOSIS — Z510 Encounter for antineoplastic radiation therapy: Secondary | ICD-10-CM | POA: Diagnosis not present

## 2022-08-15 DIAGNOSIS — C50212 Malignant neoplasm of upper-inner quadrant of left female breast: Secondary | ICD-10-CM | POA: Diagnosis not present

## 2022-08-15 DIAGNOSIS — Z170 Estrogen receptor positive status [ER+]: Secondary | ICD-10-CM | POA: Diagnosis not present

## 2022-08-15 LAB — RAD ONC ARIA SESSION SUMMARY
Course Elapsed Days: 15
Plan Fractions Treated to Date: 11
Plan Prescribed Dose Per Fraction: 2.67 Gy
Plan Total Fractions Prescribed: 15
Plan Total Prescribed Dose: 40.05 Gy
Reference Point Dosage Given to Date: 29.37 Gy
Reference Point Session Dosage Given: 2.67 Gy
Session Number: 11

## 2022-08-16 ENCOUNTER — Other Ambulatory Visit: Payer: Self-pay

## 2022-08-16 ENCOUNTER — Ambulatory Visit
Admission: RE | Admit: 2022-08-16 | Discharge: 2022-08-16 | Disposition: A | Discharge status: Home / Self Care | Payer: Medicare Other | Source: Ambulatory Visit | Attending: Radiation Oncology | Admitting: Radiation Oncology

## 2022-08-16 DIAGNOSIS — C50212 Malignant neoplasm of upper-inner quadrant of left female breast: Secondary | ICD-10-CM | POA: Diagnosis not present

## 2022-08-16 DIAGNOSIS — Z510 Encounter for antineoplastic radiation therapy: Secondary | ICD-10-CM | POA: Diagnosis not present

## 2022-08-16 DIAGNOSIS — Z170 Estrogen receptor positive status [ER+]: Secondary | ICD-10-CM | POA: Insufficient documentation

## 2022-08-16 LAB — RAD ONC ARIA SESSION SUMMARY
Course Elapsed Days: 16
Plan Fractions Treated to Date: 12
Plan Prescribed Dose Per Fraction: 2.67 Gy
Plan Total Fractions Prescribed: 15
Plan Total Prescribed Dose: 40.05 Gy
Reference Point Dosage Given to Date: 32.04 Gy
Reference Point Session Dosage Given: 2.67 Gy
Session Number: 12

## 2022-08-18 NOTE — Progress Notes (Unsigned)
Chief Complaint:   OBESITY Jackie Montoya is here to discuss her progress with her obesity treatment plan along with follow-up of her obesity related diagnoses. Jackie Montoya is on keeping a food journal and adhering to recommended goals of 1300 calories and 100 protein and states she is following her eating plan approximately 60% of the time. Jackie Montoya states she is not exercising.   Today's visit was #: 18 Starting weight: 287 lbs Starting date: 12/22/2019 Today's weight: 236 lbs Today's date: 08/14/2022 Total lbs lost to date: 51 lbs Total lbs lost since last in-office visit: +1 lb  Interim History: she is tracking intake 60%.  07/18/2022 lumpectomy.  M-F radiation daily, cracker barrel and KW after radiation.  08/22/2022, final session of radiation.   Subjective:   1. IFG (impaired fasting glucose) Discussed labs with patient today. Currently on Ozempic 2 mg once weekly, ***.  07/11/2022, A1c 5.1, Insulin level 10.8.   2. Vitamin D deficiency Discussed labs with patient today. She is on OTC Vitamin D-3, 5,000 IU daily.  07/11/2022, Vitamin D level 62.1.  Assessment/Plan:   1. IFG (impaired fasting glucose) Refill - Semaglutide, 2 MG/DOSE, 8 MG/3ML SOPN; Inject 2 mg as directed once a week.  Dispense: 9 mL; Refill: 0  2. Vitamin D deficiency Continue Vitamin D-3 OTC supplementation.   3. Obesity: Current BMI 39.3 Jackie Montoya is currently in the action stage of change. As such, her goal is to continue with weight loss efforts. She has agreed to keeping a food journal and adhering to recommended goals of 1300 calories and 100 protein.   Exercise goals: No exercise has been prescribed at this time.  Behavioral modification strategies: increasing lean protein intake, decreasing simple carbohydrates, meal planning and cooking strategies, keeping healthy foods in the home, and planning for success.  Jackie Montoya has agreed to follow-up with our clinic in 4 weeks. She was informed of the importance of  frequent follow-up visits to maximize her success with intensive lifestyle modifications for her multiple health conditions.   Objective:   Blood pressure 118/73, pulse 79, temperature 98.3 F (36.8 C), height '5\' 5"'$  (1.651 m), weight 236 lb (107 kg), last menstrual period 06/15/2002, SpO2 97 %. Body mass index is 39.27 kg/m.  General: Cooperative, alert, well developed, in no acute distress. HEENT: Conjunctivae and lids unremarkable. Cardiovascular: Regular rhythm.  Lungs: Normal work of breathing. Neurologic: No focal deficits.   Lab Results  Component Value Date   CREATININE 0.76 06/05/2022   BUN 19 06/05/2022   NA 137 06/05/2022   K 3.9 06/05/2022   CL 102 06/05/2022   CO2 29 06/05/2022   Lab Results  Component Value Date   ALT 15 06/05/2022   AST 17 06/05/2022   ALKPHOS 80 06/05/2022   BILITOT 0.6 06/05/2022   Lab Results  Component Value Date   HGBA1C 5.1 07/11/2022   HGBA1C 5.3 01/22/2022   HGBA1C 5.3 07/11/2021   HGBA1C 5.5 11/02/2020   HGBA1C 5.5 04/27/2020   Lab Results  Component Value Date   INSULIN 10.8 07/11/2022   INSULIN 11.6 01/22/2022   INSULIN 7.3 07/11/2021   INSULIN 10.2 11/02/2020   INSULIN 9.2 04/27/2020   Lab Results  Component Value Date   TSH 1.870 12/22/2019   Lab Results  Component Value Date   CHOL 181 01/22/2022   HDL 73 01/22/2022   LDLCALC 89 01/22/2022   TRIG 109 01/22/2022   Lab Results  Component Value Date   VD25OH 62.1 07/11/2022  VD25OH 57.5 01/22/2022   VD25OH 64.5 07/11/2021   Lab Results  Component Value Date   WBC 7.5 06/05/2022   HGB 14.1 06/05/2022   HCT 41.4 06/05/2022   MCV 88.5 06/05/2022   PLT 248 06/05/2022   No results found for: "IRON", "TIBC", "FERRITIN"  Obesity Behavioral Intervention:   Approximately 15 minutes were spent on the discussion below.  ASK: We discussed the diagnosis of obesity with Jackie Montoya today and Jackie Montoya agreed to give [place] permission to discuss obesity behavioral  modification therapy today.  ASSESS: Laterra has the diagnosis of obesity and her BMI today is 39.3. Jackie Montoya is in the action stage of change.   ADVISE: Jackie Montoya was educated on the multiple health risks of obesity as well as the benefit of weight loss to improve her health. She was advised of the need for long term treatment and the importance of lifestyle modifications to improve her current health and to decrease her risk of future health problems.  AGREE: Multiple dietary modification options and treatment options were discussed and Jackie Montoya agreed to follow the recommendations documented in the above note.  ARRANGE: Jackie Montoya was educated on the importance of frequent visits to treat obesity as outlined per CMS and USPSTF guidelines and agreed to schedule her next follow up appointment today.  Attestation Statements:   Reviewed by clinician on day of visit: allergies, medications, problem list, medical history, surgical history, family history, social history, and previous encounter notes.  I, Davy Pique, RMA, am acting as Location manager for Mina Marble, NP.  I have reviewed the above documentation for accuracy and completeness, and I agree with the above. -  ***

## 2022-08-20 ENCOUNTER — Ambulatory Visit: Payer: Medicare Other

## 2022-08-20 ENCOUNTER — Ambulatory Visit
Admission: RE | Admit: 2022-08-20 | Discharge: 2022-08-20 | Disposition: A | Discharge status: Home / Self Care | Payer: Medicare Other | Source: Ambulatory Visit | Attending: Radiation Oncology | Admitting: Radiation Oncology

## 2022-08-20 ENCOUNTER — Other Ambulatory Visit: Payer: Self-pay

## 2022-08-20 DIAGNOSIS — Z510 Encounter for antineoplastic radiation therapy: Secondary | ICD-10-CM | POA: Diagnosis not present

## 2022-08-20 DIAGNOSIS — C50212 Malignant neoplasm of upper-inner quadrant of left female breast: Secondary | ICD-10-CM | POA: Diagnosis not present

## 2022-08-20 DIAGNOSIS — Z170 Estrogen receptor positive status [ER+]: Secondary | ICD-10-CM | POA: Diagnosis not present

## 2022-08-20 LAB — RAD ONC ARIA SESSION SUMMARY
Course Elapsed Days: 20
Plan Fractions Treated to Date: 13
Plan Prescribed Dose Per Fraction: 2.67 Gy
Plan Total Fractions Prescribed: 15
Plan Total Prescribed Dose: 40.05 Gy
Reference Point Dosage Given to Date: 34.71 Gy
Reference Point Session Dosage Given: 2.67 Gy
Session Number: 13

## 2022-08-21 ENCOUNTER — Ambulatory Visit
Admission: RE | Admit: 2022-08-21 | Discharge: 2022-08-21 | Disposition: A | Discharge status: Home / Self Care | Payer: Medicare Other | Source: Ambulatory Visit | Attending: Radiation Oncology | Admitting: Radiation Oncology

## 2022-08-21 ENCOUNTER — Ambulatory Visit: Payer: Medicare Other

## 2022-08-21 ENCOUNTER — Encounter: Payer: Self-pay | Admitting: *Deleted

## 2022-08-21 ENCOUNTER — Other Ambulatory Visit: Payer: Self-pay

## 2022-08-21 DIAGNOSIS — Z510 Encounter for antineoplastic radiation therapy: Secondary | ICD-10-CM | POA: Diagnosis not present

## 2022-08-21 DIAGNOSIS — C50212 Malignant neoplasm of upper-inner quadrant of left female breast: Secondary | ICD-10-CM | POA: Diagnosis not present

## 2022-08-21 DIAGNOSIS — Z170 Estrogen receptor positive status [ER+]: Secondary | ICD-10-CM | POA: Diagnosis not present

## 2022-08-21 LAB — RAD ONC ARIA SESSION SUMMARY
Course Elapsed Days: 21
Plan Fractions Treated to Date: 14
Plan Prescribed Dose Per Fraction: 2.67 Gy
Plan Total Fractions Prescribed: 15
Plan Total Prescribed Dose: 40.05 Gy
Reference Point Dosage Given to Date: 37.38 Gy
Reference Point Session Dosage Given: 2.67 Gy
Session Number: 14

## 2022-08-22 ENCOUNTER — Encounter: Payer: Self-pay | Admitting: Radiation Oncology

## 2022-08-22 ENCOUNTER — Ambulatory Visit
Admission: RE | Admit: 2022-08-22 | Discharge: 2022-08-22 | Disposition: A | Discharge status: Home / Self Care | Payer: Medicare Other | Source: Ambulatory Visit | Attending: Radiation Oncology | Admitting: Radiation Oncology

## 2022-08-22 ENCOUNTER — Other Ambulatory Visit: Payer: Self-pay

## 2022-08-22 DIAGNOSIS — C50212 Malignant neoplasm of upper-inner quadrant of left female breast: Secondary | ICD-10-CM | POA: Diagnosis not present

## 2022-08-22 DIAGNOSIS — Z170 Estrogen receptor positive status [ER+]: Secondary | ICD-10-CM | POA: Diagnosis not present

## 2022-08-22 DIAGNOSIS — Z510 Encounter for antineoplastic radiation therapy: Secondary | ICD-10-CM | POA: Diagnosis not present

## 2022-08-22 LAB — RAD ONC ARIA SESSION SUMMARY
Course Elapsed Days: 22
Plan Fractions Treated to Date: 15
Plan Prescribed Dose Per Fraction: 2.67 Gy
Plan Total Fractions Prescribed: 15
Plan Total Prescribed Dose: 40.05 Gy
Reference Point Dosage Given to Date: 40.05 Gy
Reference Point Session Dosage Given: 2.67 Gy
Session Number: 15

## 2022-08-26 DIAGNOSIS — Z853 Personal history of malignant neoplasm of breast: Secondary | ICD-10-CM | POA: Diagnosis not present

## 2022-08-26 DIAGNOSIS — Z01419 Encounter for gynecological examination (general) (routine) without abnormal findings: Secondary | ICD-10-CM | POA: Diagnosis not present

## 2022-08-27 ENCOUNTER — Encounter: Payer: Self-pay | Admitting: Adult Health

## 2022-08-27 ENCOUNTER — Other Ambulatory Visit: Payer: Self-pay | Admitting: *Deleted

## 2022-08-27 MED ORDER — LETROZOLE 2.5 MG PO TABS: 2.5000 mg | ORAL_TABLET | Freq: Every day | ORAL | 0 refills | Status: DC

## 2022-08-29 DIAGNOSIS — Z23 Encounter for immunization: Secondary | ICD-10-CM | POA: Diagnosis not present

## 2022-09-02 DIAGNOSIS — Z23 Encounter for immunization: Secondary | ICD-10-CM | POA: Diagnosis not present

## 2022-09-11 NOTE — Progress Notes (Signed)
                                                                                                                                                             Patient Name: Jackie Montoya MRN: [id number] DOB: [birth year] Referring Physician: Nicholas Lose (Profile Not Attached) Date of Service: 08/22/2022 [facility], [place]                                                        End Of Treatment Note  Diagnoses: C50.212-Malignant neoplasm of upper-inner quadrant of left female breast  Cancer Staging:  Cancer Staging  Malignant neoplasm of upper-inner quadrant of left breast in female, estrogen receptor positive ([place]) Staging form: Breast, AJCC 8th Edition - Clinical: Stage IA (cT1b, cN0, cM0, G1, ER+, PR+, HER2-) - Signed by Nicholas Lose, MD on 06/05/2022 Stage prefix: Initial diagnosis Histologic grading system: 3 grade system pT1b pNx  Intent: Curative  Radiation Treatment Dates: 07/31/2022 through 08/22/2022 Site Technique Total Dose (Gy) Dose per Fx (Gy) Completed Fx Beam Energies  Breast, Left: Breast_L_axilla 3D 40.05/40.05 2.67 15/15 15X, 10XFFF   Narrative: The patient tolerated radiation therapy relatively well.   Plan: The patient will follow-up with radiation oncology in 1 mo and / or prn. -----------------------------------  Eppie Gibson, MD

## 2022-09-12 ENCOUNTER — Ambulatory Visit (INDEPENDENT_AMBULATORY_CARE_PROVIDER_SITE_OTHER): Payer: Medicare Other | Admitting: Bariatrics

## 2022-09-12 ENCOUNTER — Encounter: Payer: Self-pay | Admitting: Bariatrics

## 2022-09-12 VITALS — BP 114/69 | HR 73 | Temp 97.8°F | Ht 65.0 in | Wt 233.0 lb

## 2022-09-12 DIAGNOSIS — Z6838 Body mass index (BMI) 38.0-38.9, adult: Secondary | ICD-10-CM

## 2022-09-12 DIAGNOSIS — E669 Obesity, unspecified: Secondary | ICD-10-CM | POA: Diagnosis not present

## 2022-09-12 DIAGNOSIS — E782 Mixed hyperlipidemia: Secondary | ICD-10-CM

## 2022-09-12 DIAGNOSIS — E8881 Metabolic syndrome: Secondary | ICD-10-CM | POA: Diagnosis not present

## 2022-09-16 NOTE — Progress Notes (Signed)
Chief Complaint:   OBESITY Jackie Montoya is here to discuss her progress with her obesity treatment plan along with follow-up of her obesity related diagnoses. Jackie Montoya is on keeping a food journal and adhering to recommended goals of 1300 calories and 100 grams of protein daily and states she is following her eating plan approximately 75% of the time. Jackie Montoya states she is lifting weights for 5 minutes 3 times per week.  Today's visit was #: 57 Starting weight: 287 lbs Starting date: 12/22/2019 Today's weight: 233 lbs Today's date: 09/12/2022 Total lbs lost to date: 54 Total lbs lost since last in-office visit: 3  Interim History: Jackie Montoya is down 3 lbs since her last visit. Her goal is less than 200 lbs.   Subjective:   1. Insulin resistance Jackie Montoya is taking Ozempic and doing ok.   2. Mixed hyperlipidemia Jackie Montoya is taking Lipitor.   Assessment/Plan:   1. Insulin resistance Jackie Montoya will continue Ozempic as directed.   2. Mixed hyperlipidemia Jackie Montoya will continue Lipitor. She is to eliminate trans fats, and increase MUFA's and PUFA's.   3. Obesity: Current BMI 38.8 Jackie Montoya is currently in the action stage of change. As such, her goal is to continue with weight loss efforts. She has agreed to keeping a food journal and adhering to recommended goals of 1300 calories and 100 grams of protein daily.   She will continue to adhere closely to the meal plan 80-90%.  Exercise goals: Increase exercise. Exercise bike and lifting.   Behavioral modification strategies: increasing lean protein intake, decreasing simple carbohydrates, increasing vegetables, increasing water intake, decreasing eating out, no skipping meals, meal planning and cooking strategies, keeping healthy foods in the home, and planning for success.  Jackie Montoya has agreed to follow-up with our clinic in 4 weeks. She was informed of the importance of frequent follow-up visits to maximize her success with intensive lifestyle modifications for  her multiple health conditions.   Objective:   Blood pressure 114/69, pulse 73, temperature 97.8 F (36.6 C), height '5\' 5"'$  (1.651 m), weight 233 lb (105.7 kg), last menstrual period 06/15/2002, SpO2 98 %. Body mass index is 38.77 kg/m.  General: Cooperative, alert, well developed, in no acute distress. HEENT: Conjunctivae and lids unremarkable. Cardiovascular: Regular rhythm.  Lungs: Normal work of breathing. Neurologic: No focal deficits.   Lab Results  Component Value Date   CREATININE 0.76 06/05/2022   BUN 19 06/05/2022   NA 137 06/05/2022   K 3.9 06/05/2022   CL 102 06/05/2022   CO2 29 06/05/2022   Lab Results  Component Value Date   ALT 15 06/05/2022   AST 17 06/05/2022   ALKPHOS 80 06/05/2022   BILITOT 0.6 06/05/2022   Lab Results  Component Value Date   HGBA1C 5.1 07/11/2022   HGBA1C 5.3 01/22/2022   HGBA1C 5.3 07/11/2021   HGBA1C 5.5 11/02/2020   HGBA1C 5.5 04/27/2020   Lab Results  Component Value Date   INSULIN 10.8 07/11/2022   INSULIN 11.6 01/22/2022   INSULIN 7.3 07/11/2021   INSULIN 10.2 11/02/2020   INSULIN 9.2 04/27/2020   Lab Results  Component Value Date   TSH 1.870 12/22/2019   Lab Results  Component Value Date   CHOL 181 01/22/2022   HDL 73 01/22/2022   LDLCALC 89 01/22/2022   TRIG 109 01/22/2022   Lab Results  Component Value Date   VD25OH 62.1 07/11/2022   VD25OH 57.5 01/22/2022   VD25OH 64.5 07/11/2021   Lab Results  Component Value  Date   WBC 7.5 06/05/2022   HGB 14.1 06/05/2022   HCT 41.4 06/05/2022   MCV 88.5 06/05/2022   PLT 248 06/05/2022   No results found for: "IRON", "TIBC", "FERRITIN"  Attestation Statements:   Reviewed by clinician on day of visit: allergies, medications, problem list, medical history, surgical history, family history, social history, and previous encounter notes.   [name], am acting as Location manager for [employer], DO.  I have reviewed the above documentation for accuracy  and completeness, and I agree with the above. Jearld Lesch, DO

## 2022-09-17 ENCOUNTER — Encounter: Payer: Self-pay | Admitting: Bariatrics

## 2022-09-23 DIAGNOSIS — Z6841 Body Mass Index (BMI) 40.0 and over, adult: Secondary | ICD-10-CM | POA: Diagnosis not present

## 2022-09-23 DIAGNOSIS — F900 Attention-deficit hyperactivity disorder, predominantly inattentive type: Secondary | ICD-10-CM | POA: Diagnosis not present

## 2022-09-23 DIAGNOSIS — J45909 Unspecified asthma, uncomplicated: Secondary | ICD-10-CM | POA: Diagnosis not present

## 2022-09-23 DIAGNOSIS — M5442 Lumbago with sciatica, left side: Secondary | ICD-10-CM | POA: Diagnosis not present

## 2022-09-23 DIAGNOSIS — F419 Anxiety disorder, unspecified: Secondary | ICD-10-CM | POA: Diagnosis not present

## 2022-09-23 DIAGNOSIS — M720 Palmar fascial fibromatosis [Dupuytren]: Secondary | ICD-10-CM | POA: Diagnosis not present

## 2022-09-23 DIAGNOSIS — Z0000 Encounter for general adult medical examination without abnormal findings: Secondary | ICD-10-CM | POA: Diagnosis not present

## 2022-09-23 DIAGNOSIS — R7303 Prediabetes: Secondary | ICD-10-CM | POA: Diagnosis not present

## 2022-09-23 DIAGNOSIS — K219 Gastro-esophageal reflux disease without esophagitis: Secondary | ICD-10-CM | POA: Diagnosis not present

## 2022-09-23 DIAGNOSIS — I10 Essential (primary) hypertension: Secondary | ICD-10-CM | POA: Diagnosis not present

## 2022-09-23 DIAGNOSIS — E782 Mixed hyperlipidemia: Secondary | ICD-10-CM | POA: Diagnosis not present

## 2022-09-23 DIAGNOSIS — F39 Unspecified mood [affective] disorder: Secondary | ICD-10-CM | POA: Diagnosis not present

## 2022-09-24 ENCOUNTER — Telehealth: Payer: Self-pay

## 2022-09-24 NOTE — Telephone Encounter (Signed)
I called the patient today about her upcoming follow-up appointment in radiation oncology.   Given the state of the COVID-19 pandemic, concerning case numbers in our community, and guidance from [facility], I offered a phone assessment with the patient to determine if coming to the clinic was necessary. She accepted.  The patient denies any symptomatic concerns.  She reports occasional fatigue, but feels it's manageable. She denies any lingering pain to her breast, or range of motion limitations to her left arm/shoulder. Specifically, she reports good healing of her skin in the radiation fields.  She experienced a small amount of peeling in her axilla after she completed radiation, but skin is now intact and well healed. I recommended that she continue skin care by applying oil or lotion with vitamin E to the skin in the radiation fields, BID, for 2 more months.    She confirms she has started her letrozole prescription and is tolerating fairly well. She experienced feelings of irritability and anxiousness the first week she started the medication, but says these have improved. I encouraged patient to keep a journal of these and other symptoms she experiences to review at her next medical oncology appointment. Follow-up with medical oncology is scheduled on 11/11/2022 with Annabelle Harman in the [place] clinic.  I explained that yearly mammograms are important for patients with intact breast tissue, and physical exams are important after mastectomy for patients that cannot undergo mammography.  I encouraged her to call if she had further questions or concerns about her healing. Otherwise, she will follow-up PRN in radiation oncology. Patient is pleased with this plan, and we will cancel her upcoming follow-up to reduce the risk of COVID-19 transmission.

## 2022-09-25 ENCOUNTER — Ambulatory Visit: Payer: Medicare Other | Admitting: Radiation Oncology

## 2022-09-30 DIAGNOSIS — M5442 Lumbago with sciatica, left side: Secondary | ICD-10-CM | POA: Diagnosis not present

## 2022-10-02 DIAGNOSIS — M5442 Lumbago with sciatica, left side: Secondary | ICD-10-CM | POA: Diagnosis not present

## 2022-10-07 DIAGNOSIS — M5442 Lumbago with sciatica, left side: Secondary | ICD-10-CM | POA: Diagnosis not present

## 2022-10-09 ENCOUNTER — Encounter: Payer: Self-pay | Admitting: Bariatrics

## 2022-10-09 ENCOUNTER — Ambulatory Visit (INDEPENDENT_AMBULATORY_CARE_PROVIDER_SITE_OTHER): Payer: Medicare Other | Admitting: Bariatrics

## 2022-10-09 VITALS — BP 122/70 | HR 78 | Temp 98.3°F | Ht 65.0 in | Wt 236.0 lb

## 2022-10-09 DIAGNOSIS — E669 Obesity, unspecified: Secondary | ICD-10-CM | POA: Diagnosis not present

## 2022-10-09 DIAGNOSIS — E88819 Insulin resistance, unspecified: Secondary | ICD-10-CM | POA: Diagnosis not present

## 2022-10-09 DIAGNOSIS — R7301 Impaired fasting glucose: Secondary | ICD-10-CM

## 2022-10-09 DIAGNOSIS — F5089 Other specified eating disorder: Secondary | ICD-10-CM | POA: Diagnosis not present

## 2022-10-09 DIAGNOSIS — F509 Eating disorder, unspecified: Secondary | ICD-10-CM | POA: Insufficient documentation

## 2022-10-09 DIAGNOSIS — E7849 Other hyperlipidemia: Secondary | ICD-10-CM

## 2022-10-09 DIAGNOSIS — Z6839 Body mass index (BMI) 39.0-39.9, adult: Secondary | ICD-10-CM | POA: Diagnosis not present

## 2022-10-09 DIAGNOSIS — M5442 Lumbago with sciatica, left side: Secondary | ICD-10-CM | POA: Diagnosis not present

## 2022-10-09 MED ORDER — TOPIRAMATE 50 MG PO TABS: 50.0000 mg | ORAL_TABLET | Freq: Every day | ORAL | 0 refills | Status: DC

## 2022-10-14 ENCOUNTER — Telehealth (INDEPENDENT_AMBULATORY_CARE_PROVIDER_SITE_OTHER): Payer: Self-pay | Admitting: Bariatrics

## 2022-10-14 ENCOUNTER — Encounter (INDEPENDENT_AMBULATORY_CARE_PROVIDER_SITE_OTHER): Payer: Self-pay

## 2022-10-14 NOTE — Telephone Encounter (Signed)
Dr. Owens Shark - Prior authorization denied for topiramate (TOPAMAX). Per insurance: does not support approval because the use of this drug is not a diagnosis covered under your Medicare Part D benefit. Patient sent denial message via mychart.

## 2022-10-16 DIAGNOSIS — M5442 Lumbago with sciatica, left side: Secondary | ICD-10-CM | POA: Diagnosis not present

## 2022-10-16 NOTE — Progress Notes (Signed)
Chief Complaint:   OBESITY Jackie Montoya is here to discuss her progress with her obesity treatment plan along with follow-up of her obesity related diagnoses. Jackie Montoya is on keeping a food journal and adhering to recommended goals of 1300 calories and 100 grams of protein and states she is following her eating plan approximately 60 % of the time. Jackie Montoya states she is doing physical therapy for 10-15 minutes 5 times per week.  Today's visit was #: 8 Starting weight: 287 lbs Starting date: 12/22/2019 Today's weight: 236 lbs Today's date: 10/09/2022 Total lbs lost to date: 51 lbs Total lbs lost since last in-office visit: 0  Interim History: Jackie Montoya is up 3 lbs since her last visit. She is struggling at this time, she has been craving sweets.  Subjective:   1. IFG (impaired fasting glucose) Jackie Montoya is taking Trulicity as directed.  2. Insulin resistance Jackie Montoya is taking Ozempic  3. Other hyperlipidemia Jackie Montoya is taking Lipitor as directed.  4. Other disorder of eating Jackie Montoya is taking Topamax for cravings in the evening.  Assessment/Plan:   1. IFG (impaired fasting glucose) Jackie Montoya will work on decreasing her carbohydrates (sweets and starches).  2. Insulin resistance  We will continue Ozempic as directed.  3. Other hyperlipidemia Will Continue Lipitor as and Healthy vs Unhealthy fats handouts were given.  4. Other disorder of eating Will continue Topamax 50 mg once daily, no refills.  - topiramate (TOPAMAX) 50 MG tablet; Take 1 tablet (50 mg total) by mouth daily with supper.  Dispense: 30 tablet; Refill: 0  5. Obesity: Current BMI 39.3 Jackie Montoya is currently in the action stage of change. As such, her goal is to continue with weight loss efforts. She has agreed to keeping a food journal and adhering to recommended goals of 1300 calories and 80-100 grams of protein.   Jackie Montoya will adhere to the plan 80-90%.  Exercise goals: As is.  Behavioral modification strategies: increasing lean  protein intake, decreasing simple carbohydrates, increasing vegetables, increasing water intake, decreasing eating out, no skipping meals, meal planning and cooking strategies, keeping healthy foods in the home, and planning for success.  Jackie Montoya has agreed to follow-up with our clinic in 4 weeks. She was informed of the importance of frequent follow-up visits to maximize her success with intensive lifestyle modifications for her multiple health conditions.   Objective:   Blood pressure 122/70, pulse 78, temperature 98.3 F (36.8 C), height '5\' 5"'$  (1.651 m), weight 236 lb (107 kg), last menstrual period 06/15/2002, SpO2 96 %. Body mass index is 39.27 kg/m.  General: Cooperative, alert, well developed, in no acute distress. HEENT: Conjunctivae and lids unremarkable. Cardiovascular: Regular rhythm.  Lungs: Normal work of breathing. Neurologic: No focal deficits.   Lab Results  Component Value Date   CREATININE 0.76 06/05/2022   BUN 19 06/05/2022   NA 137 06/05/2022   K 3.9 06/05/2022   CL 102 06/05/2022   CO2 29 06/05/2022   Lab Results  Component Value Date   ALT 15 06/05/2022   AST 17 06/05/2022   ALKPHOS 80 06/05/2022   BILITOT 0.6 06/05/2022   Lab Results  Component Value Date   HGBA1C 5.1 07/11/2022   HGBA1C 5.3 01/22/2022   HGBA1C 5.3 07/11/2021   HGBA1C 5.5 11/02/2020   HGBA1C 5.5 04/27/2020   Lab Results  Component Value Date   INSULIN 10.8 07/11/2022   INSULIN 11.6 01/22/2022   INSULIN 7.3 07/11/2021   INSULIN 10.2 11/02/2020   INSULIN 9.2 04/27/2020  Lab Results  Component Value Date   TSH 1.870 12/22/2019   Lab Results  Component Value Date   CHOL 181 01/22/2022   HDL 73 01/22/2022   LDLCALC 89 01/22/2022   TRIG 109 01/22/2022   Lab Results  Component Value Date   VD25OH 62.1 07/11/2022   VD25OH 57.5 01/22/2022   VD25OH 64.5 07/11/2021   Lab Results  Component Value Date   WBC 7.5 06/05/2022   HGB 14.1 06/05/2022   HCT 41.4 06/05/2022    MCV 88.5 06/05/2022   PLT 248 06/05/2022   No results found for: "IRON", "TIBC", "FERRITIN"  Attestation Statements:   Reviewed by clinician on day of visit: allergies, medications, problem list, medical history, surgical history, family history, social history, and previous encounter notes.  Jinger Neighbors, am acting as Location manager for [employer], DO.  I have reviewed the above documentation for accuracy and completeness, and I agree with the above. Jearld Lesch, DO

## 2022-10-22 DIAGNOSIS — M5442 Lumbago with sciatica, left side: Secondary | ICD-10-CM | POA: Diagnosis not present

## 2022-10-23 ENCOUNTER — Telehealth: Payer: Self-pay

## 2022-10-23 NOTE — Telephone Encounter (Signed)
Left message for patient to return call regarding Topamax denial.

## 2022-10-28 ENCOUNTER — Encounter: Payer: Self-pay | Admitting: Bariatrics

## 2022-11-05 DIAGNOSIS — M5442 Lumbago with sciatica, left side: Secondary | ICD-10-CM | POA: Diagnosis not present

## 2022-11-06 ENCOUNTER — Ambulatory Visit (INDEPENDENT_AMBULATORY_CARE_PROVIDER_SITE_OTHER): Payer: Medicare Other | Admitting: Bariatrics

## 2022-11-06 ENCOUNTER — Encounter: Payer: Self-pay | Admitting: Bariatrics

## 2022-11-06 VITALS — BP 128/71 | HR 80 | Temp 98.0°F | Ht 65.0 in | Wt 233.0 lb

## 2022-11-06 DIAGNOSIS — R7301 Impaired fasting glucose: Secondary | ICD-10-CM | POA: Diagnosis not present

## 2022-11-06 DIAGNOSIS — F5089 Other specified eating disorder: Secondary | ICD-10-CM | POA: Diagnosis not present

## 2022-11-06 DIAGNOSIS — Z6838 Body mass index (BMI) 38.0-38.9, adult: Secondary | ICD-10-CM | POA: Diagnosis not present

## 2022-11-06 DIAGNOSIS — E669 Obesity, unspecified: Secondary | ICD-10-CM | POA: Diagnosis not present

## 2022-11-06 MED ORDER — SEMAGLUTIDE (2 MG/DOSE) 8 MG/3ML ~~LOC~~ SOPN: 2.0000 mg | PEN_INJECTOR | SUBCUTANEOUS | 0 refills | Status: DC

## 2022-11-11 ENCOUNTER — Inpatient Hospital Stay: Payer: Medicare Other | Attending: Adult Health | Admitting: Adult Health

## 2022-11-11 ENCOUNTER — Encounter: Payer: Self-pay | Admitting: Adult Health

## 2022-11-11 VITALS — BP 147/69 | HR 83 | Temp 98.1°F | Resp 16 | Ht 65.0 in | Wt 245.7 lb

## 2022-11-11 DIAGNOSIS — Z803 Family history of malignant neoplasm of breast: Secondary | ICD-10-CM | POA: Diagnosis not present

## 2022-11-11 DIAGNOSIS — Z801 Family history of malignant neoplasm of trachea, bronchus and lung: Secondary | ICD-10-CM | POA: Diagnosis not present

## 2022-11-11 DIAGNOSIS — Z170 Estrogen receptor positive status [ER+]: Secondary | ICD-10-CM | POA: Insufficient documentation

## 2022-11-11 DIAGNOSIS — Z79811 Long term (current) use of aromatase inhibitors: Secondary | ICD-10-CM | POA: Insufficient documentation

## 2022-11-11 DIAGNOSIS — I10 Essential (primary) hypertension: Secondary | ICD-10-CM | POA: Diagnosis not present

## 2022-11-11 DIAGNOSIS — E2839 Other primary ovarian failure: Secondary | ICD-10-CM | POA: Diagnosis not present

## 2022-11-11 DIAGNOSIS — C50212 Malignant neoplasm of upper-inner quadrant of left female breast: Secondary | ICD-10-CM | POA: Insufficient documentation

## 2022-11-11 DIAGNOSIS — Z87891 Personal history of nicotine dependence: Secondary | ICD-10-CM | POA: Insufficient documentation

## 2022-11-11 DIAGNOSIS — Z800 Family history of malignant neoplasm of digestive organs: Secondary | ICD-10-CM | POA: Diagnosis not present

## 2022-11-11 NOTE — Progress Notes (Signed)
SURVIVORSHIP VISIT:   BRIEF ONCOLOGIC HISTORY:  Oncology History  Malignant neoplasm of upper-inner quadrant of left breast in female, estrogen receptor positive ([place])  06/03/2022 Initial Diagnosis   Malignant neoplasm of upper-inner quadrant of left breast in female, estrogen receptor positive ([place])   06/05/2022 Cancer Staging   Staging form: Breast, AJCC 8th Edition - Clinical: Stage IA (cT1b, cN0, cM0, G1, ER+, PR+, HER2-) - Signed by Nicholas Lose, MD on 06/05/2022 Stage prefix: Initial diagnosis Histologic grading system: 3 grade system   06/12/2022 Genetic Testing   Negative hereditary cancer genetic testing: no pathogenic variants detected in [facility] BRCAPlus Panel.  Report date is June 12, 2022.   The BRCAplus panel offered by [employer] and includes sequencing and deletion/duplication analysis for the following 8 genes: ATM, BRCA1, BRCA2, CDH1, CHEK2, PALB2, PTEN, and TP53.  Pan-cancer panel is pending.    07/10/2022 -  Anti-estrogen oral therapy   2.5 mg Letrozole x 5 years   07/31/2022 - 08/22/2022 Radiation Therapy   Site Technique Total Dose (Gy) Dose per Fx (Gy) Completed Fx Beam Energies  Breast, Left: Breast_L_axilla 3D 40.05/40.05 2.67 15/15 15X, 10XFFF       INTERVAL HISTORY:  Jackie Montoya to review her survivorship care plan detailing her treatment course for breast cancer, as well as monitoring long-term side effects of that treatment, education regarding health maintenance, screening, and overall wellness and health promotion.     Overall, Jackie Montoya reports feeling quite well.  She is taking Letrozole daily.  She has been feeling more anxious on the medication.  She is already taking prozac and wellbutrin.  She started the letrozole in September.    REVIEW OF SYSTEMS:  Review of Systems  Constitutional:  Negative for appetite change, chills, fatigue, fever and unexpected weight change.  HENT:   Negative for hearing loss, lump/mass and trouble swallowing.   Eyes:   Negative for eye problems and icterus.  Respiratory:  Negative for chest tightness, cough and shortness of breath.   Cardiovascular:  Negative for chest pain, leg swelling and palpitations.  Gastrointestinal:  Negative for abdominal distention, abdominal pain, constipation, diarrhea, nausea and vomiting.  Endocrine: Negative for hot flashes.  Genitourinary:  Negative for difficulty urinating.   Musculoskeletal:  Negative for arthralgias.  Skin:  Negative for itching and rash.  Neurological:  Negative for dizziness, extremity weakness, headaches and numbness.  Hematological:  Negative for adenopathy. Does not bruise/bleed easily.  Psychiatric/Behavioral:  Negative for depression. The patient is not nervous/anxious.    Breast: Denies any new nodularity, masses, tenderness, nipple changes, or nipple discharge.      ONCOLOGY TREATMENT TEAM:  1. Surgeon:  Dr. [name] [facility] at [facility] 2. Medical Oncologist: Dr. Lindi Adie  3. Radiation Oncologist: Dr. 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  Shortness of breath    Sleep apnea    Vitamin D deficiency    Past Surgical History:  Procedure Laterality Date   BREAST BIOPSY Left 2020   benign   BREAST BIOPSY Left 05/28/2022   BREAST LUMPECTOMY WITH RADIOACTIVE SEED LOCALIZATION Left 06/27/2022   Procedure: LEFT BREAST RADIOACTIVE SEED LOCALIZED LUMPECTOMY;  Surgeon: Donnie Mesa, MD;  Location: [facility];  Service: General;  Laterality: Left;  LMA   CATARACT EXTRACTION     NASAL SINUS SURGERY     PELVIC LAPAROSCOPY  1996   fibroids   TONSILLECTOMY AND ADENOIDECTOMY  age 71   TUBAL LIGATION Bilateral 1977     ALLERGIES:  Allergies  Allergen Reactions   Codeine Nausea And Vomiting     CURRENT MEDICATIONS:  Outpatient Encounter Medications as of 11/11/2022  Medication Sig   atorvastatin (LIPITOR) 20 MG tablet Take 20 mg by mouth daily.   buPROPion (WELLBUTRIN XL) 300 MG 24 hr tablet Take 300 mg by mouth daily.   CALCIUM PO Take by mouth daily.    Cholecalciferol (VITAMIN D) 125 MCG (5000 UT) CAPS Take 1 capsule by mouth daily.   FLUoxetine (PROZAC) 40 MG capsule Take 40 mg by mouth daily.   fluticasone (FLONASE) 50 MCG/ACT nasal spray Place into both nostrils daily.   hydrochlorothiazide (HYDRODIURIL) 25 MG tablet Take 25 mg by mouth daily.   letrozole (FEMARA) 2.5 MG tablet Take 1 tablet (2.5 mg total) by mouth daily.   loratadine (CLARITIN) 10 MG tablet Take 10 mg by mouth daily.   losartan (COZAAR) 25 MG tablet Take 25 mg by mouth daily.   omeprazole (PRILOSEC) 20 MG capsule Take 20 mg by mouth daily.   Probiotic Product (PROBIOTIC DAILY PO) Take 2 capsules by mouth daily. Emma Probiotic   Semaglutide, 2 MG/DOSE, 8 MG/3ML SOPN Inject 2 mg as directed once a week.   Turmeric (QC TUMERIC COMPLEX) 500 MG CAPS Take 1 capsule by mouth daily.   albuterol (VENTOLIN HFA) 108 (90 Base) MCG/ACT inhaler INHALE 2 PUFFS BY MOUTH EVERY 4 HOURS AS NEEDED FOR WHEEZING AND COUGH (Patient not taking: Reported on 11/11/2022)   amLODipine (NORVASC) 5 MG tablet Take 5 mg by mouth daily. (Patient not taking: Reported on 11/11/2022)   No facility-administered encounter medications on file as of 11/11/2022.     ONCOLOGIC FAMILY HISTORY:  Family History  Problem Relation Age of Onset   Alzheimer's disease Mother    Hypertension Mother    High Cholesterol Mother    Anxiety  disorder Mother    Alcohol abuse Father    Drug abuse Sister        overdose   Lung cancer Sister 32   Colon cancer Sister 76       colectomy   Lung cancer Paternal Uncle        smoking hx   Colon cancer Paternal Grandmother        ?   Lung cancer Cousin        smoking hx; maternal female cousin   Breast cancer Cousin        dx before 27; paternal female cousin    SOCIAL HISTORY:  Social History   Socioeconomic History   Marital status: Married    Spouse name: steve   Number of children: 2   Years of education: Not on file   Highest education level: Not on file  Occupational History   Occupation: retired    Fish farm manager: Warehouse manager  Tobacco Use   Smoking status: Former    Years: 15.00    Types: Cigarettes    Passive exposure: Never   Smokeless tobacco: Never  Vaping Use   Vaping Use: Never used  Substance and Sexual Activity   Alcohol use: Yes    Alcohol/week: 4.0 - 5.0 standard drinks of alcohol    Types: 4 - 5 Glasses of wine per week    Comment: rare   Drug use: No   Sexual activity: Yes    Partners: Male    Birth control/protection: Post-menopausal  Other Topics Concern   Not on file  Social History Narrative   Not on file   Social Determinants of Health   Financial Resource Strain: Not on file  Food Insecurity: Not on file  Transportation Needs: Not on file  Physical Activity: Not on file  Stress: Not on file  Social Connections: Not on file  Intimate Partner Violence: Not on file     OBSERVATIONS/OBJECTIVE:  BP (!) 147/69 (BP Location: Left Arm, Patient Position: Sitting)   Pulse 83   Temp 98.1 F (36.7 C) (Temporal)   Resp 16   Ht _0  (1.651 m)   Wt 245 lb 11.2 oz (111.4 kg)   LMP 06/15/2002   SpO2 96%   BMI 40.89 kg/m  GENERAL: Patient is a well appearing female in no acute distress HEENT:  Sclerae anicteric.  Oropharynx clear and moist. No ulcerations or evidence of oropharyngeal candidiasis. Neck is supple.  NODES:  No cervical,  supraclavicular, or axillary lymphadenopathy palpated.  BREAST EXAM:  left breast s/p lumpectomy and radiation, no sign of local recurrence right breast benign LUNGS:  Clear to auscultation bilaterally.  No wheezes or rhonchi. HEART:  Regular rate and rhythm. No murmur appreciated. ABDOMEN:  Soft, nontender.  Positive, normoactive bowel sounds. No organomegaly palpated. MSK:  No focal spinal tenderness to palpation. Full range of motion bilaterally in the upper extremities. EXTREMITIES:  No peripheral edema.   SKIN:  Clear with no obvious rashes or skin changes. No nail dyscrasia. NEURO:  Nonfocal. Well oriented.  Appropriate affect.  LABORATORY DATA:  None for this visit.  DIAGNOSTIC IMAGING:  None for this visit.      ASSESSMENT AND PLAN:  Ms.. Montoya is a pleasant 71 y.o. female with Stage 1A left breast invasive ductal carcinoma, ER+/PR+/HER2-, diagnosed in June 2023, treated with lumpectomy, adjuvant radiation therapy, and anti-estrogen therapy with letrozole beginning in October 2023.  She presents to the Survivorship Clinic for our initial meeting and routine follow-up post-completion of treatment for breast cancer.    1. Stage 1A left breast cancer:  Jackie Montoya is continuing to recover from definitive treatment for breast cancer. She will follow-up with her medical oncologist, Dr. Lindi Adie in 6 months with history and physical exam per surveillance protocol.  She will continue her anti-estrogen therapy with letrozole. Thus far, she is tolerating the letrozole well, with minimal side effects. She was instructed to make Dr. Lindi Adie or myself aware if she begins to experience any worsening side effects of the medication and I could see her back in clinic to help manage those side effects, as needed. Her mammogram is due May 2024; orders placed today.   Today, a comprehensive survivorship care plan and treatment summary was reviewed with the patient today detailing her breast cancer  diagnosis, treatment course, potential late/long-term effects of treatment, appropriate follow-up care with recommendations for the future, and patient education resources.  A copy of this  summary, along with a letter will be sent to the patient's primary care provider via mail/fax/In Basket message after today's visit.    2. Bone health:  Given Jackie Montoya's age/history of breast cancer and her current treatment regimen including anti-estrogen therapy with letrozole, she is at risk for bone demineralization.  Her last DEXA scan occurred in May 2022 and was normal.  I recommended that she repeat in May 2024.  In the meantime, she was encouraged to increase her consumption of foods rich in calcium, as well as increase her weight-bearing activities.  She was given education on specific activities to promote bone health.  3. Cancer screening:  Due to Jackie Montoya's history and her age, she should receive screening for skin cancers, colon cancer, and gynecologic cancers.  The information and recommendations are listed on the patient's comprehensive care plan/treatment summary and were reviewed in detail with the patient.    4. Health maintenance and wellness promotion: Jackie Montoya was encouraged to consume 5-7 servings of fruits and vegetables per day. We reviewed the "Nutrition Rainbow" handout.  She was also encouraged to engage in moderate to vigorous exercise for 30 minutes per day most days of the week. We discussed the [employer] fitness program, which is designed for cancer survivors to help them become more physically fit after cancer treatments.  She was instructed to limit her alcohol consumption and continue to abstain from tobacco use.     5. Support services/counseling: It is not uncommon for this period of the patient's cancer care trajectory to be one of many emotions and stressors. She was given information regarding our available services and encouraged to contact me with any questions or for  help enrolling in any of our support group/programs.    Follow up instructions:    -Return to cancer center in 6 months for follow-up with Dr. Lindi Adie -Mammogram due in May 2024 -Bone density due in May 2024 -She is welcome to return back to the Survivorship Clinic at any time; no additional follow-up needed at this time.  -Consider referral back to survivorship as a long-term survivor for continued surveillance  The patient was provided an opportunity to ask questions and all were answered. The patient agreed with the plan and demonstrated an understanding of the instructions.   Total encounter time:40 minutes*in face-to-face visit time, chart review, lab review, care coordination, order entry, and documentation of the encounter time.    Wilber Bihari, NP 11/11/22 2:01 PM Medical Oncology and Hematology [facility] [place], [place] [address] Tel. [phone]    Fax. [phone]  *Total Encounter Time as defined by the Centers for Medicare and Medicaid Services includes, in addition to the face-to-face time of a patient visit (documented in the note above) non-face-to-face time: obtaining and reviewing outside history, ordering and reviewing medications, tests or procedures, care coordination (communications with other health care professionals or caregivers) and documentation in the medical record.

## 2022-11-12 DIAGNOSIS — M5442 Lumbago with sciatica, left side: Secondary | ICD-10-CM | POA: Diagnosis not present

## 2022-11-18 ENCOUNTER — Encounter: Payer: Self-pay | Admitting: Bariatrics

## 2022-11-18 NOTE — Progress Notes (Signed)
Chief Complaint:   OBESITY Jackie Montoya is here to discuss her progress with her obesity treatment plan along with follow-up of her obesity related diagnoses. Jackie Montoya is on keeping a food journal and adhering to recommended goals of 1300 calories and 80-100 grams of protein and states she is following her eating plan approximately 75% of the time. Jackie Montoya states she is doing physical therapy for 60 minutes 5 times per week.  Today's visit was #: 18 Starting weight: 287 lbs Starting date: 12/22/2019 Today's weight: 233 lbs Today's date: 11/06/2022 Total lbs lost to date: 54 Total lbs lost since last in-office visit: 3  Interim History: Jackie Montoya is down 3 pounds since her last visit.  She is doing better with her fiber and protein intake.  Subjective:   1. IFG (impaired fasting glucose) Jackie Montoya is taking Ozempic, and she notes decrease in hunger.  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   Assessment/Plan:   1. IFG (impaired fasting glucose) Jackie Montoya will continue Ozempic 2 mg once weekly, and we will refill for 90 days.  - Semaglutide, 2 MG/DOSE, 8 MG/3ML SOPN; Inject 2 mg as directed once a week.  Dispense: 9 mL; Refill: 0  2. Other disorder of eating Jackie Montoya agreed to discontinue Topamax.  We will follow-up at her next visit.  3. Obesity: Current BMI 38.3 Jackie Montoya is currently in the action stage of change. As such, her goal is to continue with weight loss efforts. She has agreed to keeping a food journal and adhering to recommended goals of 1300 calories and 80-100 grams of protein.   Meal planning and intentional eating were discussed.  Exercise goals: As is.   Behavioral modification strategies: increasing lean protein intake, decreasing simple carbohydrates, increasing vegetables, increasing water intake, decreasing eating out, no skipping meals, meal planning and cooking strategies, keeping healthy  foods in the home, and planning for success.  Jackie Montoya has agreed to follow-up with our clinic in 4 weeks. She was informed of the importance of frequent follow-up visits to maximize her success with intensive lifestyle modifications for her multiple health conditions.   Objective:   Blood pressure 128/71, pulse 80, temperature 98 F (36.7 C), height '5\' 5"'$  (1.651 m), weight 233 lb (105.7 kg), last menstrual period 06/15/2002, SpO2 95 %. Body mass index is 38.77 kg/m.  General: Cooperative, alert, well developed, in no acute distress. HEENT: Conjunctivae and lids unremarkable. Cardiovascular: Regular rhythm.  Lungs: Normal work of breathing. Neurologic: No focal deficits.   Lab Results  Component Value Date   CREATININE 0.76 06/05/2022   BUN 19 06/05/2022   NA 137 06/05/2022   K 3.9 06/05/2022   CL 102 06/05/2022   CO2 29 06/05/2022   Lab Results  Component Value Date   ALT 15 06/05/2022   AST 17 06/05/2022   ALKPHOS 80 06/05/2022   BILITOT 0.6 06/05/2022   Lab Results  Component Value Date   HGBA1C 5.1 07/11/2022   HGBA1C 5.3 01/22/2022   HGBA1C 5.3 07/11/2021   HGBA1C 5.5 11/02/2020   HGBA1C 5.5 04/27/2020   Lab Results  Component Value Date   INSULIN 10.8 07/11/2022   INSULIN 11.6 01/22/2022   INSULIN 7.3 07/11/2021   INSULIN 10.2 11/02/2020   INSULIN 9.2 04/27/2020   Lab Results  Component Value Date   TSH 1.870 12/22/2019   Lab Results  Component Value Date   CHOL 181 01/22/2022  HDL 73 01/22/2022   LDLCALC 89 01/22/2022   TRIG 109 01/22/2022   Lab Results  Component Value Date   VD25OH 62.1 07/11/2022   VD25OH 57.5 01/22/2022   VD25OH 64.5 07/11/2021   Lab Results  Component Value Date   WBC 7.5 06/05/2022   HGB 14.1 06/05/2022   HCT 41.4 06/05/2022   MCV 88.5 06/05/2022   PLT 248 06/05/2022   No results found for: "IRON", "TIBC", "FERRITIN"  Attestation Statements:   Reviewed by clinician on day of visit: allergies, medications,  problem list, medical history, surgical history, family history, social history, and previous encounter notes.   [name], am acting as Location manager for [employer], DO.  I have reviewed the above documentation for accuracy and completeness, and I agree with the above. Jearld Lesch, DO

## 2022-11-19 ENCOUNTER — Other Ambulatory Visit: Payer: Self-pay | Admitting: Hematology and Oncology

## 2022-11-20 DIAGNOSIS — M5442 Lumbago with sciatica, left side: Secondary | ICD-10-CM | POA: Diagnosis not present

## 2022-11-27 DIAGNOSIS — Z961 Presence of intraocular lens: Secondary | ICD-10-CM | POA: Diagnosis not present

## 2022-11-27 DIAGNOSIS — H353131 Nonexudative age-related macular degeneration, bilateral, early dry stage: Secondary | ICD-10-CM | POA: Diagnosis not present

## 2022-11-28 DIAGNOSIS — M5442 Lumbago with sciatica, left side: Secondary | ICD-10-CM | POA: Diagnosis not present

## 2022-12-03 DIAGNOSIS — M5442 Lumbago with sciatica, left side: Secondary | ICD-10-CM | POA: Diagnosis not present

## 2022-12-04 ENCOUNTER — Encounter: Payer: Self-pay | Admitting: Bariatrics

## 2022-12-04 ENCOUNTER — Ambulatory Visit (INDEPENDENT_AMBULATORY_CARE_PROVIDER_SITE_OTHER): Payer: Medicare Other | Admitting: Bariatrics

## 2022-12-04 VITALS — BP 145/79 | HR 78 | Temp 98.5°F | Ht 65.0 in | Wt 238.0 lb

## 2022-12-04 DIAGNOSIS — F5089 Other specified eating disorder: Secondary | ICD-10-CM

## 2022-12-04 DIAGNOSIS — E559 Vitamin D deficiency, unspecified: Secondary | ICD-10-CM

## 2022-12-04 DIAGNOSIS — R7301 Impaired fasting glucose: Secondary | ICD-10-CM

## 2022-12-04 DIAGNOSIS — E88819 Insulin resistance, unspecified: Secondary | ICD-10-CM

## 2022-12-04 DIAGNOSIS — Z6839 Body mass index (BMI) 39.0-39.9, adult: Secondary | ICD-10-CM | POA: Diagnosis not present

## 2022-12-04 DIAGNOSIS — E669 Obesity, unspecified: Secondary | ICD-10-CM

## 2022-12-20 ENCOUNTER — Other Ambulatory Visit: Payer: Self-pay | Admitting: *Deleted

## 2022-12-20 MED ORDER — LETROZOLE 2.5 MG PO TABS: ORAL_TABLET | ORAL | 3 refills | Status: DC

## 2022-12-23 NOTE — Patient Instructions (Signed)

## 2022-12-24 ENCOUNTER — Other Ambulatory Visit: Payer: Self-pay | Admitting: *Deleted

## 2022-12-24 ENCOUNTER — Encounter: Payer: Self-pay | Admitting: Bariatrics

## 2022-12-24 DIAGNOSIS — M5442 Lumbago with sciatica, left side: Secondary | ICD-10-CM | POA: Diagnosis not present

## 2022-12-24 MED ORDER — LETROZOLE 2.5 MG PO TABS: ORAL_TABLET | ORAL | 3 refills | Status: DC

## 2022-12-24 NOTE — Progress Notes (Signed)
Chief Complaint:   OBESITY Jackie Montoya is here to discuss her progress with her obesity treatment plan along with follow-up of her obesity related diagnoses. Jackie Montoya is on keeping a food journal and adhering to recommended goals of 1300 calories and 80-100 grams of protein and states she is following her eating plan approximately 50% of the time. Jackie Montoya states she is riding the stationary bike for 10-15 minutes 5-6 times per week.  Today's visit was #: 71 Starting weight: 287 lbs Starting date: 12/22/2019 Today's weight: 238 lbs Today's date: 12/04/2022 Total lbs lost to date: 49 Total lbs lost since last in-office visit: 0  Interim History: Jackie Montoya is up 5 pounds since her last visit, but she has done well overall.  Subjective:   1. IFG (impaired fasting glucose) Jackie Montoya is taking Ozempic and she notes decreased appetite.  2. Vitamin D deficiency Jackie Montoya is currently taking vitamin D.  3. Insulin resistance Jackie Montoya is following a lower carbohydrate plan.  4. Other disorder of eating Jackie Montoya is currently taking Prozac and Wellbutrin.  Assessment/Plan:   1. IFG (impaired fasting glucose) Jackie Montoya will continue Ozempic as directed.  2. Vitamin D deficiency Jackie Montoya will continue vitamin D, and she will try to get some limited sun exposure.  3. Insulin resistance Jackie Montoya will continue to decrease her carbohydrates.  4. Other disorder of eating Jackie Montoya will continue her medications, and she will resume the Topamax.  5. Obesity: Current BMI 39.6 Jackie Montoya is currently in the action stage of change. As such, her goal is to continue with weight loss efforts. She has agreed to keeping a food journal and adhering to recommended goals of 1300 calories and 80-100 grams of protein.   Mindful eating was discussed.  She will adhere to the plan 80-90% and we will get back on track.  She will increase her protein especially in the morning.  Exercise goals: As is, PT 15-20 minutes. .   Behavioral modification  strategies: increasing lean protein intake, decreasing simple carbohydrates, increasing vegetables, increasing water intake, decreasing eating out, no skipping meals, meal planning and cooking strategies, keeping healthy foods in the home, and planning for success.  Jackie Montoya has agreed to follow-up with our clinic in 4 weeks. She was informed of the importance of frequent follow-up visits to maximize her success with intensive lifestyle modifications for her multiple health conditions.   Objective:   Blood pressure (!) 145/79, pulse 78, temperature 98.5 F (36.9 C), height '5\' 5"'$  (1.651 m), weight 238 lb (108 kg), last menstrual period 06/15/2002, SpO2 96 %. Body mass index is 39.61 kg/m.  General: Cooperative, alert, well developed, in no acute distress. HEENT: Conjunctivae and lids unremarkable. Cardiovascular: Regular rhythm.  Lungs: Normal work of breathing. Neurologic: No focal deficits.   Lab Results  Component Value Date   CREATININE 0.76 06/05/2022   BUN 19 06/05/2022   NA 137 06/05/2022   K 3.9 06/05/2022   CL 102 06/05/2022   CO2 29 06/05/2022   Lab Results  Component Value Date   ALT 15 06/05/2022   AST 17 06/05/2022   ALKPHOS 80 06/05/2022   BILITOT 0.6 06/05/2022   Lab Results  Component Value Date   HGBA1C 5.1 07/11/2022   HGBA1C 5.3 01/22/2022   HGBA1C 5.3 07/11/2021   HGBA1C 5.5 11/02/2020   HGBA1C 5.5 04/27/2020   Lab Results  Component Value Date   INSULIN 10.8 07/11/2022   INSULIN 11.6 01/22/2022   INSULIN 7.3 07/11/2021   INSULIN 10.2 11/02/2020  INSULIN 9.2 04/27/2020   Lab Results  Component Value Date   TSH 1.870 12/22/2019   Lab Results  Component Value Date   CHOL 181 01/22/2022   HDL 73 01/22/2022   LDLCALC 89 01/22/2022   TRIG 109 01/22/2022   Lab Results  Component Value Date   VD25OH 62.1 07/11/2022   VD25OH 57.5 01/22/2022   VD25OH 64.5 07/11/2021   Lab Results  Component Value Date   WBC 7.5 06/05/2022   HGB 14.1  06/05/2022   HCT 41.4 06/05/2022   MCV 88.5 06/05/2022   PLT 248 06/05/2022   No results found for: "IRON", "TIBC", "FERRITIN"  Attestation Statements:   Reviewed by clinician on day of visit: allergies, medications, problem list, medical history, surgical history, family history, social history, and previous encounter notes.   [name], am acting as Location manager for [employer], DO.  I have reviewed the above documentation for accuracy and completeness, and I agree with the above. Jearld Lesch, DO

## 2023-01-01 ENCOUNTER — Encounter: Payer: Self-pay | Admitting: Bariatrics

## 2023-01-01 ENCOUNTER — Ambulatory Visit (INDEPENDENT_AMBULATORY_CARE_PROVIDER_SITE_OTHER): Payer: Medicare Other | Admitting: Bariatrics

## 2023-01-01 VITALS — BP 131/69 | HR 82 | Temp 98.6°F | Ht 65.0 in | Wt 238.0 lb

## 2023-01-01 DIAGNOSIS — Z6839 Body mass index (BMI) 39.0-39.9, adult: Secondary | ICD-10-CM

## 2023-01-01 DIAGNOSIS — E88819 Insulin resistance, unspecified: Secondary | ICD-10-CM

## 2023-01-01 DIAGNOSIS — R7301 Impaired fasting glucose: Secondary | ICD-10-CM

## 2023-01-01 DIAGNOSIS — R632 Polyphagia: Secondary | ICD-10-CM | POA: Insufficient documentation

## 2023-01-01 DIAGNOSIS — E669 Obesity, unspecified: Secondary | ICD-10-CM | POA: Diagnosis not present

## 2023-01-01 MED ORDER — SEMAGLUTIDE (2 MG/DOSE) 8 MG/3ML ~~LOC~~ SOPN: 2.0000 mg | PEN_INJECTOR | SUBCUTANEOUS | 0 refills | Status: DC

## 2023-01-03 DIAGNOSIS — M5442 Lumbago with sciatica, left side: Secondary | ICD-10-CM | POA: Diagnosis not present

## 2023-01-09 ENCOUNTER — Encounter: Payer: Self-pay | Admitting: Bariatrics

## 2023-01-09 NOTE — Progress Notes (Signed)
Chief Complaint:   OBESITY Jackie Montoya is here to discuss her progress with her obesity treatment plan along with follow-up of her obesity related diagnoses. Jackie Montoya is on keeping a food journal and adhering to recommended goals of 1300 calories and 80-100 grams protein daily and states she is following her eating plan approximately 75% of the time. Jackie Montoya states she is going to physical therapy and riding her bike 20-45 minutes 1-7 times per week.  Today's visit was #: 71 Starting weight: 287 lbs Starting date: 12/22/2019 Today's weight: 238 lbs Today's date: 01/01/23 Total lbs lost to date: 49 Total lbs lost since last in-office visit: 0  Interim History: Her weight remains the same over the holidays.  Her goal is to get below 200 pounds.  Subjective:   1. IFG (impaired fasting glucose) Taking Ozempic as directed.  2. Polyphagia Taking Ozempic.  3. Insulin resistance Impaired fasting glucose.  Taking semaglutide.  Assessment/Plan:   1. IFG (impaired fasting glucose) Refill: - Semaglutide, 2 MG/DOSE, 8 MG/3ML SOPN; Inject 2 mg as directed once a week.  Dispense: 9 mL; Refill: 0  2. Polyphagia Continue Ozempic. - Semaglutide, 2 MG/DOSE, 8 MG/3ML SOPN; Inject 2 mg as directed once a week.  Dispense: 9 mL; Refill: 0  3. Insulin resistance Continue semaglutide. - Semaglutide, 2 MG/DOSE, 8 MG/3ML SOPN; Inject 2 mg as directed once a week.  Dispense: 9 mL; Refill: 0  4. Obesity: Current BMI 39.7 1.  Meal planning 2.  Intentional eating 3.  Will get back on track 4.  Increase water 5.  Decrease carbohydrates, will keep good snacks.  Jackie Montoya is currently in the action stage of change. As such, her goal is to continue with weight loss efforts. She has agreed to keeping a food journal and adhering to recommended goals of 1300 calories and 80-100 gms protein daily.  Exercise goals: More exercise, riding her bike and physical therapy, 45 minutes.  Behavioral modification strategies:  increasing lean protein intake, decreasing simple carbohydrates, increasing vegetables, increasing water intake, decreasing eating out, no skipping meals, meal planning and cooking strategies, keeping healthy foods in the home, and planning for success.  Jackie Montoya has agreed to follow-up with our clinic in 2 weeks. She was informed of the importance of frequent follow-up visits to maximize her success with intensive lifestyle modifications for her multiple health conditions.    Objective:   Blood pressure 131/69, pulse 82, temperature 98.6 F (37 C), height '5\' 5"'$  (1.651 m), weight 238 lb (108 kg), last menstrual period 06/15/2002, SpO2 97 %. Body mass index is 39.61 kg/m.  General: Cooperative, alert, well developed, in no acute distress. HEENT: Conjunctivae and lids unremarkable. Cardiovascular: Regular rhythm.  Lungs: Normal work of breathing. Neurologic: No focal deficits.   Lab Results  Component Value Date   CREATININE 0.76 06/05/2022   BUN 19 06/05/2022   NA 137 06/05/2022   K 3.9 06/05/2022   CL 102 06/05/2022   CO2 29 06/05/2022   Lab Results  Component Value Date   ALT 15 06/05/2022   AST 17 06/05/2022   ALKPHOS 80 06/05/2022   BILITOT 0.6 06/05/2022   Lab Results  Component Value Date   HGBA1C 5.1 07/11/2022   HGBA1C 5.3 01/22/2022   HGBA1C 5.3 07/11/2021   HGBA1C 5.5 11/02/2020   HGBA1C 5.5 04/27/2020   Lab Results  Component Value Date   INSULIN 10.8 07/11/2022   INSULIN 11.6 01/22/2022   INSULIN 7.3 07/11/2021   INSULIN 10.2 11/02/2020  INSULIN 9.2 04/27/2020   Lab Results  Component Value Date   TSH 1.870 12/22/2019   Lab Results  Component Value Date   CHOL 181 01/22/2022   HDL 73 01/22/2022   LDLCALC 89 01/22/2022   TRIG 109 01/22/2022   Lab Results  Component Value Date   VD25OH 62.1 07/11/2022   VD25OH 57.5 01/22/2022   VD25OH 64.5 07/11/2021   Lab Results  Component Value Date   WBC 7.5 06/05/2022   HGB 14.1 06/05/2022   HCT 41.4  06/05/2022   MCV 88.5 06/05/2022   PLT 248 06/05/2022   No results found for: "IRON", "TIBC", "FERRITIN"   Attestation Statements:   Reviewed by clinician on day of visit: allergies, medications, problem list, medical history, surgical history, family history, social history, and previous encounter notes.  I, Dawn Whitmire, FNP-C, am acting as transcriptionist for Dr. Jearld Lesch.  I have reviewed the above documentation for accuracy and completeness, and I agree with the above. Jearld Lesch, DO

## 2023-01-13 ENCOUNTER — Encounter: Payer: Self-pay | Admitting: Bariatrics

## 2023-01-14 DIAGNOSIS — M5442 Lumbago with sciatica, left side: Secondary | ICD-10-CM | POA: Diagnosis not present

## 2023-01-21 DIAGNOSIS — M5442 Lumbago with sciatica, left side: Secondary | ICD-10-CM | POA: Diagnosis not present

## 2023-01-27 DIAGNOSIS — C50912 Malignant neoplasm of unspecified site of left female breast: Secondary | ICD-10-CM | POA: Diagnosis not present

## 2023-01-29 ENCOUNTER — Encounter: Payer: Self-pay | Admitting: Bariatrics

## 2023-01-29 ENCOUNTER — Ambulatory Visit (INDEPENDENT_AMBULATORY_CARE_PROVIDER_SITE_OTHER): Payer: Medicare Other | Admitting: Bariatrics

## 2023-01-29 VITALS — BP 115/69 | HR 82 | Temp 98.0°F | Ht 65.0 in | Wt 238.0 lb

## 2023-01-29 DIAGNOSIS — R632 Polyphagia: Secondary | ICD-10-CM

## 2023-01-29 DIAGNOSIS — R7301 Impaired fasting glucose: Secondary | ICD-10-CM | POA: Diagnosis not present

## 2023-01-29 DIAGNOSIS — G4733 Obstructive sleep apnea (adult) (pediatric): Secondary | ICD-10-CM

## 2023-01-29 DIAGNOSIS — Z6839 Body mass index (BMI) 39.0-39.9, adult: Secondary | ICD-10-CM

## 2023-01-29 MED ORDER — WEGOVY 0.25 MG/0.5ML ~~LOC~~ SOAJ: 0.2500 mg | SUBCUTANEOUS | 0 refills | Status: DC

## 2023-02-03 DIAGNOSIS — M5442 Lumbago with sciatica, left side: Secondary | ICD-10-CM | POA: Diagnosis not present

## 2023-02-04 ENCOUNTER — Other Ambulatory Visit (INDEPENDENT_AMBULATORY_CARE_PROVIDER_SITE_OTHER): Payer: Self-pay | Admitting: Bariatrics

## 2023-02-04 DIAGNOSIS — R7301 Impaired fasting glucose: Secondary | ICD-10-CM

## 2023-02-04 DIAGNOSIS — R632 Polyphagia: Secondary | ICD-10-CM

## 2023-02-04 MED ORDER — WEGOVY 0.25 MG/0.5ML ~~LOC~~ SOAJ: 0.2500 mg | SUBCUTANEOUS | 0 refills | Status: DC

## 2023-02-05 ENCOUNTER — Other Ambulatory Visit (INDEPENDENT_AMBULATORY_CARE_PROVIDER_SITE_OTHER): Payer: Self-pay | Admitting: Bariatrics

## 2023-02-05 DIAGNOSIS — R7301 Impaired fasting glucose: Secondary | ICD-10-CM

## 2023-02-05 DIAGNOSIS — R632 Polyphagia: Secondary | ICD-10-CM

## 2023-02-05 MED ORDER — WEGOVY 0.25 MG/0.5ML ~~LOC~~ SOAJ: 0.2500 mg | SUBCUTANEOUS | 0 refills | Status: DC

## 2023-02-10 ENCOUNTER — Telehealth: Payer: Self-pay

## 2023-02-10 NOTE — Telephone Encounter (Signed)
Started PA for Wegovy 0.25 mg for patient.

## 2023-02-12 NOTE — Progress Notes (Signed)
Chief Complaint:   OBESITY Jackie Montoya is here to discuss her progress with her obesity treatment plan along with follow-up of her obesity related diagnoses. Jackie Montoya is on keeping a food journal and adhering to recommended goals of 1300 calories and 80-100 grams protein and states she is following her eating plan approximately 70% of the time. Jackie Montoya states she is biking and physical therapy 60 minutes 1-7 times per week.  Today's visit was #: 85 Starting weight: 287 lbs Starting date: 12/22/2019 Today's weight: 238 lbs Today's date: 01/29/2023 Total lbs lost to date: 49 Total lbs lost since last in-office visit: 0  Interim History: Jackie Montoya's weight has stayed the same since her last visit. She has made changes but states that she needs to do more.  Subjective:   1. OSA (obstructive sleep apnea) Jackie Montoya is sleeping 5-7 hours per night.  2. [name] has taken [place] but stopped due to cost.  3. IFG (impaired fasting glucose) She is cutting carbohydrates.  Assessment/Plan:   1. OSA (obstructive sleep apnea) Continue to wear CPAP. Handout on Sleep Hygiene Will move the Femara to earlier in the day, so that it does not interfere with sleep.  2. Polyphagia Start Wegovy 0.25 mg weekly.  3. IFG (impaired fasting glucose) Minimize all carbohydrates (sweets and starches).  4. Morbid obesity ([place]) 5. BMI 39.0-39.9,adult Decrease oils and fats. Decrease bread and switch to Keto brand. Will keep water intake high.  Jackie Montoya is currently in the action stage of change. As such, her goal is to continue with weight loss efforts. She has agreed to keeping a food journal and adhering to recommended goals of 1300 calories and 80-100 grams protein.   Exercise goals:  As is  Behavioral modification strategies: increasing lean protein intake, decreasing simple carbohydrates, increasing vegetables, increasing water intake, decreasing eating out, no skipping meals, meal planning and cooking  strategies, keeping healthy foods in the home, celebration eating strategies, avoiding temptations, planning for success, and keeping a strict food journal.  Jackie Montoya has agreed to follow-up with our clinic in 4 weeks. She was informed of the importance of frequent follow-up visits to maximize her success with intensive lifestyle modifications for her multiple health conditions.   Objective:   Blood pressure 115/69, pulse 82, temperature 98 F (36.7 C), height '5\' 5"'$  (1.651 m), weight 238 lb (108 kg), last menstrual period 06/15/2002, SpO2 97 %. Body mass index is 39.61 kg/m.  General: Cooperative, alert, well developed, in no acute distress. HEENT: Conjunctivae and lids unremarkable. Cardiovascular: Regular rhythm.  Lungs: Normal work of breathing. Neurologic: No focal deficits.   Lab Results  Component Value Date   CREATININE 0.76 06/05/2022   BUN 19 06/05/2022   NA 137 06/05/2022   K 3.9 06/05/2022   CL 102 06/05/2022   CO2 29 06/05/2022   Lab Results  Component Value Date   ALT 15 06/05/2022   AST 17 06/05/2022   ALKPHOS 80 06/05/2022   BILITOT 0.6 06/05/2022   Lab Results  Component Value Date   HGBA1C 5.1 07/11/2022   HGBA1C 5.3 01/22/2022   HGBA1C 5.3 07/11/2021   HGBA1C 5.5 11/02/2020   HGBA1C 5.5 04/27/2020   Lab Results  Component Value Date   INSULIN 10.8 07/11/2022   INSULIN 11.6 01/22/2022   INSULIN 7.3 07/11/2021   INSULIN 10.2 11/02/2020   INSULIN 9.2 04/27/2020   Lab Results  Component Value Date   TSH 1.870 12/22/2019   Lab Results  Component Value Date  CHOL 181 01/22/2022   HDL 73 01/22/2022   LDLCALC 89 01/22/2022   TRIG 109 01/22/2022   Lab Results  Component Value Date   VD25OH 62.1 07/11/2022   VD25OH 57.5 01/22/2022   VD25OH 64.5 07/11/2021   Lab Results  Component Value Date   WBC 7.5 06/05/2022   HGB 14.1 06/05/2022   HCT 41.4 06/05/2022   MCV 88.5 06/05/2022   PLT 248 06/05/2022   Attestation Statements:   Reviewed by  clinician on day of visit: allergies, medications, problem list, medical history, surgical history, family history, social history, and previous encounter notes.  I, Kathlene November, BS, CMA, am acting as transcriptionist for [employer], DO.  I have reviewed the above documentation for accuracy and completeness, and I agree with the above. Jearld Lesch, DO

## 2023-02-17 ENCOUNTER — Encounter: Payer: Self-pay | Admitting: Bariatrics

## 2023-02-18 DIAGNOSIS — M5442 Lumbago with sciatica, left side: Secondary | ICD-10-CM | POA: Diagnosis not present

## 2023-02-25 DIAGNOSIS — M5442 Lumbago with sciatica, left side: Secondary | ICD-10-CM | POA: Diagnosis not present

## 2023-02-26 ENCOUNTER — Ambulatory Visit (INDEPENDENT_AMBULATORY_CARE_PROVIDER_SITE_OTHER): Payer: Medicare Other | Admitting: Bariatrics

## 2023-02-26 ENCOUNTER — Encounter: Payer: Self-pay | Admitting: Bariatrics

## 2023-02-26 VITALS — BP 112/60 | HR 73 | Temp 97.6°F | Ht 65.0 in | Wt 241.0 lb

## 2023-02-26 DIAGNOSIS — Z6839 Body mass index (BMI) 39.0-39.9, adult: Secondary | ICD-10-CM

## 2023-02-26 DIAGNOSIS — R632 Polyphagia: Secondary | ICD-10-CM | POA: Diagnosis not present

## 2023-02-26 DIAGNOSIS — R7301 Impaired fasting glucose: Secondary | ICD-10-CM

## 2023-02-26 MED ORDER — METFORMIN HCL 500 MG PO TABS: 500.0000 mg | ORAL_TABLET | Freq: Two times a day (BID) | ORAL | 0 refills | Status: DC

## 2023-03-05 DIAGNOSIS — M5442 Lumbago with sciatica, left side: Secondary | ICD-10-CM | POA: Diagnosis not present

## 2023-03-05 NOTE — Progress Notes (Signed)
Chief Complaint:   OBESITY Jackie Montoya is here to discuss her progress with her obesity treatment plan along with follow-up of her obesity related diagnoses. [name] is on keeping a food journal with goal of 1300 calories and 80-100 grams of protein daily and states she is following her eating plan approximately 60% of the time. [name] states she is riding her exercise bike and going to physical therapy for 45-60 minutes 1-7 times per week.  Today's visit was #: 43 Starting weight: 287 lbs Starting date: 12/22/2019 Today's weight: 241 lbs Today's date: 02/26/2023 Total lbs lost to date: 46 Total lbs lost since last in-office visit: +3  Interim History: She is up 3 pounds from her last visit but has done well overall.  She is doing a protein shake in the morning.  Subjective:   1. IFG (impaired fasting glucose) No medications.  2. Polyphagia No medications.  Assessment/Plan:   1. IFG (impaired fasting glucose) 1.  Will keep all carbohydrates low (sugar and starches). 2.  New prescription- - metFORMIN (GLUCOPHAGE) 500 MG tablet; Take 1 tablet (500 mg total) by mouth 2 (two) times daily with a meal.  Dispense: 180 tablet; Refill: 0 3.  Information sheet.  2. Polyphagia 1.  Will keep fiber, water and protein high. - metFORMIN (GLUCOPHAGE) 500 MG tablet; Take 1 tablet (500 mg total) by mouth 2 (two) times daily with a meal.  Dispense: 180 tablet; Refill: 0  3. Morbid obesity ([place]) BMI 39.0-39.9,adult 1.  Meal planning. 2.  Intentional eating. 3.  Increase water (will take water with her), increase to 60-64 ounces.  [name] is currently in the action stage of change. As such, her goal is to continue with weight loss efforts. She has agreed to keeping a food journal with goal of 1300 calories and 80 grams of protein daily.  Exercise goals: Continue to ride her exercise bike and go to PT.  Behavioral modification strategies: increasing lean protein intake, decreasing simple  carbohydrates, increasing vegetables, increasing water intake, decreasing eating out, no skipping meals, meal planning and cooking strategies, keeping healthy foods in the home, and planning for success.  [name] has agreed to follow-up with our clinic in 4 weeks. She was informed of the importance of frequent follow-up visits to maximize her success with intensive lifestyle modifications for her multiple health conditions.   Objective:   Blood pressure 112/60, pulse 73, temperature 97.6 F (36.4 C), height 5\' 5"  (1.651 m), weight 241 lb (109.3 kg), last menstrual period 06/15/2002, SpO2 98 %. Body mass index is 40.1 kg/m.  General: Cooperative, alert, well developed, in no acute distress. HEENT: Conjunctivae and lids unremarkable. Cardiovascular: Regular rhythm.  Lungs: Normal work of breathing. Neurologic: No focal deficits.   Lab Results  Component Value Date   CREATININE 0.76 06/05/2022   BUN 19 06/05/2022   NA 137 06/05/2022   K 3.9 06/05/2022   CL 102 06/05/2022   CO2 29 06/05/2022   Lab Results  Component Value Date   ALT 15 06/05/2022   AST 17 06/05/2022   ALKPHOS 80 06/05/2022   BILITOT 0.6 06/05/2022   Lab Results  Component Value Date   HGBA1C 5.1 07/11/2022   HGBA1C 5.3 01/22/2022   HGBA1C 5.3 07/11/2021   HGBA1C 5.5 11/02/2020   HGBA1C 5.5 04/27/2020   Lab Results  Component Value Date   INSULIN 10.8 07/11/2022   INSULIN 11.6 01/22/2022   INSULIN 7.3 07/11/2021   INSULIN 10.2 11/02/2020   INSULIN 9.2  04/27/2020   Lab Results  Component Value Date   TSH 1.870 12/22/2019   Lab Results  Component Value Date   CHOL 181 01/22/2022   HDL 73 01/22/2022   LDLCALC 89 01/22/2022   TRIG 109 01/22/2022   Lab Results  Component Value Date   VD25OH 62.1 07/11/2022   VD25OH 57.5 01/22/2022   VD25OH 64.5 07/11/2021   Lab Results  Component Value Date   WBC 7.5 06/05/2022   HGB 14.1 06/05/2022   HCT 41.4 06/05/2022   MCV 88.5 06/05/2022   PLT 248  06/05/2022   No results found for: "IRON", "TIBC", "FERRITIN"  Attestation Statements:   Reviewed by clinician on day of visit: allergies, medications, problem list, medical history, surgical history, family history, social history, and previous encounter notes.  I, Dawn Whitmire, FNP-C, am acting as transcriptionist for Dr. Jearld Lesch.  I have reviewed the above documentation for accuracy and completeness, and I agree with the above. Jearld Lesch, DO

## 2023-03-12 ENCOUNTER — Encounter: Payer: Self-pay | Admitting: Bariatrics

## 2023-03-18 DIAGNOSIS — M5442 Lumbago with sciatica, left side: Secondary | ICD-10-CM | POA: Diagnosis not present

## 2023-03-25 DIAGNOSIS — F39 Unspecified mood [affective] disorder: Secondary | ICD-10-CM | POA: Diagnosis not present

## 2023-03-25 DIAGNOSIS — I10 Essential (primary) hypertension: Secondary | ICD-10-CM | POA: Diagnosis not present

## 2023-03-25 DIAGNOSIS — F419 Anxiety disorder, unspecified: Secondary | ICD-10-CM | POA: Diagnosis not present

## 2023-03-25 DIAGNOSIS — K219 Gastro-esophageal reflux disease without esophagitis: Secondary | ICD-10-CM | POA: Diagnosis not present

## 2023-03-25 DIAGNOSIS — Z853 Personal history of malignant neoplasm of breast: Secondary | ICD-10-CM | POA: Diagnosis not present

## 2023-03-25 DIAGNOSIS — M5442 Lumbago with sciatica, left side: Secondary | ICD-10-CM | POA: Diagnosis not present

## 2023-03-25 DIAGNOSIS — E782 Mixed hyperlipidemia: Secondary | ICD-10-CM | POA: Diagnosis not present

## 2023-03-25 DIAGNOSIS — R7303 Prediabetes: Secondary | ICD-10-CM | POA: Diagnosis not present

## 2023-03-26 ENCOUNTER — Ambulatory Visit (INDEPENDENT_AMBULATORY_CARE_PROVIDER_SITE_OTHER): Payer: Medicare Other | Admitting: Bariatrics

## 2023-03-26 ENCOUNTER — Encounter: Payer: Self-pay | Admitting: Bariatrics

## 2023-03-26 VITALS — BP 131/77 | HR 78 | Temp 98.1°F | Ht 65.0 in | Wt 241.0 lb

## 2023-03-26 DIAGNOSIS — R7301 Impaired fasting glucose: Secondary | ICD-10-CM | POA: Diagnosis not present

## 2023-03-26 DIAGNOSIS — R632 Polyphagia: Secondary | ICD-10-CM | POA: Diagnosis not present

## 2023-03-26 DIAGNOSIS — Z6841 Body Mass Index (BMI) 40.0 and over, adult: Secondary | ICD-10-CM

## 2023-03-26 NOTE — Progress Notes (Signed)
Chief Complaint:   OBESITY Jackie Montoya is here to discuss her progress with her obesity treatment plan along with follow-up of her obesity related diagnoses. Jackie Montoya is on keeping a food journal and adhering to recommended goals of 1300 calories and 80 grams of protein and states she is following her eating plan approximately 75% of the time. Jackie Montoya states she is riding the exercise bike, doing physical therapy, and home exercises for 10-60 minutes 1-6 times per week.  Today's visit was #: 48 Starting weight: 287 lbs Starting date: 12/22/2019 Today's weight: 241 lbs Today's date: 03/26/2023 Total lbs lost to date: 46 Total lbs lost since last in-office visit: 0  Interim History: Jackie Montoya's weight remains the same.  According to our bioimpedance scale her body fat percentage is down and her fat mass is down 1.2 pounds.  The scale overall shows the same weight.  She has been off the Ozempic for 1 week.  Subjective:   1. IFG (impaired fasting glucose) Jackie Montoya is taking metformin currently.   2. Polyphagia Jackie Montoya is taking metformin currently.   Assessment/Plan:   1. IFG (impaired fasting glucose) Jackie Montoya will keep carbohydrates low (starches and sweets).   2. Polyphagia Jackie Montoya will increase metformin to 1,500 mg daily instead of 1,000 mg.   3. Morbid obesity  4. BMI 50.0-59.9, adult Jackie Montoya is currently in the action stage of change. As such, her goal is to continue with weight loss efforts. She has agreed to keeping a food journal and adhering to recommended goals of 1300 calories and 80 grams of protein.   Jackie Montoya will adhere to to the plan 85-95%.  Meal planning was discussed.  She will keep up her water and protein intake.  Increase fruits and vegetables.  We will recheck fasting labs at her next office visit.  Exercise goals: As is.   Behavioral modification strategies: increasing lean protein intake, decreasing simple carbohydrates, increasing vegetables, increasing water intake, decreasing  eating out, no skipping meals, meal planning and cooking strategies, keeping healthy foods in the home, and planning for success.  Jackie Montoya has agreed to follow-up with our clinic in 4 weeks. She was informed of the importance of frequent follow-up visits to maximize her success with intensive lifestyle modifications for her multiple health conditions.   Objective:   Blood pressure 131/77, pulse 78, temperature 98.1 F (36.7 C), height 5\' 5"  (1.651 m), weight 241 lb (109.3 kg), last menstrual period 06/15/2002, SpO2 95 %. Body mass index is 40.1 kg/m.  General: Cooperative, alert, well developed, in no acute distress. HEENT: Conjunctivae and lids unremarkable. Cardiovascular: Regular rhythm.  Lungs: Normal work of breathing. Neurologic: No focal deficits.   Lab Results  Component Value Date   CREATININE 0.76 06/05/2022   BUN 19 06/05/2022   NA 137 06/05/2022   K 3.9 06/05/2022   CL 102 06/05/2022   CO2 29 06/05/2022   Lab Results  Component Value Date   ALT 15 06/05/2022   AST 17 06/05/2022   ALKPHOS 80 06/05/2022   BILITOT 0.6 06/05/2022   Lab Results  Component Value Date   HGBA1C 5.1 07/11/2022   HGBA1C 5.3 01/22/2022   HGBA1C 5.3 07/11/2021   HGBA1C 5.5 11/02/2020   HGBA1C 5.5 04/27/2020   Lab Results  Component Value Date   INSULIN 10.8 07/11/2022   INSULIN 11.6 01/22/2022   INSULIN 7.3 07/11/2021   INSULIN 10.2 11/02/2020   INSULIN 9.2 04/27/2020   Lab Results  Component Value Date   TSH 1.870  12/22/2019   Lab Results  Component Value Date   CHOL 181 01/22/2022   HDL 73 01/22/2022   LDLCALC 89 01/22/2022   TRIG 109 01/22/2022   Lab Results  Component Value Date   VD25OH 62.1 07/11/2022   VD25OH 57.5 01/22/2022   VD25OH 64.5 07/11/2021   Lab Results  Component Value Date   WBC 7.5 06/05/2022   HGB 14.1 06/05/2022   HCT 41.4 06/05/2022   MCV 88.5 06/05/2022   PLT 248 06/05/2022   No results found for: "IRON", "TIBC", "FERRITIN"  Attestation  Statements:   Reviewed by clinician on day of visit: allergies, medications, problem list, medical history, surgical history, family history, social history, and previous encounter notes.  Trude Mcburney, am acting as Energy manager for [employer], DO.  I have reviewed the above documentation for accuracy and completeness, and I agree with the above. Corinna Capra, DO

## 2023-04-01 ENCOUNTER — Encounter: Payer: Self-pay | Admitting: Bariatrics

## 2023-04-01 DIAGNOSIS — M5442 Lumbago with sciatica, left side: Secondary | ICD-10-CM | POA: Diagnosis not present

## 2023-04-11 DIAGNOSIS — M5442 Lumbago with sciatica, left side: Secondary | ICD-10-CM | POA: Diagnosis not present

## 2023-04-16 ENCOUNTER — Encounter: Payer: Self-pay | Admitting: Bariatrics

## 2023-04-16 DIAGNOSIS — M5442 Lumbago with sciatica, left side: Secondary | ICD-10-CM | POA: Diagnosis not present

## 2023-04-16 DIAGNOSIS — I10 Essential (primary) hypertension: Secondary | ICD-10-CM | POA: Diagnosis not present

## 2023-04-16 DIAGNOSIS — G4733 Obstructive sleep apnea (adult) (pediatric): Secondary | ICD-10-CM | POA: Diagnosis not present

## 2023-04-16 NOTE — Telephone Encounter (Signed)
Can you please advise?

## 2023-04-23 ENCOUNTER — Ambulatory Visit (INDEPENDENT_AMBULATORY_CARE_PROVIDER_SITE_OTHER): Payer: Medicare Other | Admitting: Bariatrics

## 2023-04-23 ENCOUNTER — Encounter: Payer: Self-pay | Admitting: Bariatrics

## 2023-04-23 VITALS — BP 134/80 | HR 76 | Temp 98.2°F | Ht 65.0 in | Wt 240.0 lb

## 2023-04-23 DIAGNOSIS — M9903 Segmental and somatic dysfunction of lumbar region: Secondary | ICD-10-CM | POA: Diagnosis not present

## 2023-04-23 DIAGNOSIS — M62830 Muscle spasm of back: Secondary | ICD-10-CM | POA: Diagnosis not present

## 2023-04-23 DIAGNOSIS — Z6839 Body mass index (BMI) 39.0-39.9, adult: Secondary | ICD-10-CM

## 2023-04-23 DIAGNOSIS — M9902 Segmental and somatic dysfunction of thoracic region: Secondary | ICD-10-CM | POA: Diagnosis not present

## 2023-04-23 DIAGNOSIS — M5415 Radiculopathy, thoracolumbar region: Secondary | ICD-10-CM | POA: Diagnosis not present

## 2023-04-23 DIAGNOSIS — E782 Mixed hyperlipidemia: Secondary | ICD-10-CM | POA: Diagnosis not present

## 2023-04-23 DIAGNOSIS — R7301 Impaired fasting glucose: Secondary | ICD-10-CM | POA: Diagnosis not present

## 2023-04-23 DIAGNOSIS — R632 Polyphagia: Secondary | ICD-10-CM

## 2023-04-23 DIAGNOSIS — E559 Vitamin D deficiency, unspecified: Secondary | ICD-10-CM | POA: Diagnosis not present

## 2023-04-23 MED ORDER — METFORMIN HCL 500 MG PO TABS: 500.0000 mg | ORAL_TABLET | Freq: Two times a day (BID) | ORAL | 0 refills | Status: DC

## 2023-04-24 LAB — COMPREHENSIVE METABOLIC PANEL
ALT: 20 IU/L (ref 0–32)
AST: 22 IU/L (ref 0–40)
Albumin/Globulin Ratio: 2 (ref 1.2–2.2)
Albumin: 4.5 g/dL (ref 3.8–4.8)
Alkaline Phosphatase: 111 IU/L (ref 44–121)
BUN/Creatinine Ratio: 23 (ref 12–28)
BUN: 18 mg/dL (ref 8–27)
Bilirubin Total: 0.5 mg/dL (ref 0.0–1.2)
CO2: 25 mmol/L (ref 20–29)
Calcium: 9.7 mg/dL (ref 8.7–10.3)
Chloride: 99 mmol/L (ref 96–106)
Creatinine, Ser: 0.77 mg/dL (ref 0.57–1.00)
Globulin, Total: 2.2 g/dL (ref 1.5–4.5)
Glucose: 92 mg/dL (ref 70–99)
Potassium: 4.6 mmol/L (ref 3.5–5.2)
Sodium: 140 mmol/L (ref 134–144)
Total Protein: 6.7 g/dL (ref 6.0–8.5)
eGFR: 82 mL/min/{1.73_m2} (ref >59)

## 2023-04-24 LAB — LIPID PANEL WITH LDL/HDL RATIO
Cholesterol, Total: 189 mg/dL (ref 100–199)
HDL: 85 mg/dL (ref >39)
LDL Chol Calc (NIH): 91 mg/dL (ref 0–99)
LDL/HDL Ratio: 1.1 ratio (ref 0.0–3.2)
Triglycerides: 71 mg/dL (ref 0–149)
VLDL Cholesterol Cal: 13 mg/dL (ref 5–40)

## 2023-04-24 LAB — VITAMIN D 25 HYDROXY (VIT D DEFICIENCY, FRACTURES): Vit D, 25-Hydroxy: 66.9 ng/mL (ref 30.0–100.0)

## 2023-04-24 LAB — INSULIN, RANDOM: INSULIN: 5.3 u[IU]/mL (ref 2.6–24.9)

## 2023-04-24 LAB — HEMOGLOBIN A1C
Est. average glucose Bld gHb Est-mCnc: 103 mg/dL
Hgb A1c MFr Bld: 5.2 % (ref 4.8–5.6)

## 2023-04-28 ENCOUNTER — Encounter: Payer: Self-pay | Admitting: Bariatrics

## 2023-04-28 DIAGNOSIS — M9903 Segmental and somatic dysfunction of lumbar region: Secondary | ICD-10-CM | POA: Diagnosis not present

## 2023-04-28 DIAGNOSIS — M9902 Segmental and somatic dysfunction of thoracic region: Secondary | ICD-10-CM | POA: Diagnosis not present

## 2023-04-28 DIAGNOSIS — M5415 Radiculopathy, thoracolumbar region: Secondary | ICD-10-CM | POA: Diagnosis not present

## 2023-04-28 DIAGNOSIS — M62830 Muscle spasm of back: Secondary | ICD-10-CM | POA: Diagnosis not present

## 2023-04-28 NOTE — Progress Notes (Signed)
Chief Complaint:   OBESITY Jackie Montoya is here to discuss her progress with her obesity treatment plan along with follow-up of her obesity related diagnoses. Jackie Montoya is on keeping a food journal and adhering to recommended goals of 1300 calories and 80 grams of protein and states she is following her eating plan approximately 65-75% of the time. Jackie Montoya states she is doing physical therapy and bike riding for 20-60 minutes 1-6 times per week.  Today's visit was #: 49 Starting weight: 287 lbs Starting date: 12/22/2019 Today's weight: 240 lbs Today's date: 04/23/2023 Total lbs lost to date: 47 Total lbs lost since last in-office visit: 1  Interim History: Jackie Montoya is down 1 pound since her last visit.  She is doing better.  Subjective:   1. IFG (impaired fasting glucose) Jackie Montoya is taking metformin, and she started on Ozempic.  2. Jackie Montoya started on Ozempic with no side effects.  3. Mixed hyperlipidemia Jackie Montoya is taking Lipitor as directed.  4. Vitamin D deficiency Jackie Montoya is taking vitamin D as directed.  Assessment/Plan:   1. IFG (impaired fasting glucose) We will check labs today.  Jackie Montoya will continue her medications, and we will refill metformin for 90 days.  - metFORMIN (GLUCOPHAGE) 500 MG tablet; Take 1 tablet (500 mg total) by mouth 2 (two) times daily with a meal.  Dispense: 180 tablet; Refill: 0 - Hemoglobin A1c - Insulin, random  2. Polyphagia Jackie Montoya will continue her medications, and will work on keeping carbohydrates low.  - metFORMIN (GLUCOPHAGE) 500 MG tablet; Take 1 tablet (500 mg total) by mouth 2 (two) times daily with a meal.  Dispense: 180 tablet; Refill: 0  3. Mixed hyperlipidemia We will check labs today.  Jackie Montoya will continue her medications as directed.  - Lipid Panel With LDL/HDL Ratio  4. Vitamin D deficiency We will check labs today, and we will follow-up at Jackie Montoya's next visit.  - VITAMIN D 25 Hydroxy (Vit-D Deficiency, Fractures)  5. Morbid  obesity (Jackie Montoya) - Hemoglobin A1c - Insulin, random - Lipid Panel With LDL/HDL Ratio - VITAMIN D 25 Hydroxy (Vit-D Deficiency, Fractures) - Comprehensive metabolic panel  6. BMI 39.0-39.9,adult Jackie Montoya is currently in the action stage of change. As such, her goal is to continue with weight loss efforts. She has agreed to keeping a food journal and adhering to recommended goals of 1300 calories and 80 grams of protein.   Meal planning was discussed.  She will adhere to the plan 85-95%.  Decrease carbohydrates.  Exercise goals: As is.  Behavioral modification strategies: increasing lean protein intake, decreasing simple carbohydrates, increasing vegetables, increasing water intake, decreasing eating out, no skipping meals, meal planning and cooking strategies, keeping healthy foods in the home, and planning for success.  Jackie Montoya has agreed to follow-up with our clinic in 4 weeks. She was informed of the importance of frequent follow-up visits to maximize her success with intensive lifestyle modifications for her multiple health conditions.   Objective:   Blood pressure 134/80, pulse 76, temperature 98.2 F (36.8 C), height 5\' 5"  (1.651 m), weight 240 lb (108.9 kg), last menstrual period 06/15/2002, SpO2 96 %. Body mass index is 39.94 kg/m.  General: Cooperative, alert, well developed, in no acute distress. HEENT: Conjunctivae and lids unremarkable. Cardiovascular: Regular rhythm.  Lungs: Normal work of breathing. Neurologic: No focal deficits.   Lab Results  Component Value Date   CREATININE 0.77 04/23/2023   BUN 18 04/23/2023   NA 140 04/23/2023   K 4.6 04/23/2023  CL 99 04/23/2023   CO2 25 04/23/2023   Lab Results  Component Value Date   ALT 20 04/23/2023   AST 22 04/23/2023   ALKPHOS 111 04/23/2023   BILITOT 0.5 04/23/2023   Lab Results  Component Value Date   HGBA1C 5.2 04/23/2023   HGBA1C 5.1 07/11/2022   HGBA1C 5.3 01/22/2022   HGBA1C 5.3 07/11/2021   HGBA1C 5.5  11/02/2020   Lab Results  Component Value Date   INSULIN 5.3 04/23/2023   INSULIN 10.8 07/11/2022   INSULIN 11.6 01/22/2022   INSULIN 7.3 07/11/2021   INSULIN 10.2 11/02/2020   Lab Results  Component Value Date   TSH 1.870 12/22/2019   Lab Results  Component Value Date   CHOL 189 04/23/2023   HDL 85 04/23/2023   LDLCALC 91 04/23/2023   TRIG 71 04/23/2023   Lab Results  Component Value Date   VD25OH 66.9 04/23/2023   VD25OH 62.1 07/11/2022   VD25OH 57.5 01/22/2022   Lab Results  Component Value Date   WBC 7.5 06/05/2022   HGB 14.1 06/05/2022   HCT 41.4 06/05/2022   MCV 88.5 06/05/2022   PLT 248 06/05/2022   No results found for: "IRON", "TIBC", "FERRITIN"  Attestation Statements:   Reviewed by clinician on day of visit: allergies, medications, problem list, medical history, surgical history, family history, social history, and previous encounter notes.   Trude Mcburney, am acting as Energy manager for [employer], DO.  I have reviewed the above documentation for accuracy and completeness, and I agree with the above. Corinna Capra, DO

## 2023-04-30 DIAGNOSIS — M5442 Lumbago with sciatica, left side: Secondary | ICD-10-CM | POA: Diagnosis not present

## 2023-05-01 DIAGNOSIS — M62830 Muscle spasm of back: Secondary | ICD-10-CM | POA: Diagnosis not present

## 2023-05-01 DIAGNOSIS — M5415 Radiculopathy, thoracolumbar region: Secondary | ICD-10-CM | POA: Diagnosis not present

## 2023-05-01 DIAGNOSIS — M9902 Segmental and somatic dysfunction of thoracic region: Secondary | ICD-10-CM | POA: Diagnosis not present

## 2023-05-01 DIAGNOSIS — M9903 Segmental and somatic dysfunction of lumbar region: Secondary | ICD-10-CM | POA: Diagnosis not present

## 2023-05-05 ENCOUNTER — Other Ambulatory Visit: Payer: Medicare Other

## 2023-05-08 NOTE — Progress Notes (Unsigned)
Patient Care Team: Ollen Bowl, MD as PCP - General (Internal Medicine) Manus Rudd, MD as Consulting Physician (General Surgery) Serena Croissant, MD as Consulting Physician (Hematology and Oncology) Lonie Peak, MD as Attending Physician (Radiation Oncology) Carlisle Cater, MD as Consulting Physician (Obstetrics and Gynecology) [place], Amy, MD as Consulting Physician (Dermatology)  DIAGNOSIS: No diagnosis found.  SUMMARY OF ONCOLOGIC HISTORY: Oncology History  Malignant neoplasm of upper-inner quadrant of left breast in female, estrogen receptor positive (HCC)  06/03/2022 Initial Diagnosis   Malignant neoplasm of upper-inner quadrant of left breast in female, estrogen receptor positive (HCC)   06/05/2022 Cancer Staging   Staging form: Breast, AJCC 8th Edition - Clinical: Stage IA (cT1b, cN0, cM0, G1, ER+, PR+, HER2-) - Signed by Serena Croissant, MD on 06/05/2022 Stage prefix: Initial diagnosis Histologic grading system: 3 grade system   06/12/2022 Genetic Testing   Negative hereditary cancer genetic testing: no pathogenic variants detected in [facility] BRCAPlus Panel.  Report date is June 12, 2022.   The BRCAplus panel offered by [employer] and includes sequencing and deletion/duplication analysis for the following 8 genes: ATM, BRCA1, BRCA2, CDH1, CHEK2, PALB2, PTEN, and TP53.  Pan-cancer panel is pending.    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 female is here because of recent diagnosis of left breast cancer. She presents to the clinic today for a  follow-up.   ALLERGIES:  is allergic to codeine.  MEDICATIONS:  Current Outpatient Medications  Medication Sig Dispense Refill   albuterol (VENTOLIN HFA) 108 (90 Base) MCG/ACT inhaler INHALE 2 PUFFS BY MOUTH EVERY 4 HOURS AS NEEDED FOR WHEEZING AND COUGH (Patient not taking: Reported on 04/23/2023)     atorvastatin (LIPITOR) 20 MG tablet Take 20 mg by mouth daily.     buPROPion (WELLBUTRIN XL) 300 MG 24 hr tablet Take 300 mg by mouth daily.     CALCIUM PO Take by mouth daily.      Cholecalciferol (VITAMIN D) 125 MCG (5000 UT) CAPS Take 1 capsule by mouth daily. 30 capsule 0   FLUoxetine (PROZAC) 40 MG capsule Take 40 mg by mouth daily.     fluticasone (FLONASE) 50 MCG/ACT nasal spray Place into both nostrils daily.     hydrochlorothiazide (HYDRODIURIL) 25 MG tablet Take 25 mg by mouth daily.     letrozole (FEMARA) 2.5 MG tablet TAKE 1 TABLET(2.5 MG) BY MOUTH DAILY 90 tablet 3   loratadine (CLARITIN) 10 MG tablet Take 10 mg by mouth daily.     losartan (COZAAR) 25 MG tablet Take 25 mg by mouth daily.     metFORMIN (GLUCOPHAGE) 500 MG tablet Take 1 tablet (500 mg total) by mouth 2 (two) times daily with a meal. 180 tablet 0   omeprazole (PRILOSEC) 20 MG capsule Take 20 mg by mouth daily.     Probiotic Product (PROBIOTIC DAILY PO) Take 2 capsules by mouth daily. Emma Probiotic     Turmeric (QC TUMERIC COMPLEX) 500 MG CAPS Take 1 capsule by mouth daily.  No current facility-administered medications for this visit.    PHYSICAL EXAMINATION: ECOG PERFORMANCE STATUS: {CHL ONC ECOG [phone]}  There were no vitals filed for this visit. There were no vitals filed for this visit.  BREAST:*** No palpable masses or nodules in either right or left breasts. No palpable axillary supraclavicular or infraclavicular adenopathy no breast tenderness or nipple discharge. (exam performed in the presence of a chaperone)  LABORATORY DATA:  I have reviewed the data as listed    Latest Ref Rng &  Units 04/23/2023   12:20 PM 06/05/2022   12:14 PM 01/22/2022   10:54 AM  CMP  Glucose 70 - 99 mg/dL 92  95  90   BUN 8 - 27 mg/dL 18  19  14    Creatinine 0.57 - 1.00 mg/dL 1.61  0.96  0.45   Sodium 134 - 144 mmol/L 140  137  139   Potassium 3.5 - 5.2 mmol/L 4.6  3.9  3.9   Chloride 96 - 106 mmol/L 99  102  97   CO2 20 - 29 mmol/L 25  29  24    Calcium 8.7 - 10.3 mg/dL 9.7  9.6  9.7   Total Protein 6.0 - 8.5 g/dL 6.7  7.1  6.7   Total Bilirubin 0.0 - 1.2 mg/dL 0.5  0.6  0.8   Alkaline Phos 44 - 121 IU/L 111  80  104   AST 0 - 40 IU/L 22  17  20    ALT 0 - 32 IU/L 20  15  13      Lab Results  Component Value Date   WBC 7.5 06/05/2022   HGB 14.1 06/05/2022   HCT 41.4 06/05/2022   MCV 88.5 06/05/2022   PLT 248 06/05/2022   NEUTROABS 4.7 06/05/2022    ASSESSMENT & PLAN:  No problem-specific Assessment & Plan notes found for this encounter.    No orders of the defined types were placed in this encounter.  The patient has a good understanding of the overall plan. she agrees with it. she will call with any problems that may develop before the next visit here. Total time spent: 30 mins including face to face time and time spent for planning, charting and co-ordination of care   Sherlyn Lick, CMA 05/08/23    I Janan Ridge am acting as a Neurosurgeon for [employer]  ***

## 2023-05-09 ENCOUNTER — Ambulatory Visit
Admission: RE | Admit: 2023-05-09 | Discharge: 2023-05-09 | Disposition: A | Discharge status: Home / Self Care | Payer: Medicare Other | Source: Ambulatory Visit | Attending: Adult Health | Admitting: Adult Health

## 2023-05-09 DIAGNOSIS — E2839 Other primary ovarian failure: Secondary | ICD-10-CM

## 2023-05-09 DIAGNOSIS — M8588 Other specified disorders of bone density and structure, other site: Secondary | ICD-10-CM | POA: Diagnosis not present

## 2023-05-09 DIAGNOSIS — E349 Endocrine disorder, unspecified: Secondary | ICD-10-CM | POA: Diagnosis not present

## 2023-05-09 DIAGNOSIS — C50212 Malignant neoplasm of upper-inner quadrant of left female breast: Secondary | ICD-10-CM

## 2023-05-09 DIAGNOSIS — R92323 Mammographic fibroglandular density, bilateral breasts: Secondary | ICD-10-CM | POA: Diagnosis not present

## 2023-05-09 DIAGNOSIS — N958 Other specified menopausal and perimenopausal disorders: Secondary | ICD-10-CM | POA: Diagnosis not present

## 2023-05-09 DIAGNOSIS — Z853 Personal history of malignant neoplasm of breast: Secondary | ICD-10-CM | POA: Diagnosis not present

## 2023-05-13 ENCOUNTER — Inpatient Hospital Stay: Payer: Medicare Other | Attending: Hematology and Oncology | Admitting: Hematology and Oncology

## 2023-05-13 ENCOUNTER — Other Ambulatory Visit: Payer: Self-pay | Admitting: Hematology and Oncology

## 2023-05-13 VITALS — BP 143/95 | HR 85 | Temp 97.8°F | Resp 18 | Ht 65.0 in | Wt 245.2 lb

## 2023-05-13 DIAGNOSIS — Z170 Estrogen receptor positive status [ER+]: Secondary | ICD-10-CM | POA: Insufficient documentation

## 2023-05-13 DIAGNOSIS — Z79899 Other long term (current) drug therapy: Secondary | ICD-10-CM | POA: Diagnosis not present

## 2023-05-13 DIAGNOSIS — M25559 Pain in unspecified hip: Secondary | ICD-10-CM | POA: Insufficient documentation

## 2023-05-13 DIAGNOSIS — Z79811 Long term (current) use of aromatase inhibitors: Secondary | ICD-10-CM | POA: Insufficient documentation

## 2023-05-13 DIAGNOSIS — C50212 Malignant neoplasm of upper-inner quadrant of left female breast: Secondary | ICD-10-CM | POA: Diagnosis not present

## 2023-05-13 DIAGNOSIS — Z923 Personal history of irradiation: Secondary | ICD-10-CM | POA: Insufficient documentation

## 2023-05-13 DIAGNOSIS — Z7984 Long term (current) use of oral hypoglycemic drugs: Secondary | ICD-10-CM | POA: Insufficient documentation

## 2023-05-13 MED ORDER — ANASTROZOLE 1 MG PO TABS: 1.0000 mg | ORAL_TABLET | Freq: Every day | ORAL | 0 refills | Status: DC

## 2023-05-13 NOTE — Assessment & Plan Note (Addendum)
05/28/2022 screening mammogram detected left breast asymmetry at 11 o'clock position measuring 0.6 cm.  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 XRT: Completed 08/22/2022 3.  Adjuvant antiestrogen therapy with letrozole started 12/24/2022 switching to anastrozole 05/26/2023  Letrozole toxicities: Diffuse muscle aches and pains especially in the left hip and knee.  We discussed about stopping letrozole waiting 2 weeks and then switching to anastrozole therapy.  Patient is completely amenable to that idea.   Breast cancer surveillance: Mammogram 05/09/2023: Benign breast density category B Bone density 05/09/2023: T-score -1.6: (Used to be -0.4 in 2022) mild osteopenia   I sent the prescription to [facility].  We will see her with a telephone visit in 2 weeks and after that we can send the full prescription to her mail-in pharmacy.

## 2023-05-15 DIAGNOSIS — M9903 Segmental and somatic dysfunction of lumbar region: Secondary | ICD-10-CM | POA: Diagnosis not present

## 2023-05-15 DIAGNOSIS — M62830 Muscle spasm of back: Secondary | ICD-10-CM | POA: Diagnosis not present

## 2023-05-15 DIAGNOSIS — M5442 Lumbago with sciatica, left side: Secondary | ICD-10-CM | POA: Diagnosis not present

## 2023-05-15 DIAGNOSIS — M5415 Radiculopathy, thoracolumbar region: Secondary | ICD-10-CM | POA: Diagnosis not present

## 2023-05-15 DIAGNOSIS — M9902 Segmental and somatic dysfunction of thoracic region: Secondary | ICD-10-CM | POA: Diagnosis not present

## 2023-05-19 DIAGNOSIS — M62830 Muscle spasm of back: Secondary | ICD-10-CM | POA: Diagnosis not present

## 2023-05-19 DIAGNOSIS — M9903 Segmental and somatic dysfunction of lumbar region: Secondary | ICD-10-CM | POA: Diagnosis not present

## 2023-05-19 DIAGNOSIS — M9902 Segmental and somatic dysfunction of thoracic region: Secondary | ICD-10-CM | POA: Diagnosis not present

## 2023-05-19 DIAGNOSIS — M5415 Radiculopathy, thoracolumbar region: Secondary | ICD-10-CM | POA: Diagnosis not present

## 2023-05-22 ENCOUNTER — Encounter: Payer: Self-pay | Admitting: Bariatrics

## 2023-05-22 ENCOUNTER — Ambulatory Visit (INDEPENDENT_AMBULATORY_CARE_PROVIDER_SITE_OTHER): Payer: Medicare Other | Admitting: Bariatrics

## 2023-05-22 VITALS — BP 138/78 | HR 90 | Temp 97.8°F | Ht 65.0 in | Wt 237.0 lb

## 2023-05-22 DIAGNOSIS — Z6839 Body mass index (BMI) 39.0-39.9, adult: Secondary | ICD-10-CM

## 2023-05-22 DIAGNOSIS — E88819 Insulin resistance, unspecified: Secondary | ICD-10-CM

## 2023-05-26 NOTE — Progress Notes (Unsigned)
Chief Complaint:   OBESITY Jackie Montoya is here to discuss her progress with her obesity treatment plan along with follow-up of her obesity related diagnoses. Jackie Montoya is on keeping a food journal and adhering to recommended goals of 1300 calories and 80 grams of protein and states she is following her eating plan approximately 80% of the time. Jackie Montoya states she is riding the exercise bike for 10-20 minutes 7 times per week.  Today's visit was #: 50 Starting weight: 287 lbs Starting date: 12/22/2019 Today's weight: 237 lbs Today's date: 05/22/2023 Total lbs lost to date: 50 Total lbs lost since last in-office visit: 3  Interim History: Patient is down another 3 lbs for a total of 50 lbs.   Subjective:   1. Insulin resistance Patient is taking metformin. She was prescribed Ozempic. She notes decreased food noise.   Assessment/Plan:   1. Insulin resistance Patient will continuee to minimize all carbohydrates (sweets and starches). Travel eating strategies were discussed.   2. Morbid obesity (HCC)  3. Obesity with current BMI 39.5 Jackie Montoya is currently in the action stage of change. As such, her goal is to continue with weight loss efforts. She has agreed to keeping a food journal and adhering to recommended goals of 1300 calories and 80 grams of protein.   Meal planning and intentional eating were discussed. Patient is to have a protein drink in the morning.   Exercise goals: As is.   Behavioral modification strategies: increasing lean protein intake, decreasing simple carbohydrates, increasing vegetables, increasing water intake, decreasing eating out, no skipping meals, meal planning and cooking strategies, keeping healthy foods in the home, and planning for success.  Jackie Montoya has agreed to follow-up with our clinic in 4 weeks. She was informed of the importance of frequent follow-up visits to maximize her success with intensive lifestyle modifications for her multiple health conditions.    Objective:   Blood pressure 138/78, pulse 90, temperature 97.8 F (36.6 C), height 5\' 5"  (1.651 m), weight 237 lb (107.5 kg), last menstrual period 06/15/2002, SpO2 96 %. Body mass index is 39.44 kg/m.  General: Cooperative, alert, well developed, in no acute distress. HEENT: Conjunctivae and lids unremarkable. Cardiovascular: Regular rhythm.  Lungs: Normal work of breathing. Neurologic: No focal deficits.   Lab Results  Component Value Date   CREATININE 0.77 04/23/2023   BUN 18 04/23/2023   NA 140 04/23/2023   K 4.6 04/23/2023   CL 99 04/23/2023   CO2 25 04/23/2023   Lab Results  Component Value Date   ALT 20 04/23/2023   AST 22 04/23/2023   ALKPHOS 111 04/23/2023   BILITOT 0.5 04/23/2023   Lab Results  Component Value Date   HGBA1C 5.2 04/23/2023   HGBA1C 5.1 07/11/2022   HGBA1C 5.3 01/22/2022   HGBA1C 5.3 07/11/2021   HGBA1C 5.5 11/02/2020   Lab Results  Component Value Date   INSULIN 5.3 04/23/2023   INSULIN 10.8 07/11/2022   INSULIN 11.6 01/22/2022   INSULIN 7.3 07/11/2021   INSULIN 10.2 11/02/2020   Lab Results  Component Value Date   TSH 1.870 12/22/2019   Lab Results  Component Value Date   CHOL 189 04/23/2023   HDL 85 04/23/2023   LDLCALC 91 04/23/2023   TRIG 71 04/23/2023   Lab Results  Component Value Date   VD25OH 66.9 04/23/2023   VD25OH 62.1 07/11/2022   VD25OH 57.5 01/22/2022   Lab Results  Component Value Date   WBC 7.5 06/05/2022   HGB  14.1 06/05/2022   HCT 41.4 06/05/2022   MCV 88.5 06/05/2022   PLT 248 06/05/2022   No results found for: "IRON", "TIBC", "FERRITIN"  Attestation Statements:   Reviewed by clinician on day of visit: allergies, medications, problem list, medical history, surgical history, family history, social history, and previous encounter notes.   Trude Mcburney, am acting as Energy manager for [employer], DO.  I have reviewed the above documentation for accuracy and completeness, and I agree  with the above. Corinna Capra, DO

## 2023-05-29 DIAGNOSIS — M62830 Muscle spasm of back: Secondary | ICD-10-CM | POA: Diagnosis not present

## 2023-05-29 DIAGNOSIS — M5415 Radiculopathy, thoracolumbar region: Secondary | ICD-10-CM | POA: Diagnosis not present

## 2023-05-29 DIAGNOSIS — M9902 Segmental and somatic dysfunction of thoracic region: Secondary | ICD-10-CM | POA: Diagnosis not present

## 2023-05-29 DIAGNOSIS — M9903 Segmental and somatic dysfunction of lumbar region: Secondary | ICD-10-CM | POA: Diagnosis not present

## 2023-06-05 DIAGNOSIS — M62830 Muscle spasm of back: Secondary | ICD-10-CM | POA: Diagnosis not present

## 2023-06-05 DIAGNOSIS — M9902 Segmental and somatic dysfunction of thoracic region: Secondary | ICD-10-CM | POA: Diagnosis not present

## 2023-06-05 DIAGNOSIS — M9903 Segmental and somatic dysfunction of lumbar region: Secondary | ICD-10-CM | POA: Diagnosis not present

## 2023-06-05 DIAGNOSIS — M5415 Radiculopathy, thoracolumbar region: Secondary | ICD-10-CM | POA: Diagnosis not present

## 2023-06-17 NOTE — Progress Notes (Signed)
HEMATOLOGY-ONCOLOGY TELEPHONE VISIT PROGRESS NOTE  I connected with our patient on 06/24/23 at  1:45 PM EDT by telephone and verified that I am speaking with the correct person using two identifiers.  I discussed the limitations, risks, security and privacy concerns of performing an evaluation and management service by telephone and the availability of in person appointments.  I also discussed with the patient that there may be a patient responsible charge related to this service. The patient expressed understanding and agreed to proceed.   History of Present Illness: : Jackie Montoya is a 72 y.o. female is here because of recent diagnosis of left breast cancer. Currently on anastrozole. She presents to the clinic today for a telephone follow-up she reports that the symptoms related to the muscle aches and pains have not really improved on the anastrozole.  She has great difficulty with walking but she works hard to exercise regularly.  This is in spite of the chronic pain related to the muscles and bones.  Oncology History  Malignant neoplasm of upper-inner quadrant of left breast in female, estrogen receptor positive (HCC)  06/03/2022 Initial Diagnosis   Malignant neoplasm of upper-inner quadrant of left breast in female, estrogen receptor positive (HCC)   06/05/2022 Cancer Staging   Staging form: Breast, AJCC 8th Edition - Clinical: Stage IA (cT1b, cN0, cM0, G1, ER+, PR+, HER2-) - Signed by Serena Croissant, MD on 06/05/2022 Stage prefix: Initial diagnosis Histologic grading system: 3 grade system   06/12/2022 Genetic Testing   Negative hereditary cancer genetic testing: no pathogenic variants detected in [facility] BRCAPlus Panel.  Report date is June 12, 2022.   The BRCAplus panel offered by [employer] and includes sequencing and deletion/duplication analysis for the following 8 genes: ATM, BRCA1, BRCA2, CDH1, CHEK2, PALB2, PTEN, and TP53.  Pan-cancer panel is pending.    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 Observations/Objective:     Assessment Plan:  Malignant neoplasm of upper-inner quadrant of left breast in female, estrogen receptor positive (HCC) 05/28/2022 screening mammogram detected left breast asymmetry at 11 o'clock position measuring 0.6 cm.  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 XRT: Completed 08/22/2022 3.  Adjuvant antiestrogen therapy with letrozole started 12/24/2022 switching to anastrozole 05/26/2023 due to muscle aches and pains   Anastrozole toxicities:  Lower leg still aching and hip and lower back (even walking causes pain) Instructed the patient to reduce the dosage and half and take magnesium supplementation over-the-counter.    Breast cancer surveillance: Mammogram 05/09/2023: Benign breast density category B Bone density 05/09/2023: T-score -1.6: (Used to be -0.4 in 2022) mild osteopenia  Telephone visit in 1 month to discuss tolerance to lower dosage of anastrozole and magnesium  I discussed the assessment and treatment plan with the patient.  The patient was provided an opportunity to ask questions and all were answered. The patient agreed with the plan and demonstrated an understanding of the instructions. The patient was advised to call back or seek an  in-person evaluation if the symptoms worsen or if the condition fails to improve as anticipated.   I provided 12 minutes of non-face-to-face time during this encounter.  This includes time for charting and coordination of care   Tamsen Meek, MD  I Janan Ridge am acting as a scribe for Dr.Vinay Gudena  I have reviewed the above documentation for accuracy and completeness, and I agree with the above.

## 2023-06-23 ENCOUNTER — Encounter: Payer: Self-pay | Admitting: Bariatrics

## 2023-06-23 ENCOUNTER — Ambulatory Visit (INDEPENDENT_AMBULATORY_CARE_PROVIDER_SITE_OTHER): Payer: Medicare Other | Admitting: Bariatrics

## 2023-06-23 VITALS — BP 131/74 | HR 77 | Temp 97.8°F | Ht 65.0 in | Wt 237.0 lb

## 2023-06-23 DIAGNOSIS — E669 Obesity, unspecified: Secondary | ICD-10-CM

## 2023-06-23 DIAGNOSIS — R632 Polyphagia: Secondary | ICD-10-CM

## 2023-06-23 DIAGNOSIS — E88819 Insulin resistance, unspecified: Secondary | ICD-10-CM | POA: Diagnosis not present

## 2023-06-23 DIAGNOSIS — Z6839 Body mass index (BMI) 39.0-39.9, adult: Secondary | ICD-10-CM | POA: Diagnosis not present

## 2023-06-23 MED ORDER — OZEMPIC (2 MG/DOSE) 8 MG/3ML ~~LOC~~ SOPN: PEN_INJECTOR | SUBCUTANEOUS | 0 refills | Status: DC

## 2023-06-23 NOTE — Progress Notes (Signed)
WEIGHT SUMMARY AND BIOMETRICS  Weight Lost Since Last Visit: 0lb  Weight Gained Since Last Visit: 0lb   Vitals Temp: 97.8 F (36.6 C) BP: 131/74 Pulse Rate: 77 SpO2: 99 %   Anthropometric Measurements Height: 5\' 5"  (1.651 m) Weight: 237 lb (107.5 kg) BMI (Calculated): 39.44 Weight at Last Visit: 237lb Weight Lost Since Last Visit: 0lb Weight Gained Since Last Visit: 0lb Starting Weight: 287lb Total Weight Loss (lbs): 50 lb (22.7 kg)   Body Composition  Body Fat %: 51.3 % Fat Mass (lbs): 121.8 lbs Muscle Mass (lbs): 110 lbs Total Body Water (lbs): 83.6 lbs Visceral Fat Rating : 17   Other Clinical Data Fasting: No Labs: No Today's Visit #: 97 Starting Date: 12/22/19    OBESITY Jackie Montoya is here to discuss her progress with her obesity treatment plan along with follow-up of her obesity related diagnoses.     Nutrition Plan: keeping a food journal with goal of 1300 calories and 80 grams of protein daily - 75% adherence.  Current exercise: bicycling and cubie.  Interim History:  Her weight remains the same. The Ozempic helps with her appetite.  Eating all of the food on the plan., Not eating all of the food on the plan., Protein intake is as prescribed, Is not skipping meals, and Not journaling consistently.  Pharmacotherapy: Rhilee is on Metformin 500 mg twice daily with meals. She occasionally has some nausea in the am.  Adverse side effects: None Hunger is moderately controlled.  Cravings are moderately controlled.  Assessment/Plan:   Insulin Resistance Jackie Montoya has had elevated fasting insulin readings. Goal is HgbA1c < 5.7, fasting insulin at l0 or less, and preferably at 5.  She denies polyphagia. Medication(s): Metformin and Ozempic  Lab Results  Component Value Date   HGBA1C 5.2 04/23/2023   Lab Results  Component Value Date   INSULIN 5.3 04/23/2023   INSULIN 10.8 07/11/2022   INSULIN 11.6 01/22/2022   INSULIN 7.3 07/11/2021    INSULIN 10.2 11/02/2020    Plan Medication(s): Ozempic 2 mg SQ weekly and Metformin 500 mg twice daily with meals Will work on the agreed upon plan. Will minimize refined carbohydrates ( sweets and starches), and focus more on complex carbohydrates.  Increase the micronutrients found in leafy greens, which include magnesium, polyphenols, and vitamin C which have been postulated to help with insulin sensitivity. Minimize "fast food" and cook more meals at home.  Increase fiber to 25 to 30 grams daily.  Information sheet on " Insulin Resistance and Prediabetes".    Polyphagia [name] endorses excessive hunger.  Medication(s): Metformin and Ozempic Effects of medication:  moderately controlled. Cravings are moderately controlled.   Plan: Medication(s): Ozempic 2 mg SQ weekly and Metformin 500 mg twice daily with meals Will increase water, protein and fiber to help assuage hunger.  Will minimize foods that have a high glucose index/load to minimize reactive hypoglycemia.     Generalized Obesity: Current BMI BMI (Calculated): 39.44   Will continue exercise and will increase over time.   Pharmacotherapy Plan Continue and refill  Ozempic 2 mg SQ weekly  Jackie Montoya is currently in the action stage of change. As such, her goal is to continue with weight loss efforts.  She has agreed to keeping a food journal with goal of 1,300  calories and 80 grams of protein daily.  Exercise goals: Will continue exercise.   Behavioral modification strategies: increasing lean protein intake, meal planning , increase water intake, increasing vegetables, increasing fiber rich foods,  and increase frequency of journaling.  Jackie Montoya has agreed to follow-up with our clinic in 4 weeks.   No orders of the defined types were placed in this encounter.   Medications Discontinued During This Encounter  Medication Reason   albuterol (PROAIR HFA) 108 (90 Base) MCG/ACT inhaler Patient Preference   albuterol (VENTOLIN  HFA) 108 (90 Base) MCG/ACT inhaler Patient Preference     No orders of the defined types were placed in this encounter.     Objective:   VITALS: Per patient if applicable, see vitals. GENERAL: Alert and in no acute distress. CARDIOPULMONARY: No increased WOB. Speaking in clear sentences.  PSYCH: Pleasant and cooperative. Speech normal rate and rhythm. Affect is appropriate. Insight and judgement are appropriate. Attention is focused, linear, and appropriate.  NEURO: Oriented as arrived to appointment on time with no prompting.   Attestation Statements:    This was prepared with the assistance of Engineer, civil (consulting).  Occasional wrong-word or sound-a-like substitutions may have occurred due to the inherent limitations of voice recognition software.   Current Outpatient Medications on File Prior to Visit  Medication Sig Dispense Refill   anastrozole (ARIMIDEX) 1 MG tablet TAKE 1 TABLET(1 MG) BY MOUTH DAILY 90 tablet 0   atorvastatin (LIPITOR) 20 MG tablet Take 20 mg by mouth daily.     buPROPion (WELLBUTRIN XL) 300 MG 24 hr tablet Take 300 mg by mouth daily.     calcium carbonate (SUPER CALCIUM) 1500 (600 Ca) MG TABS tablet 1 tablet with meals Orally Twice a day for 30 day(s)     CALCIUM PO Take by mouth daily.      Cholecalciferol (VITAMIN D) 125 MCG (5000 UT) CAPS Take 1 capsule by mouth daily. 30 capsule 0   FLUoxetine (PROZAC) 40 MG capsule Take 40 mg by mouth daily.     fluticasone (FLONASE) 50 MCG/ACT nasal spray Place into both nostrils daily.     hydrochlorothiazide (HYDRODIURIL) 25 MG tablet Take 25 mg by mouth daily.     loratadine (CLARITIN) 10 MG tablet Take 10 mg by mouth daily.     losartan (COZAAR) 25 MG tablet Take 25 mg by mouth daily.     metFORMIN (GLUCOPHAGE) 500 MG tablet Take 1 tablet (500 mg total) by mouth 2 (two) times daily with a meal. 180 tablet 0   omeprazole (PRILOSEC) 20 MG capsule Take 20 mg by mouth daily.     OZEMPIC, 2 MG/DOSE, 8 MG/3ML SOPN  Inject into the skin.     Montoya Product (Montoya DAILY PO) Take 2 capsules by mouth daily. Jackie Montoya     No current facility-administered medications on file prior to visit.    Corinna Capra, DO

## 2023-06-24 ENCOUNTER — Inpatient Hospital Stay: Payer: Medicare Other | Attending: Hematology and Oncology | Admitting: Hematology and Oncology

## 2023-06-24 DIAGNOSIS — C50212 Malignant neoplasm of upper-inner quadrant of left female breast: Secondary | ICD-10-CM

## 2023-06-24 DIAGNOSIS — Z170 Estrogen receptor positive status [ER+]: Secondary | ICD-10-CM | POA: Diagnosis not present

## 2023-06-24 NOTE — Assessment & Plan Note (Signed)
05/28/2022 screening mammogram detected left breast asymmetry at 11 o'clock position measuring 0.6 cm.  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 XRT: Completed 08/22/2022 3.  Adjuvant antiestrogen therapy with letrozole started 12/24/2022 switching to anastrozole 05/26/2023 due to muscle aches and pains   Anastrozole toxicities:      Breast cancer surveillance: Mammogram 05/09/2023: Benign breast density category B Bone density 05/09/2023: T-score -1.6: (Used to be -0.4 in 2022) mild osteopenia

## 2023-06-26 ENCOUNTER — Telehealth: Payer: Self-pay | Admitting: Hematology and Oncology

## 2023-06-26 NOTE — Telephone Encounter (Signed)
Scheduled appointment per 7/9 los. Left voicemail for patient.

## 2023-06-30 DIAGNOSIS — M9902 Segmental and somatic dysfunction of thoracic region: Secondary | ICD-10-CM | POA: Diagnosis not present

## 2023-06-30 DIAGNOSIS — M5415 Radiculopathy, thoracolumbar region: Secondary | ICD-10-CM | POA: Diagnosis not present

## 2023-06-30 DIAGNOSIS — M62830 Muscle spasm of back: Secondary | ICD-10-CM | POA: Diagnosis not present

## 2023-06-30 DIAGNOSIS — M9903 Segmental and somatic dysfunction of lumbar region: Secondary | ICD-10-CM | POA: Diagnosis not present

## 2023-07-17 DIAGNOSIS — M9903 Segmental and somatic dysfunction of lumbar region: Secondary | ICD-10-CM | POA: Diagnosis not present

## 2023-07-17 DIAGNOSIS — M62830 Muscle spasm of back: Secondary | ICD-10-CM | POA: Diagnosis not present

## 2023-07-17 DIAGNOSIS — M5415 Radiculopathy, thoracolumbar region: Secondary | ICD-10-CM | POA: Diagnosis not present

## 2023-07-17 DIAGNOSIS — M9902 Segmental and somatic dysfunction of thoracic region: Secondary | ICD-10-CM | POA: Diagnosis not present

## 2023-07-18 DIAGNOSIS — G4733 Obstructive sleep apnea (adult) (pediatric): Secondary | ICD-10-CM | POA: Diagnosis not present

## 2023-07-21 ENCOUNTER — Encounter: Payer: Self-pay | Admitting: Bariatrics

## 2023-07-21 ENCOUNTER — Ambulatory Visit: Payer: Medicare Other | Admitting: Bariatrics

## 2023-07-21 DIAGNOSIS — Z6838 Body mass index (BMI) 38.0-38.9, adult: Secondary | ICD-10-CM

## 2023-07-21 DIAGNOSIS — R632 Polyphagia: Secondary | ICD-10-CM

## 2023-07-21 DIAGNOSIS — R7301 Impaired fasting glucose: Secondary | ICD-10-CM

## 2023-07-21 DIAGNOSIS — E669 Obesity, unspecified: Secondary | ICD-10-CM | POA: Diagnosis not present

## 2023-07-21 MED ORDER — METFORMIN HCL 500 MG PO TABS
500.0000 mg | ORAL_TABLET | Freq: Two times a day (BID) | ORAL | 0 refills | Status: DC
Start: 2023-07-21 — End: 2023-11-03

## 2023-07-21 NOTE — Progress Notes (Signed)
   WEIGHT SUMMARY AND BIOMETRICS  Weight Lost Since Last Visit: 7lb  Vitals Temp: 97.7 F (36.5 C) BP: 127/82 Pulse Rate: 86 SpO2: 97 %   Anthropometric Measurements Height: 5\' 5"  (1.651 m) Weight: 230 lb (104.3 kg) BMI (Calculated): 38.27 Weight at Last Visit: 237lb Weight Lost Since Last Visit: 7lb Starting Weight: 287lb Total Weight Loss (lbs): 57 lb (25.9 kg)   Body Composition  Body Fat %: 49.9 % Fat Mass (lbs): 114.8 lbs Muscle Mass (lbs): 109.6 lbs Total Body Water (lbs): 81.4 lbs Visceral Fat Rating : 17   Other Clinical Data Fasting: no Labs: no Today's Visit #: 83 Starting Date: 12/22/19    OBESITY Jackie Montoya is here to discuss her progress with her obesity treatment plan along with follow-up of her obesity related diagnoses.     Nutrition Plan: keeping a food journal with goal of 1300 calories and 80 grams of protein daily - 75-80% adherence.  Current exercise:  Stationary bike.  Interim History:  She is down 7 lbs since her last visit.  Not eating all of the food on the plan., Protein intake is as prescribed, and Water intake is adequate.  Pharmacotherapy: Nayma is on Ozempic 2 mg SQ weekly and Metformin 500 mg twice daily with meals Adverse side effects: None Hunger is moderately controlled.  Cravings are moderately controlled.  Assessment/Plan:   1. IFG (impaired fasting glucose)   2. Polyphagia  Macle endorses excessive hunger.  Medication(s): Ozempic and Metformin.  Effects of medication:  moderately controlled. Cravings are moderately controlled.   Plan: Medication(s): Ozempic 2 mg SQ weekly Will increase water, protein and fiber to help assuage hunger.  Will minimize foods that have a high glucose index/load to minimize reactive hypoglycemia.    Generalized Obesity: Current BMI BMI (Calculated): 38.27   Pharmacotherapy Plan Continue and refill  Metformin 500 mg twice daily with meals  [name] is currently in the action  stage of change. As such, her goal is to continue with weight loss efforts.  She has agreed to keeping a food journal with goal of 1,300 calories and 80 grams of protein daily.  Exercise goals: Older adults should determine their level of effort for physical activity relative to their level of fitness.   Behavioral modification strategies: increasing lean protein intake, meal planning , planning for success, and keep healthy foods in the home.  [name] has agreed to follow-up with our clinic in 4 weeks.    Objective:   VITALS: Per patient if applicable, see vitals. GENERAL: Alert and in no acute distress. CARDIOPULMONARY: No increased WOB. Speaking in clear sentences.  PSYCH: Pleasant and cooperative. Speech normal rate and rhythm. Affect is appropriate. Insight and judgement are appropriate. Attention is focused, linear, and appropriate.  NEURO: Oriented as arrived to appointment on time with no prompting.   Attestation Statements:    This was prepared with the assistance of Engineer, civil (consulting).  Occasional wrong-word or sound-a-like substitutions may have occurred due to the inherent limitations of voice recognition software.   Corinna Capra, DO

## 2023-07-22 NOTE — Progress Notes (Signed)
HEMATOLOGY-ONCOLOGY TELEPHONE VISIT PROGRESS NOTE  I connected with our patient on 07/24/23 at  3:00 PM EDT by telephone and verified that I am speaking with the correct person using two identifiers.  I discussed the limitations, risks, security and privacy concerns of performing an evaluation and management service by telephone and the availability of in person appointments.  I also discussed with the patient that there may be a patient responsible charge related to this service. The patient expressed understanding and agreed to proceed.   History of Present Illness: Jackie Montoya is a 72 y.o. female is here because of recent diagnosis of left breast cancer. Currently on anastrozole.. She presents to the clinic today for a telephone follow-up.  Patient tells me that the body aches and pain did not improve with reducing the dosage of anastrozole.  Oncology History  Malignant neoplasm of upper-inner quadrant of left breast in female, estrogen receptor positive (HCC)  06/03/2022 Initial Diagnosis   Malignant neoplasm of upper-inner quadrant of left breast in female, estrogen receptor positive (HCC)   06/05/2022 Cancer Staging   Staging form: Breast, AJCC 8th Edition - Clinical: Stage IA (cT1b, cN0, cM0, G1, ER+, PR+, HER2-) - Signed by Serena Croissant, MD on 06/05/2022 Stage prefix: Initial diagnosis Histologic grading system: 3 grade system   06/12/2022 Genetic Testing   Negative hereditary cancer genetic testing: no pathogenic variants detected in [facility] Panel.  Report date is June 12, 2022.   The BRCAplus panel offered by [employer] and includes sequencing and deletion/duplication analysis for the following 8 genes: ATM, BRCA1, BRCA2, CDH1, CHEK2, PALB2, PTEN, and TP53.  Pan-cancer panel is pending.    06/27/2022 Surgery    Left lumpectomy 06/27/2022: Grade 1 IDC 0.6 cm with intermediate grade DCIS, margins negative    07/31/2022 - 08/22/2022 Radiation Therapy   Site Technique Total  Dose (Gy) Dose per Fx (Gy) Completed Fx Beam Energies  Breast, Left: Breast_L_axilla 3D 40.05/40.05 2.67 15/15 15X, 10XFFF     09/2022 -  Anti-estrogen oral therapy   2.5 mg Letrozole x 5 years     REVIEW OF SYSTEMS:   Constitutional: Denies fevers, chills or abnormal weight loss Complains of diffuse muscle aches and pains All other systems were reviewed with the patient and are negative. Observations/Objective:     Assessment Plan:  Malignant neoplasm of upper-inner quadrant of left breast in female, estrogen receptor positive (HCC) 05/28/2022 screening mammogram detected left breast asymmetry at 11 o'clock position measuring 0.6 cm.  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 XRT: Completed 08/22/2022 3.  Adjuvant antiestrogen therapy with letrozole started 12/24/2022 switching to anastrozole 05/26/2023 due to muscle aches and pains   Anastrozole toxicities:  Lower leg still aching and hip and lower back (even walking causes pain) Instructed the patient to reduce the dosage and half and take magnesium supplementation over-the-counter.  The pains did not improve with Half dose. Instructed to stop it completely for a month and see if it makes a difference    Breast cancer surveillance: Mammogram 05/09/2023: Benign breast density category B Bone density 05/09/2023: T-score -1.6: (Used to be -0.4 in 2022) mild osteopenia   Telephone visit in 1 month to discuss her symptoms with stoppage of antiestrogen therapy. If her symptoms improved then we can either talk about  switching to tamoxifen versus surveillance.   I discussed the assessment and treatment plan with the patient. The patient was provided an opportunity to ask questions and all were answered. The patient agreed with the  plan and demonstrated an understanding of the instructions. The patient was advised to call back or seek an in-person evaluation if the symptoms worsen or if the condition fails to improve as anticipated.   I provided 12 minutes of non-face-to-face time during this encounter.  This includes time for charting and coordination of care   Tamsen Meek, MD  I Janan Ridge am acting as a scribe for Dr.Vinay Gudena  I have reviewed the above documentation for accuracy and completeness, and I agree with the above.

## 2023-07-24 ENCOUNTER — Inpatient Hospital Stay: Payer: Medicare Other | Attending: Hematology and Oncology | Admitting: Hematology and Oncology

## 2023-07-24 DIAGNOSIS — Z170 Estrogen receptor positive status [ER+]: Secondary | ICD-10-CM

## 2023-07-24 DIAGNOSIS — C50212 Malignant neoplasm of upper-inner quadrant of left female breast: Secondary | ICD-10-CM | POA: Diagnosis not present

## 2023-07-24 NOTE — Assessment & Plan Note (Signed)
05/28/2022 screening mammogram detected left breast asymmetry at 11 o'clock position measuring 0.6 cm.  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 XRT: Completed 08/22/2022 3.  Adjuvant antiestrogen therapy with letrozole started 12/24/2022 switching to anastrozole 05/26/2023 due to muscle aches and pains   Anastrozole toxicities:  Lower leg still aching and hip and lower back (even walking causes pain) Instructed the patient to reduce the dosage and half and take magnesium supplementation over-the-counter.     Breast cancer surveillance: Mammogram 05/09/2023: Benign breast density category B Bone density 05/09/2023: T-score -1.6: (Used to be -0.4 in 2022) mild osteopenia   Telephone visit in 1 month to discuss tolerance to lower dosage of anastrozole and magnesium

## 2023-07-25 ENCOUNTER — Telehealth: Payer: Self-pay | Admitting: Hematology and Oncology

## 2023-07-25 NOTE — Telephone Encounter (Signed)
Contacted patient to scheduled appointments. Left message with appointment details and a call back number if patient had any questions or could not accommodate the time we provided.   

## 2023-08-04 DIAGNOSIS — M9903 Segmental and somatic dysfunction of lumbar region: Secondary | ICD-10-CM | POA: Diagnosis not present

## 2023-08-04 DIAGNOSIS — M62830 Muscle spasm of back: Secondary | ICD-10-CM | POA: Diagnosis not present

## 2023-08-04 DIAGNOSIS — M5415 Radiculopathy, thoracolumbar region: Secondary | ICD-10-CM | POA: Diagnosis not present

## 2023-08-04 DIAGNOSIS — M9902 Segmental and somatic dysfunction of thoracic region: Secondary | ICD-10-CM | POA: Diagnosis not present

## 2023-08-19 ENCOUNTER — Ambulatory Visit (INDEPENDENT_AMBULATORY_CARE_PROVIDER_SITE_OTHER): Payer: Medicare Other | Admitting: Bariatrics

## 2023-08-19 ENCOUNTER — Encounter: Payer: Self-pay | Admitting: Bariatrics

## 2023-08-19 VITALS — BP 128/73 | HR 79 | Temp 98.2°F | Ht 65.0 in | Wt 228.0 lb

## 2023-08-19 DIAGNOSIS — Z6837 Body mass index (BMI) 37.0-37.9, adult: Secondary | ICD-10-CM | POA: Diagnosis not present

## 2023-08-19 DIAGNOSIS — E88819 Insulin resistance, unspecified: Secondary | ICD-10-CM | POA: Diagnosis not present

## 2023-08-19 DIAGNOSIS — R632 Polyphagia: Secondary | ICD-10-CM

## 2023-08-19 DIAGNOSIS — E669 Obesity, unspecified: Secondary | ICD-10-CM | POA: Diagnosis not present

## 2023-08-19 NOTE — Progress Notes (Signed)
WEIGHT SUMMARY AND BIOMETRICS  Weight Lost Since Last Visit: 2lb  Vitals Temp: 98.2 F (36.8 C) BP: 128/73 Pulse Rate: 79 SpO2: 97 %   Anthropometric Measurements Height: 5\' 5"  (1.651 m) Weight: 228 lb (103.4 kg) BMI (Calculated): 37.94 Weight at Last Visit: 230lb Weight Lost Since Last Visit: 2lb Starting Weight: 287lb Total Weight Loss (lbs): 59 lb (26.8 kg)   Body Composition  Body Fat %: 50.3 % Fat Mass (lbs): 115 lbs Muscle Mass (lbs): 108 lbs Total Body Water (lbs): 82.4 lbs Visceral Fat Rating : 17   Other Clinical Data Fasting: no Labs: no Today's Visit #: 64 Starting Date: 12/22/19    OBESITY Jackie Montoya is here to discuss her progress with her obesity treatment plan along with follow-up of her obesity related diagnoses.     Nutrition Plan: keeping a food journal with goal of 1300 calories and 80 grams of protein daily - 75% adherence.  Current exercise: yard work and Jackie Montoya  Interim History:  She is down 2 lbs Eating all of the food on the plan., Protein intake is as prescribed, Meeting protein goals., and Water intake is adequate.  Pharmacotherapy: Jackie Montoya is on Metformin 500 mg twice daily with meals Adverse side effects: None Hunger is moderately controlled.  Cravings are moderately controlled.  Assessment/Plan:    Jackie Montoya endorses excessive hunger.  Medication(s): Metformin and Jackie Montoya Effects of medication:  moderately controlled. Cravings are moderately controlled.   Plan: Medication(s): Jackie Montoya 2 mg SQ weekly Will increase water, protein and fiber to help assuage hunger.  Will minimize foods that have a high glucose index/load to minimize reactive hypoglycemia.   Insulin Resistance Jackie Montoya has had elevated fasting insulin readings. Goal is HgbA1c < 5.7, fasting insulin at l0 or less, and preferably at 5.  She denies polyphagia. Medication(s): Jackie Montoya Lab Results  Component Value Date   HGBA1C 5.2 04/23/2023   Lab  Results  Component Value Date   INSULIN 5.3 04/23/2023   INSULIN 10.8 07/11/2022   INSULIN 11.6 01/22/2022   INSULIN 7.3 07/11/2021   INSULIN 10.2 11/02/2020    Plan Medication(s): Jackie Montoya 2 mg SQ weekly Will work on the agreed upon plan. Will minimize refined carbohydrates ( sweets and starches), and focus more on complex carbohydrates.  Increase the micronutrients found in leafy greens, which include magnesium, polyphenols, and vitamin C which have been postulated to help with insulin sensitivity. Minimize "fast food" and cook more meals at home.  Will continue his exercise regimen.     Generalized Obesity: Current BMI BMI (Calculated): 37.94   Pharmacotherapy Plan Continue  Jackie Montoya 2 mg SQ weekly  Shwetha is currently in the action stage of change. As such, her goal is to continue with weight loss efforts.  She has agreed to keeping a food journal with goal of 1,300 calories and 80 grams of protein daily.  Exercise goals: Older adults should determine their level of effort for physical activity relative to their level of fitness.   Behavioral modification strategies: increasing lean protein intake, decreasing simple carbohydrates , no meal skipping, meal planning , increase water intake, better snacking choices, planning for success, get rid of junk food in the home, and keep healthy foods in the home.  Hasti has agreed to follow-up with our clinic in 5 weeks.       Objective:   VITALS: Per patient if applicable, see vitals. GENERAL: Alert and in no acute distress. CARDIOPULMONARY: No increased WOB. Speaking in clear sentences.  PSYCH: Pleasant  and cooperative. Speech normal rate and rhythm. Affect is appropriate. Insight and judgement are appropriate. Attention is focused, linear, and appropriate.  NEURO: Oriented as arrived to appointment on time with no prompting.   Attestation Statements:    This was prepared with the assistance of Engineer, civil (consulting).   Occasional wrong-word or sound-a-like substitutions may have occurred due to the inherent limitations of voice recognition software. Corinna Capra, DO

## 2023-08-22 DIAGNOSIS — Z23 Encounter for immunization: Secondary | ICD-10-CM | POA: Diagnosis not present

## 2023-08-25 DIAGNOSIS — M62830 Muscle spasm of back: Secondary | ICD-10-CM | POA: Diagnosis not present

## 2023-08-25 DIAGNOSIS — M5415 Radiculopathy, thoracolumbar region: Secondary | ICD-10-CM | POA: Diagnosis not present

## 2023-08-25 DIAGNOSIS — M9903 Segmental and somatic dysfunction of lumbar region: Secondary | ICD-10-CM | POA: Diagnosis not present

## 2023-08-25 DIAGNOSIS — M9902 Segmental and somatic dysfunction of thoracic region: Secondary | ICD-10-CM | POA: Diagnosis not present

## 2023-08-26 ENCOUNTER — Inpatient Hospital Stay: Payer: Medicare Other | Attending: Hematology and Oncology | Admitting: Hematology and Oncology

## 2023-08-26 DIAGNOSIS — Z170 Estrogen receptor positive status [ER+]: Secondary | ICD-10-CM

## 2023-08-26 DIAGNOSIS — C50212 Malignant neoplasm of upper-inner quadrant of left female breast: Secondary | ICD-10-CM

## 2023-08-26 NOTE — Progress Notes (Signed)
HEMATOLOGY-ONCOLOGY TELEPHONE VISIT PROGRESS NOTE  I connected with our patient on 08/26/23 at  8:00 AM EDT by telephone and verified that I am speaking with the correct person using two identifiers.  I discussed the limitations, risks, security and privacy concerns of performing an evaluation and management service by telephone and the availability of in person appointments.  I also discussed with the patient that there may be a patient responsible charge related to this service. The patient expressed understanding and agreed to proceed.   History of Present Illness: Follow-up to discuss how she felt after stopping anastrozole.  She reports that the joint aches and pains have significantly improved to the point that she is able to walk.  Previously she was even unable to walk.  However she is not fully better and continues to have aches and pains throughout her body.  Oncology History  Malignant neoplasm of upper-inner quadrant of left breast in female, estrogen receptor positive (HCC)  06/03/2022 Initial Diagnosis   Malignant neoplasm of upper-inner quadrant of left breast in female, estrogen receptor positive (HCC)   06/05/2022 Cancer Staging   Staging form: Breast, AJCC 8th Edition - Clinical: Stage IA (cT1b, cN0, cM0, G1, ER+, PR+, HER2-) - Signed by Serena Croissant, MD on 06/05/2022 Stage prefix: Initial diagnosis Histologic grading system: 3 grade system   06/12/2022 Genetic Testing   Negative hereditary cancer genetic testing: no pathogenic variants detected in [facility] Panel.  Report date is June 12, 2022.   The BRCAplus panel offered by [employer] and includes sequencing and deletion/duplication analysis for the following 8 genes: ATM, BRCA1, BRCA2, CDH1, CHEK2, PALB2, PTEN, and TP53.  Pan-cancer panel is pending.    06/27/2022 Surgery    Left lumpectomy 06/27/2022: Grade 1 IDC 0.6 cm with intermediate grade DCIS, margins negative    07/31/2022 - 08/22/2022 Radiation Therapy    Site Technique Total Dose (Gy) Dose per Fx (Gy) Completed Fx Beam Energies  Breast, Left: Breast_L_axilla 3D 40.05/40.05 2.67 15/15 15X, 10XFFF     09/2022 -  Anti-estrogen oral therapy   2.5 mg Letrozole x 5 years     REVIEW OF SYSTEMS:   Constitutional: Denies fevers, chills or abnormal weight loss All other systems were reviewed with the patient and are negative. Observations/Objective:     Assessment Plan:  Malignant neoplasm of upper-inner quadrant of left breast in female, estrogen receptor positive (HCC) 05/28/2022 screening mammogram detected left breast asymmetry at 11 o'clock position measuring 0.6 cm.  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 XRT: Completed 08/22/2022 3.  Adjuvant antiestrogen therapy with letrozole started 12/24/2022 switching to anastrozole 05/26/2023 due to muscle aches and pains discontinued 07/24/2023 (leg pains)   Anastrozole toxicities:  Lower leg still aching and hip and lower back (even walking causes pain) Discontinued anastrozole 07/24/2023 Joint pains have improved significantly but not fully gone away. We discussed the role of tamoxifen as another option.  Patient would like to stay off antiestrogen therapy for a little while longer before making a decision.   Breast cancer surveillance: Mammogram 05/09/2023: Benign breast density category B Bone density 05/09/2023: T-score -1.6: (Used to be -0.4 in 2022) mild osteopenia   Return to clinic in 6 months for a follow-up to make a final decision regarding tamoxifen therapy.   I discussed  the assessment and treatment plan with the patient. The patient was provided an opportunity to ask questions and all were answered. The patient agreed with the plan and demonstrated an understanding of the  instructions. The patient was advised to call back or seek an in-person evaluation if the symptoms worsen or if the condition fails to improve as anticipated.   I provided 12 minutes of non-face-to-face time during this encounter.  This includes time for charting and coordination of care   Tamsen Meek, MD

## 2023-08-26 NOTE — Assessment & Plan Note (Signed)
05/28/2022 screening mammogram detected left breast asymmetry at 11 o'clock position measuring 0.6 cm.  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 XRT: Completed 08/22/2022 3.  Adjuvant antiestrogen therapy with letrozole started 12/24/2022 switching to anastrozole 05/26/2023 due to muscle aches and pains discontinued 07/24/2023 (leg pains)   Anastrozole toxicities:  Lower leg still aching and hip and lower back (even walking causes pain) Discontinued anastrozole 07/24/2023   Breast cancer surveillance: Mammogram 05/09/2023: Benign breast density category B Bone density 05/09/2023: T-score -1.6: (Used to be -0.4 in 2022) mild osteopenia   Telephone visit in 1 month to discuss her symptoms with stoppage of antiestrogen therapy. If her symptoms improved then we can either talk about switching to tamoxifen versus surveillance.

## 2023-09-04 DIAGNOSIS — M5415 Radiculopathy, thoracolumbar region: Secondary | ICD-10-CM | POA: Diagnosis not present

## 2023-09-04 DIAGNOSIS — M9902 Segmental and somatic dysfunction of thoracic region: Secondary | ICD-10-CM | POA: Diagnosis not present

## 2023-09-04 DIAGNOSIS — M62830 Muscle spasm of back: Secondary | ICD-10-CM | POA: Diagnosis not present

## 2023-09-04 DIAGNOSIS — M9903 Segmental and somatic dysfunction of lumbar region: Secondary | ICD-10-CM | POA: Diagnosis not present

## 2023-09-18 DIAGNOSIS — M9902 Segmental and somatic dysfunction of thoracic region: Secondary | ICD-10-CM | POA: Diagnosis not present

## 2023-09-18 DIAGNOSIS — M9903 Segmental and somatic dysfunction of lumbar region: Secondary | ICD-10-CM | POA: Diagnosis not present

## 2023-09-18 DIAGNOSIS — M5415 Radiculopathy, thoracolumbar region: Secondary | ICD-10-CM | POA: Diagnosis not present

## 2023-09-18 DIAGNOSIS — M62830 Muscle spasm of back: Secondary | ICD-10-CM | POA: Diagnosis not present

## 2023-09-22 ENCOUNTER — Ambulatory Visit (INDEPENDENT_AMBULATORY_CARE_PROVIDER_SITE_OTHER): Payer: Medicare Other | Admitting: Bariatrics

## 2023-09-22 ENCOUNTER — Encounter: Payer: Self-pay | Admitting: Bariatrics

## 2023-09-22 VITALS — BP 130/75 | HR 82 | Temp 98.3°F | Ht 65.0 in | Wt 228.0 lb

## 2023-09-22 DIAGNOSIS — E669 Obesity, unspecified: Secondary | ICD-10-CM

## 2023-09-22 DIAGNOSIS — E66812 Obesity, class 2: Secondary | ICD-10-CM

## 2023-09-22 DIAGNOSIS — E88819 Insulin resistance, unspecified: Secondary | ICD-10-CM

## 2023-09-22 DIAGNOSIS — R632 Polyphagia: Secondary | ICD-10-CM

## 2023-09-22 DIAGNOSIS — Z6837 Body mass index (BMI) 37.0-37.9, adult: Secondary | ICD-10-CM

## 2023-09-22 MED ORDER — OZEMPIC (2 MG/DOSE) 8 MG/3ML ~~LOC~~ SOPN: PEN_INJECTOR | SUBCUTANEOUS | 0 refills | Status: DC

## 2023-09-22 NOTE — Progress Notes (Signed)
WEIGHT SUMMARY AND BIOMETRICS  Weight Lost Since Last Visit: 0  Weight Gained Since Last Visit: 0   Vitals Temp: 98.3 F (36.8 C) BP: 130/75 Pulse Rate: 82 SpO2: 97 %   Anthropometric Measurements Height: 5\' 5"  (1.651 m) Weight: 228 lb (103.4 kg) BMI (Calculated): 37.94 Weight at Last Visit: 228lb Weight Lost Since Last Visit: 0 Weight Gained Since Last Visit: 0 Starting Weight: 287lb Total Weight Loss (lbs): 59 lb (26.8 kg)   Body Composition  Body Fat %: 46.4 % Fat Mass (lbs): 106 lbs Muscle Mass (lbs): 116.2 lbs Total Body Water (lbs): 89.2 lbs Visceral Fat Rating : 16   Other Clinical Data Fasting: no Labs: no Today's Visit #: 42 Starting Date: 12/22/19    OBESITY Jackie Montoya is here to discuss her progress with her obesity treatment plan along with follow-up of her obesity related diagnoses.     Nutrition Plan: keeping a food journal with goal of 1300 calories and 80 grams of protein daily - 70-75% adherence.  Current exercise: walking and yard work  Interim History:  Her weight remains the same. She is doing well overall. Her body fat is down and her water weight is up.  Eating all of the food on the plan., Protein intake is as prescribed, and Water intake is inadequate.  Pharmacotherapy: Jackie Montoya is on Ozempic 2 mg SQ weekly Adverse side effects: None Hunger is moderately controlled.  Cravings are moderately controlled.  Assessment/Plan:   Jackie Montoya endorses excessive hunger.  Medication(s): Ozempic, Metformin Effects of medication:  moderately controlled. Cravings are moderately controlled.   Plan: Medication(s): Ozempic 2 mg SQ weekly Will increase water, protein and fiber to help assuage hunger.  Will minimize foods that have a high glucose index/load to minimize reactive hypoglycemia.   Insulin Resistance Jackie Montoya has had elevated fasting insulin readings. Goal is HgbA1c < 5.7, fasting insulin at l0 or less, and preferably at  5.  She denies polyphagia. Medication(s): Ozempia Lab Results  Component Value Date   HGBA1C 5.2 04/23/2023   Lab Results  Component Value Date   INSULIN 5.3 04/23/2023   INSULIN 10.8 07/11/2022   INSULIN 11.6 01/22/2022   INSULIN 7.3 07/11/2021   INSULIN 10.2 11/02/2020    Plan Medication(s): Ozempic 2 mg SQ weekly Will work on the agreed upon plan. Will minimize refined carbohydrates ( sweets and starches), and focus more on complex carbohydrates.  Increase the micronutrients found in leafy greens, which include magnesium, polyphenols, and vitamin C which have been postulated to help with insulin sensitivity. Minimize "fast food" and cook more meals at home.  Increase fiber to 25 to 30 grams daily.  Will increase her water.     Generalized Obesity: Current BMI BMI (Calculated): 37.94   Pharmacotherapy Plan Continue and refill  Ozempic 2 mg SQ weekly  Jackie Montoya is currently in the action stage of change. As such, her goal is to continue with weight loss efforts.  She has agreed to 1,300 calories and 80 grams of protein. Marland Kitchen  Exercise goals: Older adults should determine their level of effort for physical activity relative to their level of fitness.   Behavioral modification strategies: no meal skipping, meal planning , increase water intake, better snacking choices, planning for success, get rid of junk food in the home, and keep healthy foods in the home.  Jackie Montoya has agreed to follow-up with our clinic in 6 weeks.     Medications Discontinued During This Encounter  Medication Reason   anastrozole (  ARIMIDEX) 1 MG tablet Patient Preference         Objective:   VITALS: Per patient if applicable, see vitals. GENERAL: Alert and in no acute distress. CARDIOPULMONARY: No increased WOB. Speaking in clear sentences.  PSYCH: Pleasant and cooperative. Speech normal rate and rhythm. Affect is appropriate. Insight and judgement are appropriate. Attention is focused, linear, and  appropriate.  NEURO: Oriented as arrived to appointment on time with no prompting.   Attestation Statements:   This was prepared with the assistance of Engineer, civil (consulting).  Occasional wrong-word or sound-a-like substitutions may have occurred due to the inherent limitations of voice recognition software. Jackie Capra, DO

## 2023-10-03 DIAGNOSIS — M62830 Muscle spasm of back: Secondary | ICD-10-CM | POA: Diagnosis not present

## 2023-10-03 DIAGNOSIS — M9903 Segmental and somatic dysfunction of lumbar region: Secondary | ICD-10-CM | POA: Diagnosis not present

## 2023-10-03 DIAGNOSIS — M9902 Segmental and somatic dysfunction of thoracic region: Secondary | ICD-10-CM | POA: Diagnosis not present

## 2023-10-03 DIAGNOSIS — M5415 Radiculopathy, thoracolumbar region: Secondary | ICD-10-CM | POA: Diagnosis not present

## 2023-10-08 DIAGNOSIS — L814 Other melanin hyperpigmentation: Secondary | ICD-10-CM | POA: Diagnosis not present

## 2023-10-08 DIAGNOSIS — L304 Erythema intertrigo: Secondary | ICD-10-CM | POA: Diagnosis not present

## 2023-10-08 DIAGNOSIS — D2262 Melanocytic nevi of left upper limb, including shoulder: Secondary | ICD-10-CM | POA: Diagnosis not present

## 2023-10-08 DIAGNOSIS — D2271 Melanocytic nevi of right lower limb, including hip: Secondary | ICD-10-CM | POA: Diagnosis not present

## 2023-10-08 DIAGNOSIS — D1801 Hemangioma of skin and subcutaneous tissue: Secondary | ICD-10-CM | POA: Diagnosis not present

## 2023-10-08 DIAGNOSIS — L245 Irritant contact dermatitis due to other chemical products: Secondary | ICD-10-CM | POA: Diagnosis not present

## 2023-10-08 DIAGNOSIS — L821 Other seborrheic keratosis: Secondary | ICD-10-CM | POA: Diagnosis not present

## 2023-10-09 DIAGNOSIS — J45909 Unspecified asthma, uncomplicated: Secondary | ICD-10-CM | POA: Diagnosis not present

## 2023-10-09 DIAGNOSIS — F39 Unspecified mood [affective] disorder: Secondary | ICD-10-CM | POA: Diagnosis not present

## 2023-10-09 DIAGNOSIS — F419 Anxiety disorder, unspecified: Secondary | ICD-10-CM | POA: Diagnosis not present

## 2023-10-09 DIAGNOSIS — I10 Essential (primary) hypertension: Secondary | ICD-10-CM | POA: Diagnosis not present

## 2023-10-09 DIAGNOSIS — M5432 Sciatica, left side: Secondary | ICD-10-CM | POA: Diagnosis not present

## 2023-10-09 DIAGNOSIS — K219 Gastro-esophageal reflux disease without esophagitis: Secondary | ICD-10-CM | POA: Diagnosis not present

## 2023-10-09 DIAGNOSIS — R7303 Prediabetes: Secondary | ICD-10-CM | POA: Diagnosis not present

## 2023-10-09 DIAGNOSIS — Z0000 Encounter for general adult medical examination without abnormal findings: Secondary | ICD-10-CM | POA: Diagnosis not present

## 2023-10-09 DIAGNOSIS — F900 Attention-deficit hyperactivity disorder, predominantly inattentive type: Secondary | ICD-10-CM | POA: Diagnosis not present

## 2023-10-09 DIAGNOSIS — M1990 Unspecified osteoarthritis, unspecified site: Secondary | ICD-10-CM | POA: Diagnosis not present

## 2023-10-09 DIAGNOSIS — E782 Mixed hyperlipidemia: Secondary | ICD-10-CM | POA: Diagnosis not present

## 2023-10-09 DIAGNOSIS — M720 Palmar fascial fibromatosis [Dupuytren]: Secondary | ICD-10-CM | POA: Diagnosis not present

## 2023-10-17 DIAGNOSIS — M9902 Segmental and somatic dysfunction of thoracic region: Secondary | ICD-10-CM | POA: Diagnosis not present

## 2023-10-17 DIAGNOSIS — M9903 Segmental and somatic dysfunction of lumbar region: Secondary | ICD-10-CM | POA: Diagnosis not present

## 2023-10-17 DIAGNOSIS — M62830 Muscle spasm of back: Secondary | ICD-10-CM | POA: Diagnosis not present

## 2023-10-17 DIAGNOSIS — M5415 Radiculopathy, thoracolumbar region: Secondary | ICD-10-CM | POA: Diagnosis not present

## 2023-10-18 ENCOUNTER — Emergency Department (HOSPITAL_BASED_OUTPATIENT_CLINIC_OR_DEPARTMENT_OTHER): Payer: Medicare Other

## 2023-10-18 ENCOUNTER — Emergency Department (HOSPITAL_BASED_OUTPATIENT_CLINIC_OR_DEPARTMENT_OTHER)
Admission: EM | Admit: 2023-10-18 | Discharge: 2023-10-18 | Disposition: A | Discharge status: Home / Self Care | Payer: Medicare Other | Attending: Emergency Medicine | Admitting: Emergency Medicine

## 2023-10-18 ENCOUNTER — Encounter (HOSPITAL_BASED_OUTPATIENT_CLINIC_OR_DEPARTMENT_OTHER): Payer: Self-pay | Admitting: Emergency Medicine

## 2023-10-18 DIAGNOSIS — M5432 Sciatica, left side: Secondary | ICD-10-CM | POA: Diagnosis not present

## 2023-10-18 DIAGNOSIS — Z794 Long term (current) use of insulin: Secondary | ICD-10-CM | POA: Diagnosis not present

## 2023-10-18 DIAGNOSIS — M7989 Other specified soft tissue disorders: Secondary | ICD-10-CM | POA: Insufficient documentation

## 2023-10-18 DIAGNOSIS — M79605 Pain in left leg: Secondary | ICD-10-CM | POA: Diagnosis not present

## 2023-10-18 DIAGNOSIS — M79662 Pain in left lower leg: Secondary | ICD-10-CM | POA: Diagnosis not present

## 2023-10-18 DIAGNOSIS — M25562 Pain in left knee: Secondary | ICD-10-CM | POA: Diagnosis not present

## 2023-10-18 DIAGNOSIS — M7122 Synovial cyst of popliteal space [Baker], left knee: Secondary | ICD-10-CM | POA: Diagnosis not present

## 2023-10-18 NOTE — ED Triage Notes (Signed)
Pt c/o LLE pain with tenderness x 3 days. Reports posterior knee swelling. Referred by UC. Pt denies shob

## 2023-10-18 NOTE — Discharge Instructions (Signed)
It was our pleasure to provide your ER care today - we hope that you feel better.  Follow up with your doctor in the next 1-2 weeks.  Return to ER if worse, new symptoms, acutely worsening pain/swelling, redness, fevers, or other concern.

## 2023-10-18 NOTE — ED Provider Notes (Signed)
[facility] Provider Note   CSN: [id number] Arrival date & time: 10/18/23  1144     History  Chief Complaint  Patient presents with   Leg Pain    Jackie Montoya is a 72 y.o. female.  Pt with c/o pain behind left knee in past three days. Denies recent injury or strain. Indicates has hx sciatica on chronic, recurrent basis, but feels this is different. No new leg swelling. No hx dvt or pe. No hx bakers cyst. No chest pain or sob. No fever or chills. No skin lesions or rash in area of pain. Saw pcp and was sent to ED.   The history is provided by the patient and medical records.  Leg Pain Associated symptoms: no fever        Home Medications Prior to Admission medications   Medication Sig Start Date End Date Taking? Authorizing Provider  atorvastatin (LIPITOR) 20 MG tablet Take 20 mg by mouth daily.    [provider]  buPROPion (WELLBUTRIN XL) 300 MG 24 hr tablet Take 300 mg by mouth daily.    [provider]  calcium carbonate (SUPER CALCIUM) 1500 (600 Ca) MG TABS tablet 1 tablet with meals Orally Twice a day for 30 day(s)    [provider]  CALCIUM PO Take by mouth daily.     [provider]  Cholecalciferol (VITAMIN D) 125 MCG (5000 UT) CAPS Take 1 capsule by mouth daily. 11/02/20   Whitmire, Dawn, FNP  FLUoxetine (PROZAC) 40 MG capsule Take 40 mg by mouth daily.    [provider]  fluticasone (FLONASE) 50 MCG/ACT nasal spray Place into both nostrils daily.    [provider]  hydrochlorothiazide (HYDRODIURIL) 25 MG tablet Take 25 mg by mouth daily.    [provider]  loratadine (CLARITIN) 10 MG tablet Take 10 mg by mouth daily.    [provider]  losartan (COZAAR) 25 MG tablet Take 25 mg by mouth daily.    [provider]  metFORMIN (GLUCOPHAGE) 500 MG tablet Take 1 tablet (500 mg total) by mouth 2 (two) times daily with a meal. 07/21/23   Corinna Capra A, DO  omeprazole (PRILOSEC) 20 MG capsule Take 20 mg by mouth daily.    [provider]  OZEMPIC, 2 MG/DOSE, 8 MG/3ML SOPN Into the skin weekly 09/22/23   Corinna Capra A, DO  Probiotic Product (PROBIOTIC DAILY PO) Take 2 capsules by mouth daily. Emma Probiotic    [provider]      Allergies    Codeine    Review of Systems   Review of Systems  Constitutional:  Negative for chills and fever.  Respiratory:  Negative for shortness of breath.   Cardiovascular:  Negative for chest pain.  Musculoskeletal:        Pain behind left knee.   Skin:  Negative for rash and wound.  Neurological:  Negative for weakness and numbness.    Physical Exam Updated Vital Signs BP (!) 141/80   Pulse 87   Temp 99 F (37.2 C) (Oral)   Resp 16   Wt 104.3 kg   LMP 06/15/2002   SpO2 98%   BMI 38.27 kg/m  Physical Exam Vitals and nursing note reviewed.  Constitutional:      Appearance: Normal appearance. She is well-developed.  HENT:     Head: Atraumatic.     Nose: Nose normal.  Eyes:     General: No  scleral icterus.    Conjunctiva/sclera: Conjunctivae normal.  Neck:     Trachea: No tracheal deviation.  Cardiovascular:     Rate and Rhythm: Normal rate and regular rhythm.     Pulses: Normal pulses.     Heart sounds: Normal heart sounds. No murmur heard.    No friction rub. No gallop.  Pulmonary:     Effort: Pulmonary effort is normal. No respiratory distress.     Breath sounds: Normal breath sounds.  Abdominal:     Tenderness: There is no guarding.  Musculoskeletal:        General: No swelling.     Cervical back: Neck supple. No muscular tenderness.     Comments: Good passive rom at left hip, knee and ankle without pain. No LLE swelling. LLE is of normal color and warmth. Left knee grossly stable. No effusion. No focal mass felt. No erythema or infection noted.   Skin:    General: Skin is warm and dry.     Findings: No rash.  Neurological:     Mental Status:  She is alert.     Comments: Alert, speech normal. Straight leg raise is negative. LLE nvi. Steady gait.   Psychiatric:        Mood and Affect: Mood normal.     ED Results / Procedures / Treatments   Labs (all labs ordered are listed, but only abnormal results are displayed) Labs Reviewed - No data to display  EKG None  Radiology US Venous Img Lower  Left (DVT Study)  Result Date: 10/18/2023 CLINICAL DATA:  Left knee pain radiating to calf EXAM: LEFT LOWER EXTREMITY VENOUS DOPPLER ULTRASOUND TECHNIQUE: Gray-scale sonography with compression, as well as color and duplex ultrasound, were performed to evaluate the deep venous system(s) from the level of the common femoral vein through the popliteal and proximal calf veins. COMPARISON:  None Available. FINDINGS: VENOUS Normal compressibility of the common femoral, superficial femoral, and popliteal veins, as well as the visualized calf veins. Visualized portions of profunda femoral vein and great saphenous vein unremarkable. No filling defects to suggest DVT on grayscale or color Doppler imaging. Doppler waveforms show normal direction of venous flow, normal respiratory plasticity and response to augmentation. Limited views of the contralateral common femoral vein are unremarkable. OTHER Irregular fluid collection of the popliteal fossa measuring 6.0 x 2.0 x 2.0 cm. Limitations: none IMPRESSION: 1. No evidence of DVT in the left lower extremity. 2. Irregular fluid collection of the popliteal fossa, likely a ruptured Baker's cyst. Electronically Signed   By: Allegra Lai M.D.   On: 10/18/2023 13:32    Procedures Procedures    Medications Ordered in ED Medications - No data to display  ED Course/ Medical Decision Making/ A&P                                 Medical Decision Making Problems Addressed: Baker's cyst of knee, left: acute illness or injury  Amount and/or Complexity of Data Reviewed External Data Reviewed:  notes. Radiology: ordered and independent interpretation performed. Decision-making details documented in ED Course.   U/s imaging ordered.  Reviewed nursing notes and prior charts for additional history.   Imaging reviewed/interpreted by me - bakers cyst, ?ruptured. No dvt.   Pt appears stable for d/c.           Final Clinical Impression(s) / ED Diagnoses Final diagnoses:  Baker's cyst of knee, left  Rx / DC Orders ED Discharge Orders     None         Cathren Laine, MD 10/18/23 1339

## 2023-10-18 NOTE — ED Notes (Signed)
Discharge paperwork reviewed entirely with patient, including follow up care. Pain was under control. No prescriptions were called in, but all questions were addressed.  Pt verbalized understanding as well as all parties involved. No questions or concerns voiced at the time of discharge. No acute distress noted.   Pt ambulated out to PVA without incident or assistance.  Pt advised they will seek followup care with a specialist and followup with their PCP.

## 2023-10-23 DIAGNOSIS — M5432 Sciatica, left side: Secondary | ICD-10-CM | POA: Diagnosis not present

## 2023-10-23 DIAGNOSIS — M79662 Pain in left lower leg: Secondary | ICD-10-CM | POA: Diagnosis not present

## 2023-10-23 DIAGNOSIS — M79605 Pain in left leg: Secondary | ICD-10-CM | POA: Diagnosis not present

## 2023-10-24 DIAGNOSIS — M5432 Sciatica, left side: Secondary | ICD-10-CM | POA: Diagnosis not present

## 2023-10-24 DIAGNOSIS — M79605 Pain in left leg: Secondary | ICD-10-CM | POA: Diagnosis not present

## 2023-10-24 DIAGNOSIS — M79662 Pain in left lower leg: Secondary | ICD-10-CM | POA: Diagnosis not present

## 2023-10-27 DIAGNOSIS — M79605 Pain in left leg: Secondary | ICD-10-CM | POA: Diagnosis not present

## 2023-10-27 DIAGNOSIS — M79662 Pain in left lower leg: Secondary | ICD-10-CM | POA: Diagnosis not present

## 2023-10-27 DIAGNOSIS — M5432 Sciatica, left side: Secondary | ICD-10-CM | POA: Diagnosis not present

## 2023-10-31 DIAGNOSIS — M5432 Sciatica, left side: Secondary | ICD-10-CM | POA: Diagnosis not present

## 2023-10-31 DIAGNOSIS — M79662 Pain in left lower leg: Secondary | ICD-10-CM | POA: Diagnosis not present

## 2023-10-31 DIAGNOSIS — M79605 Pain in left leg: Secondary | ICD-10-CM | POA: Diagnosis not present

## 2023-11-03 ENCOUNTER — Ambulatory Visit (INDEPENDENT_AMBULATORY_CARE_PROVIDER_SITE_OTHER): Payer: Medicare Other | Admitting: Bariatrics

## 2023-11-03 ENCOUNTER — Encounter: Payer: Self-pay | Admitting: Bariatrics

## 2023-11-03 VITALS — BP 129/67 | HR 83 | Temp 97.8°F | Ht 65.0 in | Wt 226.0 lb

## 2023-11-03 DIAGNOSIS — R632 Polyphagia: Secondary | ICD-10-CM

## 2023-11-03 DIAGNOSIS — E669 Obesity, unspecified: Secondary | ICD-10-CM | POA: Diagnosis not present

## 2023-11-03 DIAGNOSIS — Z6837 Body mass index (BMI) 37.0-37.9, adult: Secondary | ICD-10-CM

## 2023-11-03 DIAGNOSIS — E88819 Insulin resistance, unspecified: Secondary | ICD-10-CM

## 2023-11-03 MED ORDER — METFORMIN HCL 500 MG PO TABS
500.0000 mg | ORAL_TABLET | Freq: Two times a day (BID) | ORAL | 0 refills | Status: DC
Start: 2023-11-03 — End: 2024-01-26

## 2023-11-03 MED ORDER — OZEMPIC (2 MG/DOSE) 8 MG/3ML ~~LOC~~ SOPN: PEN_INJECTOR | SUBCUTANEOUS | 0 refills | Status: DC

## 2023-11-03 NOTE — Progress Notes (Signed)
WEIGHT SUMMARY AND BIOMETRICS  Weight Lost Since Last Visit: 2lb  Weight Gained Since Last Visit: 0   Vitals Temp: 97.8 F (36.6 C) BP: 129/67 Pulse Rate: 83 SpO2: 96 %   Anthropometric Measurements Height: 5\' 5"  (1.651 m) Weight: 226 lb (102.5 kg) BMI (Calculated): 37.61 Weight at Last Visit: 228lb Weight Lost Since Last Visit: 2lb Weight Gained Since Last Visit: 0 Starting Weight: 287lb Total Weight Loss (lbs): 61 lb (27.7 kg)   Body Composition  Body Fat %: 50.5 % Fat Mass (lbs): 114.4 lbs Muscle Mass (lbs): 106.6 lbs Total Body Water (lbs): 84.4 lbs Visceral Fat Rating : 17   Other Clinical Data Fasting: no Labs: no Today's Visit #: 34 Starting Date: 12/22/19    OBESITY Allyson is here to discuss her progress with her obesity treatment plan along with follow-up of her obesity related diagnoses.    Nutrition Plan: keeping a food journal with goal of 1300 calories and 80 grams of protein daily - 75-80% adherence.  Current exercise:  Indoor bike.  Interim History:  She is down 2 lbs since her last visit.  Eating all of the food on the plan., Protein intake is as prescribed, Is not skipping meals, Journaling consistently., and Water intake is adequate.   Pharmacotherapy: Jeanelly is on Ozempic 2 mg SQ weekly and Metformin 500 mg twice daily with meals Adverse side effects: None Hunger is moderately controlled.  Cravings are moderately controlled.  Assessment/Plan:   1. Insulin Resistance Farryn has had elevated fasting insulin readings. Goal is HgbA1c < 5.7, fasting insulin at l0 or less, and preferably at 5.  She  denies polyphagia. Medication(s):Ozempic and Metformin.   Lab Results  Component Value Date   INSULIN 5.3 04/23/2023   INSULIN 10.8 07/11/2022   INSULIN 11.6 01/22/2022   INSULIN 7.3 07/11/2021   INSULIN 10.2 11/02/2020     Plan Medication(s): Ozempic 2 mg SQ weekly Will work on the agreed upon plan. Will minimize refined carbohydrates ( sweets and starches), and focus more on complex carbohydrates.  Increase the micronutrients found in leafy greens, which include magnesium, polyphenols, and vitamin C which have been postulated to help with insulin sensitivity. Minimize "fast food" and cook more meals at home.  Increase fiber to 25 to 30 grams daily.  Information sheet on " Insulin Resistance and Prediabetes".     2. Polyphagia Polyphagia Gayleen endorses excessive hunger.  Medication(s): Ozempic 2mg   Effects of medication:  moderately controlled. Cravings are moderately controlled.   Plan: Medication(s): Ozempic 2 mg SQ weekly and Metformin 500 mg twice daily with meals Will increase water, protein and fiber to help assuage hunger.  Will minimize foods that have a high glucose index/load to minimize reactive hypoglycemia.     Generalized Obesity: Current BMI BMI (Calculated): 37.61   Pharmacotherapy Plan Continue and refill  Ozempic 2 mg SQ  weekly and Metformin 500 mg twice daily with meals  Jenea is currently in the action stage of change. As such, her goal is to continue with weight loss efforts.  She has agreed to keeping a food journal with goal of 1,300 calories and 80 grams of protein daily.  Exercise goals: Older adults should determine their level of effort for physical activity relative to their level of fitness.   Behavioral modification strategies: increasing lean protein intake, decreasing simple carbohydrates , no meal skipping, meal planning , increase water intake, better snacking choices, planning for success, increasing fiber rich foods, get rid of junk food in the home, ways to avoid boredom eating, avoiding temptations, increase frequency of journaling, work on smaller portions, and mindful eating.  Daley has agreed to follow-up with our clinic in 6 weeks.     Objective:    VITALS: Per patient if applicable, see vitals. GENERAL: Alert and in no acute distress. CARDIOPULMONARY: No increased WOB. Speaking in clear sentences.  PSYCH: Pleasant and cooperative. Speech normal rate and rhythm. Affect is appropriate. Insight and judgement are appropriate. Attention is focused, linear, and appropriate.  NEURO: Oriented as arrived to appointment on time with no prompting.   Attestation Statements:    This was prepared with the assistance of Engineer, civil (consulting).  Occasional wrong-word or sound-a-like substitutions may have occurred due to the inherent limitations of voice recognition   Corinna Capra, DO

## 2023-11-05 DIAGNOSIS — M79605 Pain in left leg: Secondary | ICD-10-CM | POA: Diagnosis not present

## 2023-11-05 DIAGNOSIS — M5432 Sciatica, left side: Secondary | ICD-10-CM | POA: Diagnosis not present

## 2023-11-05 DIAGNOSIS — M79662 Pain in left lower leg: Secondary | ICD-10-CM | POA: Diagnosis not present

## 2023-11-07 DIAGNOSIS — M79605 Pain in left leg: Secondary | ICD-10-CM | POA: Diagnosis not present

## 2023-11-07 DIAGNOSIS — M79662 Pain in left lower leg: Secondary | ICD-10-CM | POA: Diagnosis not present

## 2023-11-07 DIAGNOSIS — M5432 Sciatica, left side: Secondary | ICD-10-CM | POA: Diagnosis not present

## 2023-11-12 DIAGNOSIS — M5432 Sciatica, left side: Secondary | ICD-10-CM | POA: Diagnosis not present

## 2023-11-12 DIAGNOSIS — M79662 Pain in left lower leg: Secondary | ICD-10-CM | POA: Diagnosis not present

## 2023-11-12 DIAGNOSIS — M79605 Pain in left leg: Secondary | ICD-10-CM | POA: Diagnosis not present

## 2023-11-17 DIAGNOSIS — M79662 Pain in left lower leg: Secondary | ICD-10-CM | POA: Diagnosis not present

## 2023-11-17 DIAGNOSIS — M79605 Pain in left leg: Secondary | ICD-10-CM | POA: Diagnosis not present

## 2023-11-17 DIAGNOSIS — M5432 Sciatica, left side: Secondary | ICD-10-CM | POA: Diagnosis not present

## 2023-11-18 ENCOUNTER — Ambulatory Visit (INDEPENDENT_AMBULATORY_CARE_PROVIDER_SITE_OTHER): Payer: Medicare Other | Admitting: Sports Medicine

## 2023-11-18 ENCOUNTER — Ambulatory Visit: Payer: Medicare Other

## 2023-11-18 DIAGNOSIS — M25561 Pain in right knee: Secondary | ICD-10-CM | POA: Diagnosis not present

## 2023-11-18 DIAGNOSIS — M170 Bilateral primary osteoarthritis of knee: Secondary | ICD-10-CM

## 2023-11-18 DIAGNOSIS — M545 Low back pain, unspecified: Secondary | ICD-10-CM | POA: Diagnosis not present

## 2023-11-18 DIAGNOSIS — M5416 Radiculopathy, lumbar region: Secondary | ICD-10-CM | POA: Diagnosis not present

## 2023-11-18 DIAGNOSIS — M25562 Pain in left knee: Secondary | ICD-10-CM | POA: Diagnosis not present

## 2023-11-18 DIAGNOSIS — M79659 Pain in unspecified thigh: Secondary | ICD-10-CM | POA: Diagnosis not present

## 2023-11-18 DIAGNOSIS — M5116 Intervertebral disc disorders with radiculopathy, lumbar region: Secondary | ICD-10-CM | POA: Diagnosis not present

## 2023-11-18 DIAGNOSIS — G8929 Other chronic pain: Secondary | ICD-10-CM | POA: Diagnosis not present

## 2023-11-18 MED ORDER — PREDNISONE 10 MG (48) PO TBPK
ORAL_TABLET | Freq: Every day | ORAL | 0 refills | Status: DC
Start: 2023-11-18 — End: 2024-02-23

## 2023-11-18 NOTE — Assessment & Plan Note (Signed)
Pleasant 72 year old female, chronic low back pain, increasing pain left buttock, posterior thigh, lower leg down to the foot, she has paresthesias 1st through 4th toes. Worse with standing. She was initially seen in urgent care, she had some pain behind the calf so she was shipped off to the emergency department where an ultrasound showed a potential Baker's cyst but no evidence of DVT. She was referred to physical therapy for her knee and Baker's cyst but is not improving. On exam she has no tenderness at the semitendinosis/gastrocnemius crux, no obvious palpable masses. I think that though she may have had an incidental Baker's cyst noted this is not the pain generator, her symptoms are much more highly consistent with lumbar radiculopathy, I explained to her the difference between radiculopathy and sciatica, we went over the anatomy and evolutionary anthropology of the lumbar spine, she will do some physical therapy at breakthrough, x-rays, adding additional 12-day prednisone taper. Return to see me in 6 weeks, MR for interventional planning if not better.

## 2023-11-18 NOTE — Progress Notes (Signed)
    Procedures performed today:    None.  Independent interpretation of notes and tests performed by another provider:   None.  Brief History, Exam, Impression, and Recommendations:    Left lumbar radiculopathy Pleasant 72 year old female, chronic low back pain, increasing pain left buttock, posterior thigh, lower leg down to the foot, she has paresthesias 1st through 4th toes. Worse with standing. She was initially seen in urgent care, she had some pain behind the calf so she was shipped off to the emergency department where an ultrasound showed a potential Baker's cyst but no evidence of DVT. She was referred to physical therapy for her knee and Baker's cyst but is not improving. On exam she has no tenderness at the semitendinosis/gastrocnemius crux, no obvious palpable masses. I think that though she may have had an incidental Baker's cyst noted this is not the pain generator, her symptoms are much more highly consistent with lumbar radiculopathy, I explained to her the difference between radiculopathy and sciatica, we went over the anatomy and evolutionary anthropology of the lumbar spine, she will do some physical therapy at breakthrough, x-rays, adding additional 12-day prednisone taper. Return to see me in 6 weeks, MR for interventional planning if not better.    ____________________________________________ Ihor Austin. Benjamin Stain, M.D., ABFM., CAQSM., AME. Primary Care and Sports Medicine [facility]  Adjunct Professor of Family Medicine  [employer]  Restaurant manager, fast food

## 2023-11-21 DIAGNOSIS — H353131 Nonexudative age-related macular degeneration, bilateral, early dry stage: Secondary | ICD-10-CM | POA: Diagnosis not present

## 2023-11-21 DIAGNOSIS — H5213 Myopia, bilateral: Secondary | ICD-10-CM | POA: Diagnosis not present

## 2023-11-21 DIAGNOSIS — H26493 Other secondary cataract, bilateral: Secondary | ICD-10-CM | POA: Diagnosis not present

## 2023-11-26 DIAGNOSIS — Z860101 Personal history of adenomatous and serrated colon polyps: Secondary | ICD-10-CM | POA: Diagnosis not present

## 2023-11-26 DIAGNOSIS — Z09 Encounter for follow-up examination after completed treatment for conditions other than malignant neoplasm: Secondary | ICD-10-CM | POA: Diagnosis not present

## 2023-11-26 DIAGNOSIS — D122 Benign neoplasm of ascending colon: Secondary | ICD-10-CM | POA: Diagnosis not present

## 2023-11-28 DIAGNOSIS — D122 Benign neoplasm of ascending colon: Secondary | ICD-10-CM | POA: Diagnosis not present

## 2023-12-15 ENCOUNTER — Ambulatory Visit (INDEPENDENT_AMBULATORY_CARE_PROVIDER_SITE_OTHER): Payer: Medicare Other | Admitting: Bariatrics

## 2023-12-15 ENCOUNTER — Encounter: Payer: Self-pay | Admitting: Bariatrics

## 2023-12-15 VITALS — BP 119/75 | HR 83 | Temp 97.7°F | Ht 65.0 in | Wt 230.0 lb

## 2023-12-15 DIAGNOSIS — Z6838 Body mass index (BMI) 38.0-38.9, adult: Secondary | ICD-10-CM

## 2023-12-15 DIAGNOSIS — E669 Obesity, unspecified: Secondary | ICD-10-CM | POA: Diagnosis not present

## 2023-12-15 DIAGNOSIS — E66812 Obesity, class 2: Secondary | ICD-10-CM

## 2023-12-15 DIAGNOSIS — R632 Polyphagia: Secondary | ICD-10-CM | POA: Diagnosis not present

## 2023-12-15 NOTE — Progress Notes (Signed)
WEIGHT SUMMARY AND BIOMETRICS  Weight Lost Since Last Visit: 0  Weight Gained Since Last Visit: 4lb   Vitals Temp: 97.7 F (36.5 C) BP: (!) 152/79 Pulse Rate: 83 SpO2: 97 %   Anthropometric Measurements Height: 5\' 5"  (1.651 m) Weight: 230 lb (104.3 kg) BMI (Calculated): 38.27 Weight at Last Visit: 226lb Weight Lost Since Last Visit: 0 Weight Gained Since Last Visit: 4lb Starting Weight: 287lb Total Weight Loss (lbs): 57 lb (25.9 kg)   Body Composition  Body Fat %: 51.1 % Fat Mass (lbs): 117.6 lbs Muscle Mass (lbs): 106.8 lbs Total Body Water (lbs): 84 lbs Visceral Fat Rating : 17   Other Clinical Data Fasting: no Labs: no Today's Visit #: 44 Starting Date: 12/22/19    OBESITY Jackie Montoya is here to discuss her progress with her obesity treatment plan along with follow-up of her obesity related diagnoses.    Nutrition Plan: keeping a food journal with goal of 1300 calories and 80 grams of protein daily - 50% adherence.  Current exercise:  Exercise paper.  Interim History:  She is up 4 lbs since her last visit. She had been on a prednisone taper which stimulated her appetite.  Eating all of the food on the plan., Protein intake is as prescribed, Is not skipping meals, Not journaling consistently., and Denies polyphagia   Pharmacotherapy: Jackie Montoya is on Ozempic 2 mg SQ weekly Adverse side effects: None Hunger is moderately controlled.  Cravings are moderately controlled.  Assessment/Plan:   Jackie Montoya endorses excessive hunger which is controlled with Ozempic.  Medication(s): Ozempic Effects of medication:  moderately controlled. Cravings are moderately controlled.   Plan: Medication(s): Ozempic 2 mg SQ weekly Will increase water, protein and fiber to help assuage hunger.  Will minimize foods that have a high glucose index/load to minimize  reactive hypoglycemia.     Generalized Obesity: Current BMI BMI (Calculated): 38.27   Pharmacotherapy Plan Continue  Ozempic 2 mg SQ weekly  Jackie Montoya is currently in the action stage of change. As such, her goal is to continue with weight loss efforts.  She has agreed to keeping a food journal with goal of 1,300  calories and 80 grams of protein daily.  Exercise goals: Older adults should follow the adult guidelines. When older adults cannot meet the adult guidelines, they should be as physically active as their abilities and conditions will allow.   Behavioral modification strategies: increasing lean protein intake, decreasing simple carbohydrates , no meal skipping, decrease eating out, meal planning , increase water intake, better snacking choices, planning for success, increasing vegetables, and decrease snacking .  Jackie Montoya has agreed to follow-up with our clinic in 6 weeks.       Objective:   VITALS: Per patient if applicable, see vitals. GENERAL: Alert and in no acute distress. CARDIOPULMONARY: No increased WOB. Speaking in clear sentences.  PSYCH: Pleasant and  cooperative. Speech normal rate and rhythm. Affect is appropriate. Insight and judgement are appropriate. Attention is focused, linear, and appropriate.  NEURO: Oriented as arrived to appointment on time with no prompting.   Attestation Statements:     This was prepared with the assistance of Engineer, civil (consulting).  Occasional wrong-word or sound-a-like substitutions may have occurred due to the inherent limitations of voice recognition   Jackie Capra, DO

## 2023-12-18 DIAGNOSIS — M6281 Muscle weakness (generalized): Secondary | ICD-10-CM | POA: Diagnosis not present

## 2023-12-18 DIAGNOSIS — M5416 Radiculopathy, lumbar region: Secondary | ICD-10-CM | POA: Diagnosis not present

## 2023-12-18 DIAGNOSIS — M79605 Pain in left leg: Secondary | ICD-10-CM | POA: Diagnosis not present

## 2023-12-18 DIAGNOSIS — M25562 Pain in left knee: Secondary | ICD-10-CM | POA: Diagnosis not present

## 2023-12-24 DIAGNOSIS — H26492 Other secondary cataract, left eye: Secondary | ICD-10-CM | POA: Diagnosis not present

## 2023-12-25 DIAGNOSIS — M25562 Pain in left knee: Secondary | ICD-10-CM | POA: Diagnosis not present

## 2023-12-25 DIAGNOSIS — M5416 Radiculopathy, lumbar region: Secondary | ICD-10-CM | POA: Diagnosis not present

## 2023-12-25 DIAGNOSIS — M6281 Muscle weakness (generalized): Secondary | ICD-10-CM | POA: Diagnosis not present

## 2023-12-25 DIAGNOSIS — M79605 Pain in left leg: Secondary | ICD-10-CM | POA: Diagnosis not present

## 2023-12-30 ENCOUNTER — Ambulatory Visit (INDEPENDENT_AMBULATORY_CARE_PROVIDER_SITE_OTHER): Payer: Medicare Other | Admitting: Sports Medicine

## 2023-12-30 DIAGNOSIS — M5416 Radiculopathy, lumbar region: Secondary | ICD-10-CM | POA: Diagnosis not present

## 2023-12-30 DIAGNOSIS — M25562 Pain in left knee: Secondary | ICD-10-CM | POA: Diagnosis not present

## 2023-12-30 DIAGNOSIS — M79605 Pain in left leg: Secondary | ICD-10-CM | POA: Diagnosis not present

## 2023-12-30 DIAGNOSIS — M6281 Muscle weakness (generalized): Secondary | ICD-10-CM | POA: Diagnosis not present

## 2023-12-30 NOTE — Assessment & Plan Note (Signed)
 Please see prior notes, this is a pleasant 73 year old female with increasing pain left buttock, posterior thigh lower leg to the foot, 1st through 4th toes paresthesias. We ultimately treated her for lumbar radiculopathy with a 12-day prednisone  taper and formal PT, she is doing really well with PT, pain is improving dramatically, she has only done 2 weeks so far, she will do another 4 weeks of physical therapy and return to see me, if insufficient relief we will proceed with MRI for interventional planning.

## 2023-12-30 NOTE — Progress Notes (Signed)
    Procedures performed today:    None.  Independent interpretation of notes and tests performed by another provider:   None.  Brief History, Exam, Impression, and Recommendations:    Left lumbar radiculopathy Please see prior notes, this is a pleasant 73 year old female with increasing pain left buttock, posterior thigh lower leg to the foot, 1st through 4th toes paresthesias. We ultimately treated her for lumbar radiculopathy with a 12-day prednisone  taper and formal PT, she is doing really well with PT, pain is improving dramatically, she has only done 2 weeks so far, she will do another 4 weeks of physical therapy and return to see me, if insufficient relief we will proceed with MRI for interventional planning.    ____________________________________________ Debby PARAS. Curtis, M.D., ABFM., CAQSM., AME. Primary Care and Sports Medicine [facility]  Adjunct Professor of [facility] Medicine  [employer]  Restaurant Manager, [employer]

## 2023-12-31 DIAGNOSIS — H26491 Other secondary cataract, right eye: Secondary | ICD-10-CM | POA: Diagnosis not present

## 2024-01-02 DIAGNOSIS — M25562 Pain in left knee: Secondary | ICD-10-CM | POA: Diagnosis not present

## 2024-01-02 DIAGNOSIS — M5416 Radiculopathy, lumbar region: Secondary | ICD-10-CM | POA: Diagnosis not present

## 2024-01-02 DIAGNOSIS — M79605 Pain in left leg: Secondary | ICD-10-CM | POA: Diagnosis not present

## 2024-01-02 DIAGNOSIS — M6281 Muscle weakness (generalized): Secondary | ICD-10-CM | POA: Diagnosis not present

## 2024-01-06 DIAGNOSIS — M25562 Pain in left knee: Secondary | ICD-10-CM | POA: Diagnosis not present

## 2024-01-06 DIAGNOSIS — M5416 Radiculopathy, lumbar region: Secondary | ICD-10-CM | POA: Diagnosis not present

## 2024-01-06 DIAGNOSIS — M79605 Pain in left leg: Secondary | ICD-10-CM | POA: Diagnosis not present

## 2024-01-06 DIAGNOSIS — M6281 Muscle weakness (generalized): Secondary | ICD-10-CM | POA: Diagnosis not present

## 2024-01-13 DIAGNOSIS — M25562 Pain in left knee: Secondary | ICD-10-CM | POA: Diagnosis not present

## 2024-01-13 DIAGNOSIS — M6281 Muscle weakness (generalized): Secondary | ICD-10-CM | POA: Diagnosis not present

## 2024-01-13 DIAGNOSIS — M79605 Pain in left leg: Secondary | ICD-10-CM | POA: Diagnosis not present

## 2024-01-13 DIAGNOSIS — M5416 Radiculopathy, lumbar region: Secondary | ICD-10-CM | POA: Diagnosis not present

## 2024-01-15 DIAGNOSIS — M79605 Pain in left leg: Secondary | ICD-10-CM | POA: Diagnosis not present

## 2024-01-15 DIAGNOSIS — M5416 Radiculopathy, lumbar region: Secondary | ICD-10-CM | POA: Diagnosis not present

## 2024-01-15 DIAGNOSIS — M6281 Muscle weakness (generalized): Secondary | ICD-10-CM | POA: Diagnosis not present

## 2024-01-15 DIAGNOSIS — M25562 Pain in left knee: Secondary | ICD-10-CM | POA: Diagnosis not present

## 2024-01-18 IMAGING — US US BREAST*L* LIMITED INC AXILLA
1 series · 5 of 5 positions shown · non-contrast
Comparison: Previous exam(s).

CLINICAL DATA: 71-year-old female for further evaluation of 2
possible LEFT breast asymmetries identified on screening mammogram.

EXAM:
DIGITAL DIAGNOSTIC UNILATERAL LEFT MAMMOGRAM WITH TOMOSYNTHESIS AND
CAD; ULTRASOUND LEFT BREAST LIMITED
TECHNIQUE: Left digital diagnostic mammography and breast tomosynthesis was
performed. The images were evaluated with computer-aided detection.;
Targeted ultrasound examination of the left breast was performed.

[Series 1: us breast*left* limited inc axilla · 0.05mm/px · 5 of 5 slices shown]
[im 1/5]
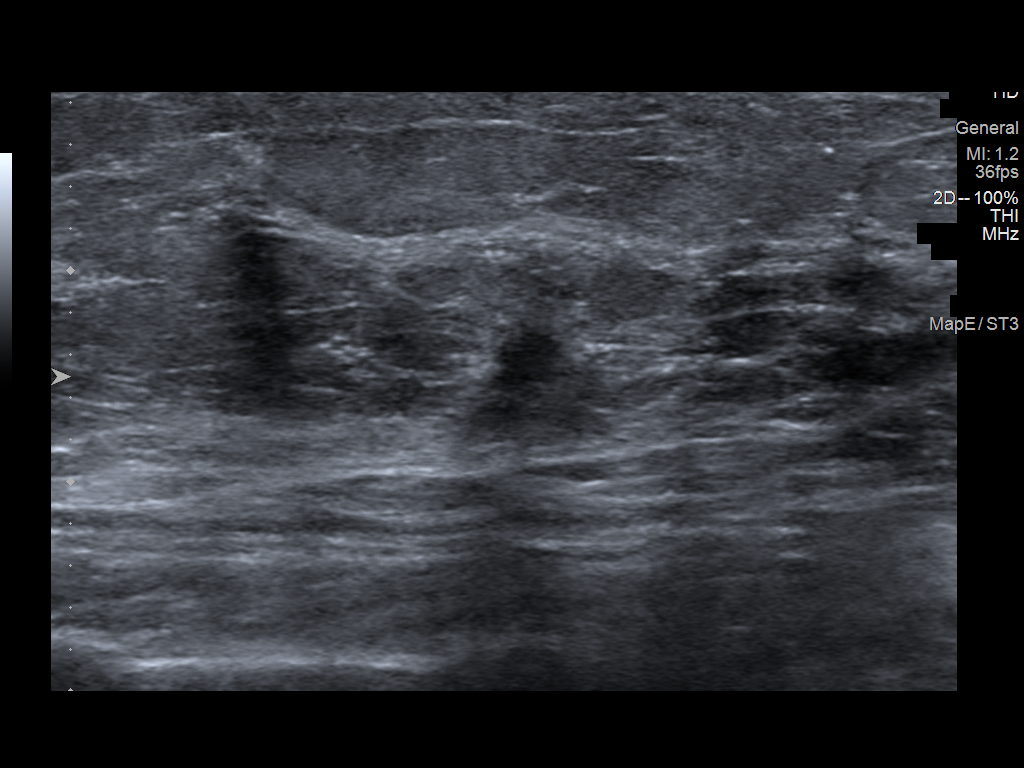
[im 2/5]
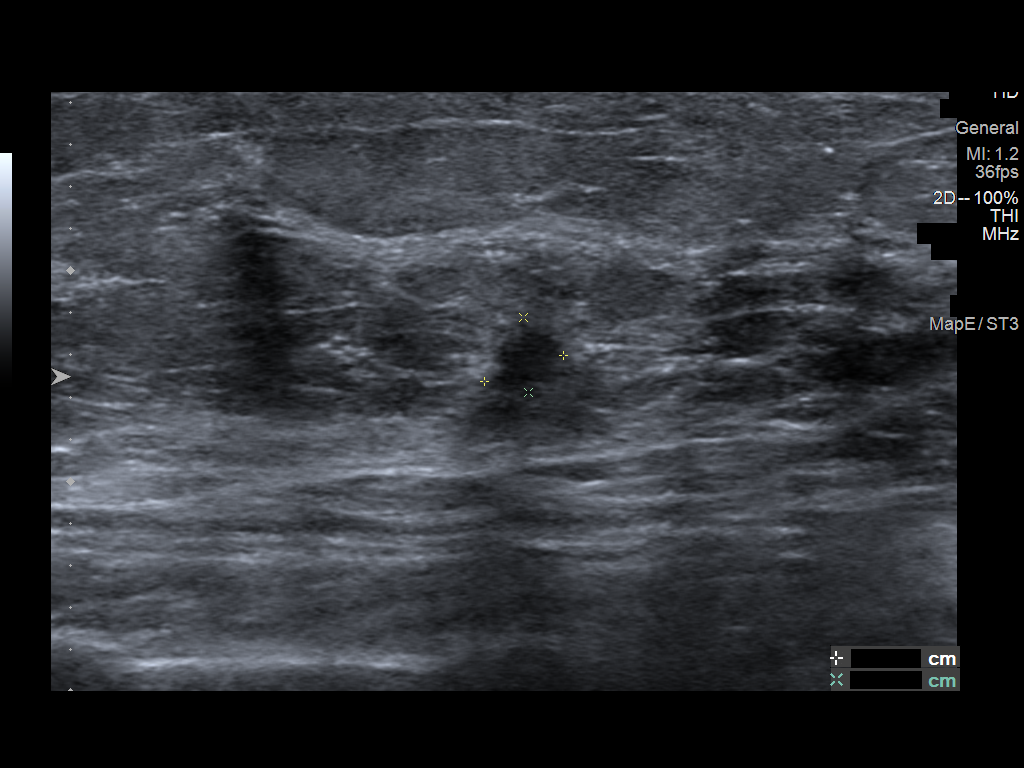
[im 3/5]
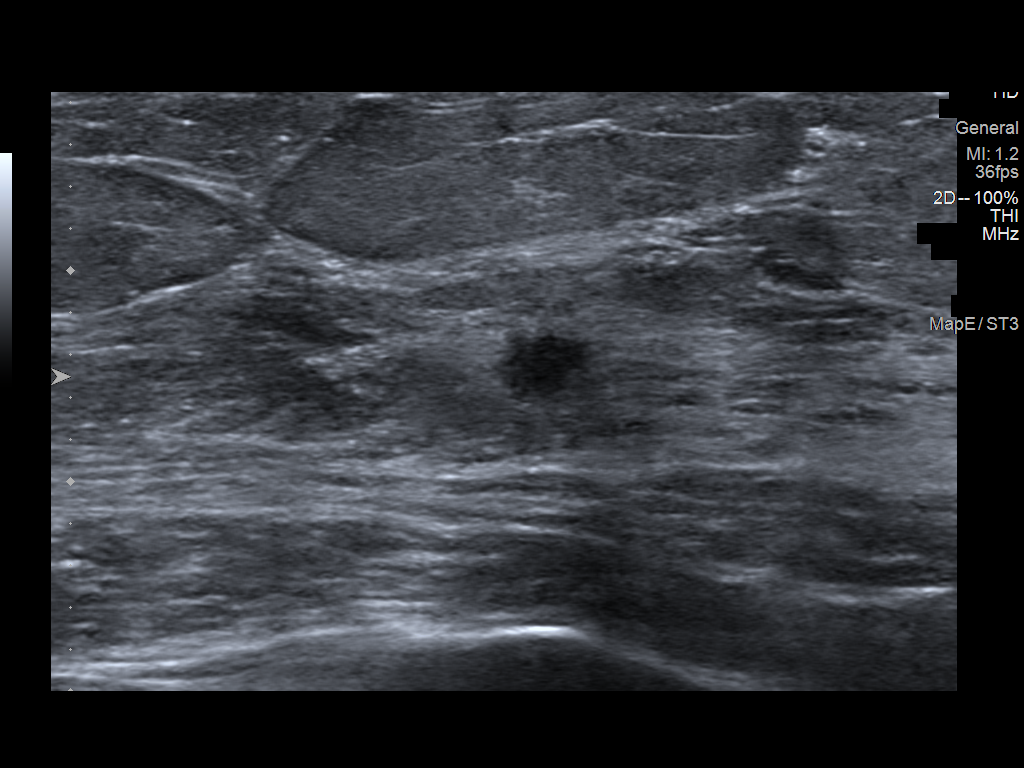
[im 4/5]
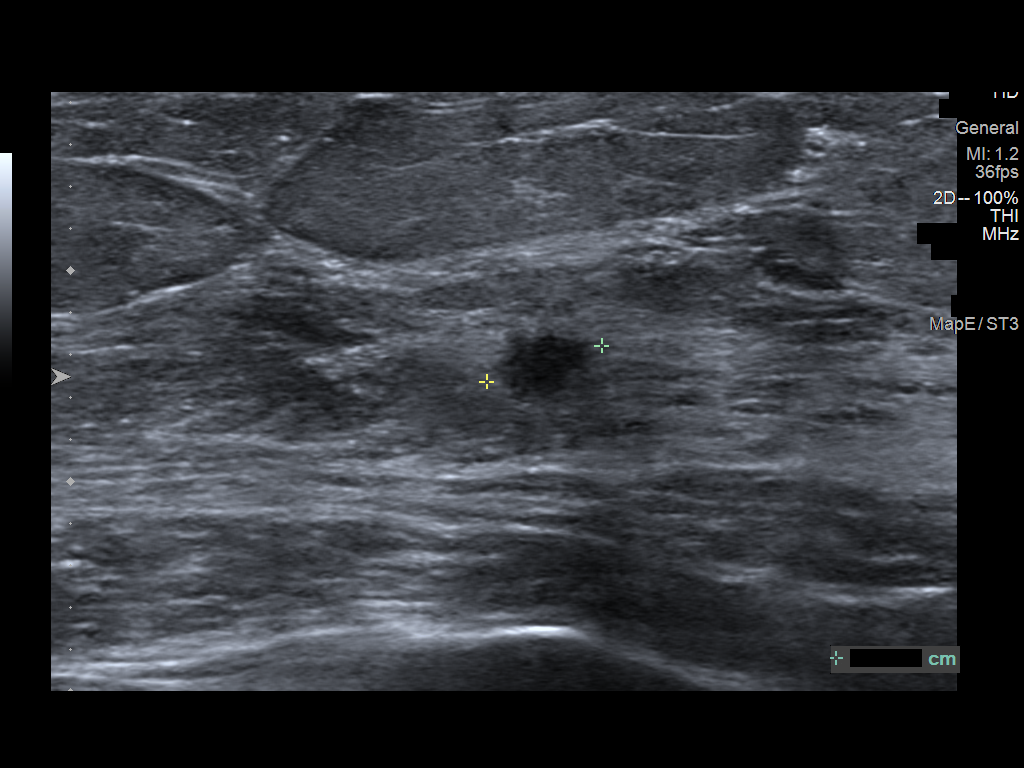
[im 5/5]
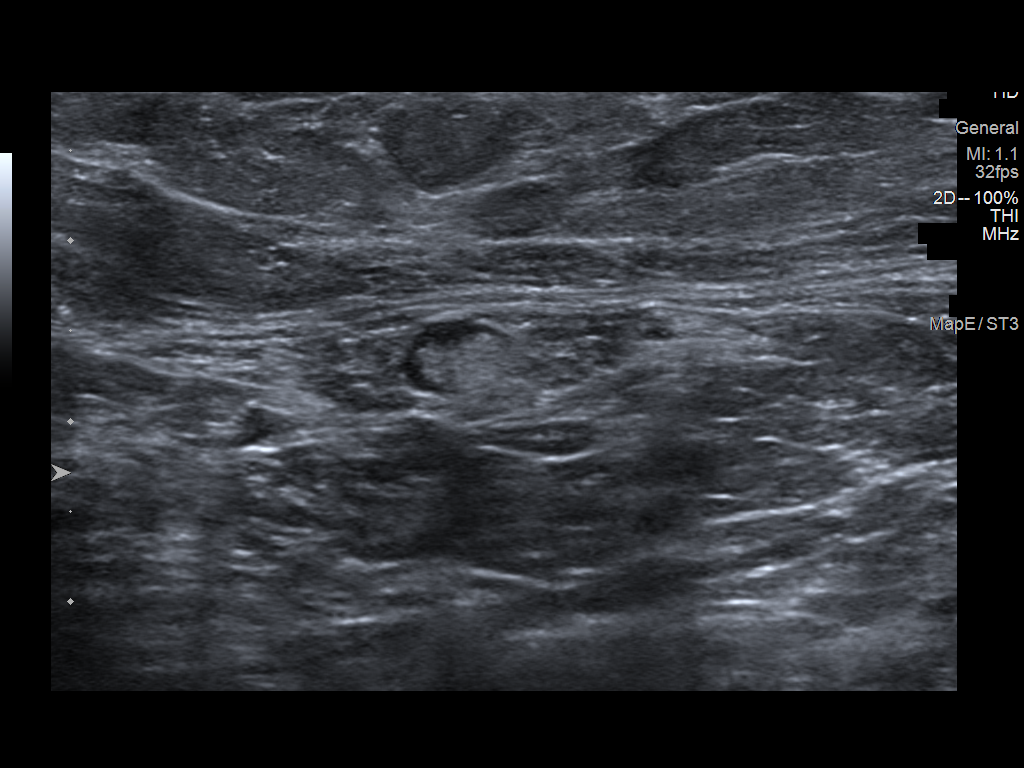

[5 of 5 positions shown; findings below may reference images not displayed]

ACR Breast Density Category b: There are scattered areas of
fibroglandular density.
FINDINGS: Full field and spot compression views of the LEFT breast demonstrate
a persistent 0.6 cm irregular mass within the UPPER LEFT breast,
middle depth. No other persistent suspicious abnormalities are
noted.

Targeted ultrasound is performed, showing a 0.4 x 0.4 x 0.6 cm
irregular hypoechoic mass at the 11 o'clock position of the LEFT
breast 5 cm from the nipple, a good correlate to the mammographic
finding.

No abnormal LEFT axillary lymph nodes are noted.
IMPRESSION: 1. Suspicious 0.6 cm UPPER INNER LEFT breast mass. Tissue sampling
is recommended.
2. No abnormal appearing LEFT axillary lymph nodes.

RECOMMENDATION:
Ultrasound-guided LEFT breast biopsy, which will be scheduled.

I have discussed the findings and recommendations with the patient.
If applicable, a reminder letter will be sent to the patient
regarding the next appointment.

BI-RADS CATEGORY  4: Suspicious.

## 2024-01-18 IMAGING — MG MM DIGITAL DIAGNOSTIC UNILAT*L* W/ TOMO W/ CAD
6 series · 6 of 18 positions shown · non-contrast
Comparison: Previous exam(s).

CLINICAL DATA: 71-year-old female for further evaluation of 2
possible LEFT breast asymmetries identified on screening mammogram.

EXAM:
DIGITAL DIAGNOSTIC UNILATERAL LEFT MAMMOGRAM WITH TOMOSYNTHESIS AND
CAD; ULTRASOUND LEFT BREAST LIMITED
TECHNIQUE: Left digital diagnostic mammography and breast tomosynthesis was
performed. The images were evaluated with computer-aided detection.;
Targeted ultrasound examination of the left breast was performed.

[L ML synth-2D]
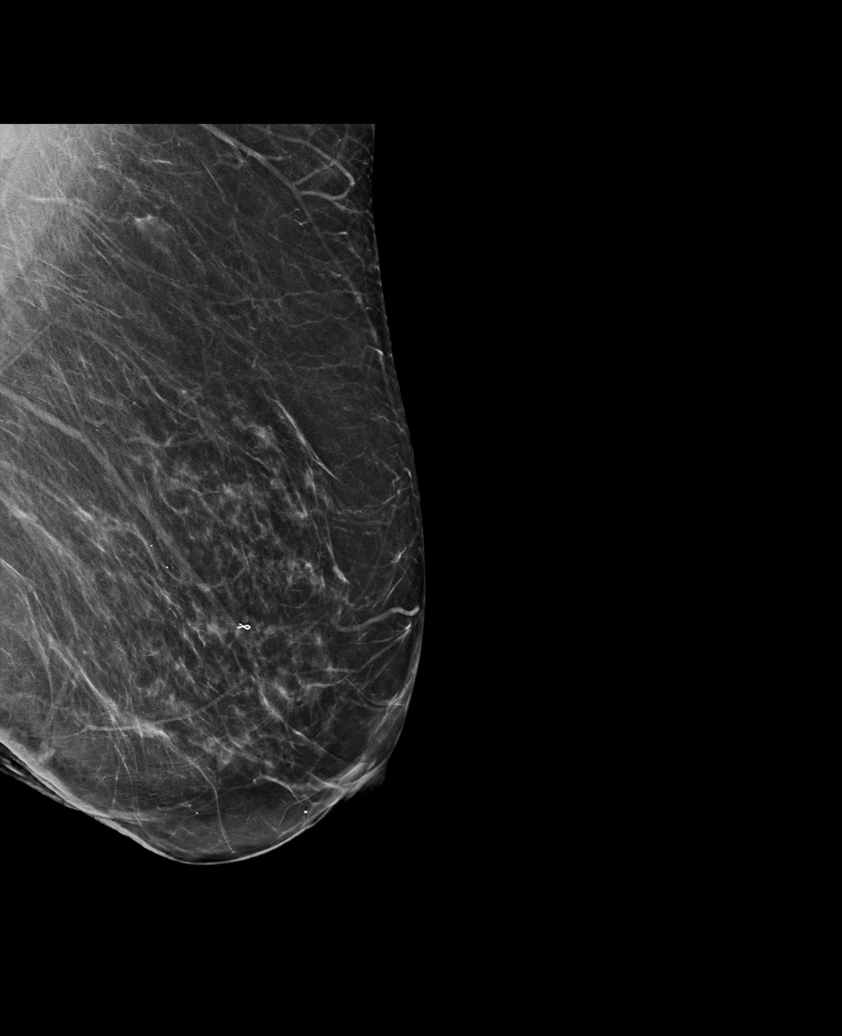

[L CC synth-2D (1 of 2)]
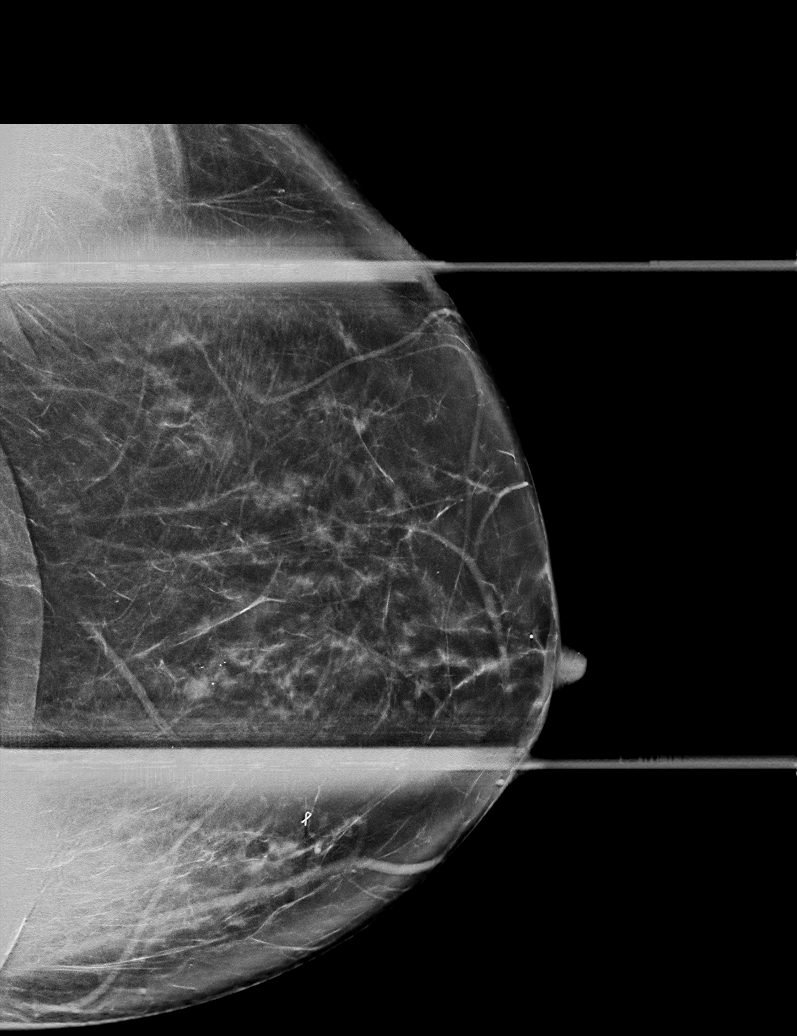

[L CC synth-2D (2 of 2)]
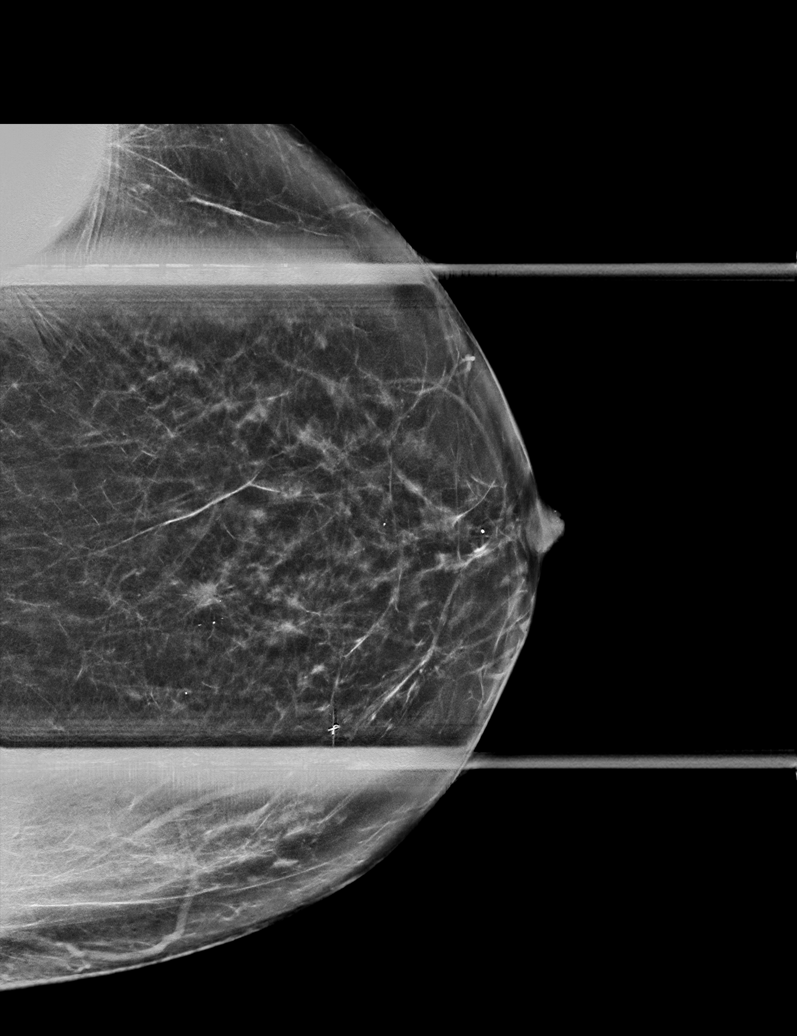

[L ML tomo · tomo slice 35/70.0]
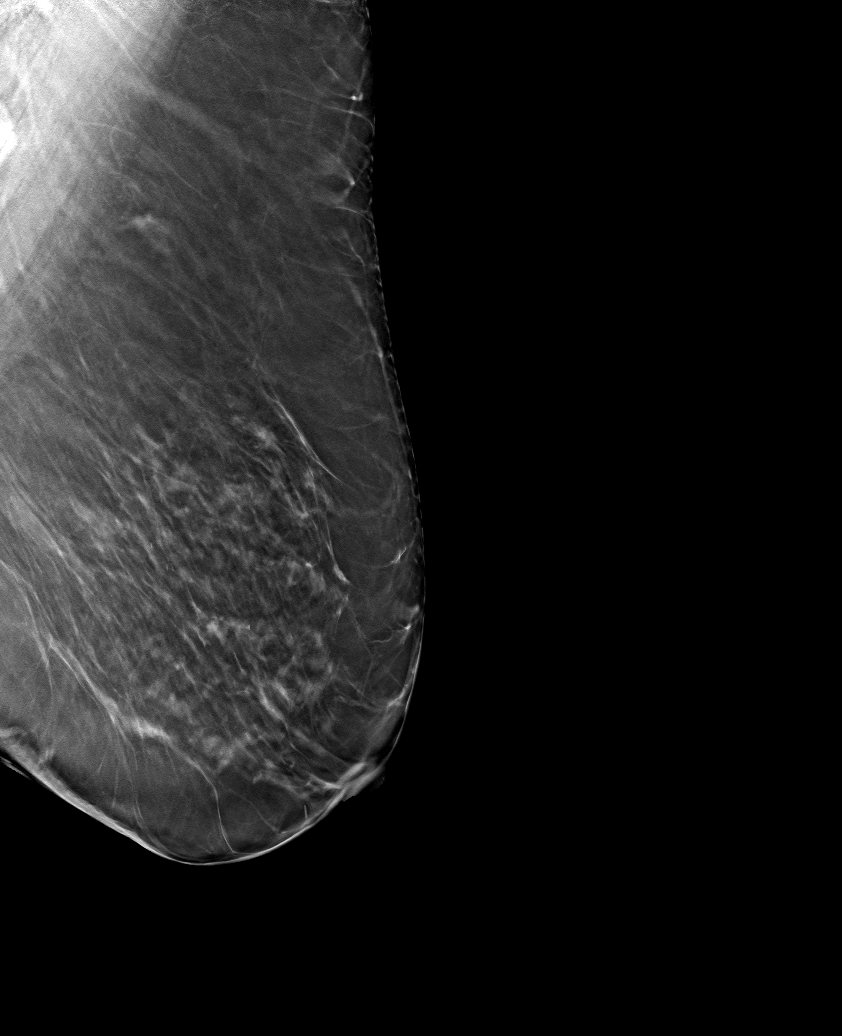

[L CC tomo (1 of 2) · tomo slice 33/64.0]
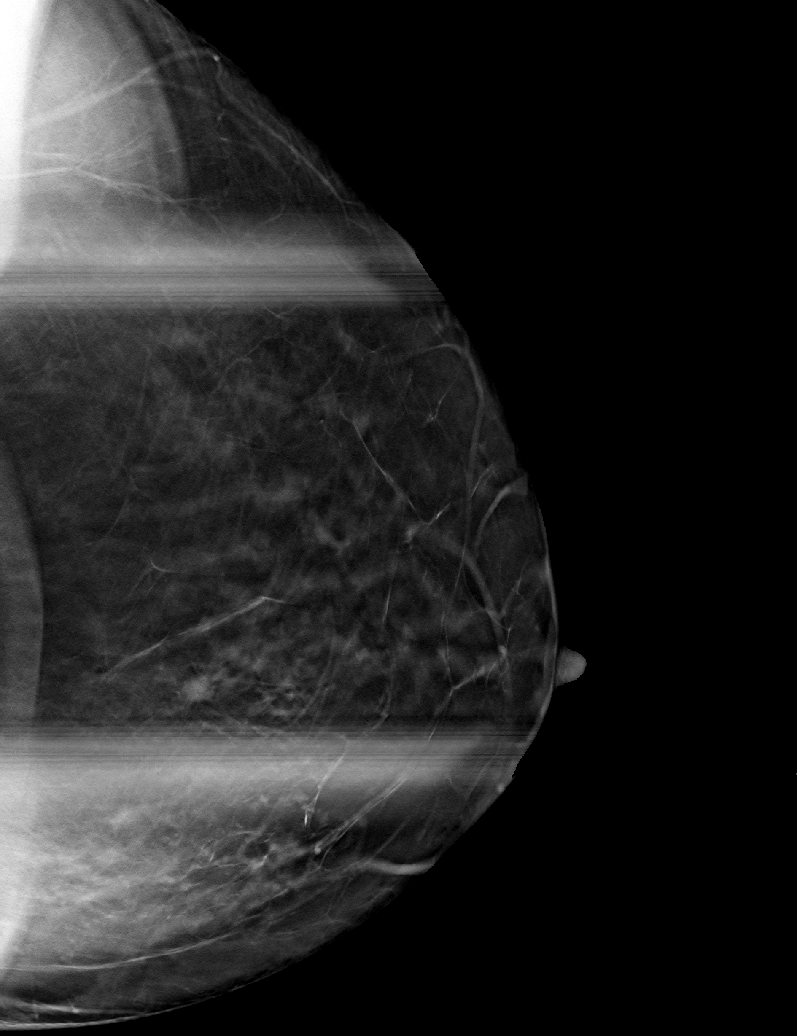

[L CC tomo (2 of 2) · tomo slice 31/60.0]
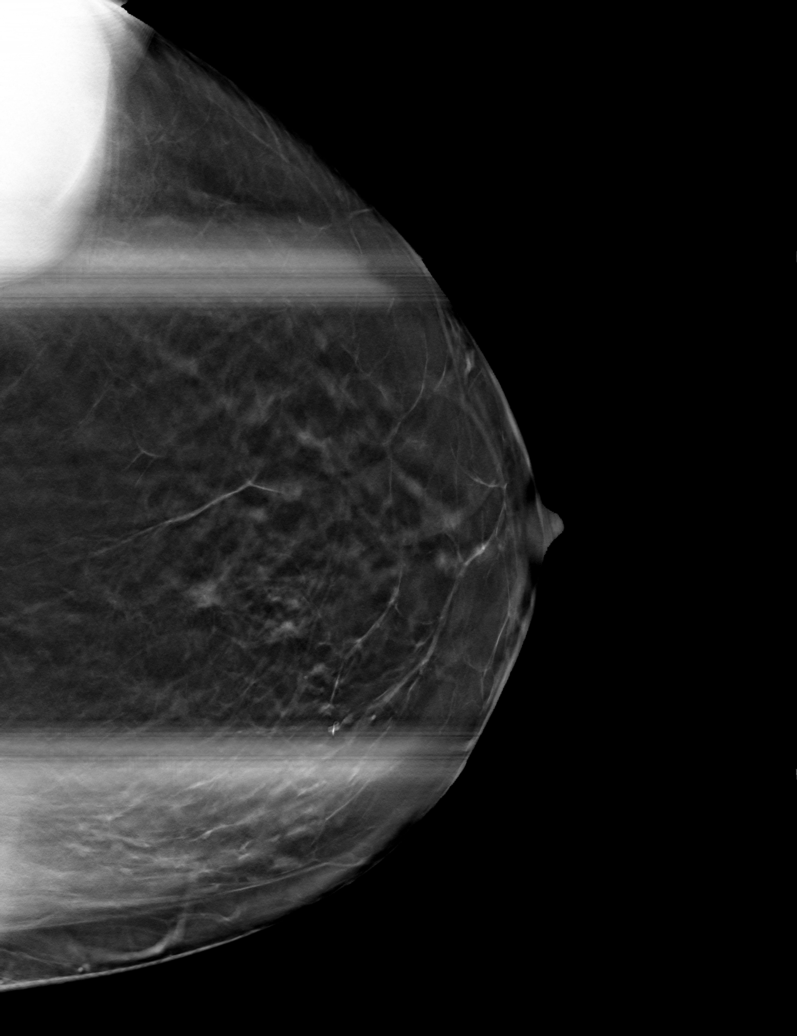

[6 of 18 positions shown; findings below may reference images not displayed]

ACR Breast Density Category b: There are scattered areas of
fibroglandular density.
FINDINGS: Full field and spot compression views of the LEFT breast demonstrate
a persistent 0.6 cm irregular mass within the UPPER LEFT breast,
middle depth. No other persistent suspicious abnormalities are
noted.

Targeted ultrasound is performed, showing a 0.4 x 0.4 x 0.6 cm
irregular hypoechoic mass at the 11 o'clock position of the LEFT
breast 5 cm from the nipple, a good correlate to the mammographic
finding.

No abnormal LEFT axillary lymph nodes are noted.
IMPRESSION: 1. Suspicious 0.6 cm UPPER INNER LEFT breast mass. Tissue sampling
is recommended.
2. No abnormal appearing LEFT axillary lymph nodes.

RECOMMENDATION:
Ultrasound-guided LEFT breast biopsy, which will be scheduled.

I have discussed the findings and recommendations with the patient.
If applicable, a reminder letter will be sent to the patient
regarding the next appointment.

BI-RADS CATEGORY  4: Suspicious.

## 2024-01-20 DIAGNOSIS — M25562 Pain in left knee: Secondary | ICD-10-CM | POA: Diagnosis not present

## 2024-01-20 DIAGNOSIS — M5416 Radiculopathy, lumbar region: Secondary | ICD-10-CM | POA: Diagnosis not present

## 2024-01-20 DIAGNOSIS — M6281 Muscle weakness (generalized): Secondary | ICD-10-CM | POA: Diagnosis not present

## 2024-01-20 DIAGNOSIS — M79605 Pain in left leg: Secondary | ICD-10-CM | POA: Diagnosis not present

## 2024-01-23 DIAGNOSIS — M6281 Muscle weakness (generalized): Secondary | ICD-10-CM | POA: Diagnosis not present

## 2024-01-23 DIAGNOSIS — M5416 Radiculopathy, lumbar region: Secondary | ICD-10-CM | POA: Diagnosis not present

## 2024-01-23 DIAGNOSIS — M79605 Pain in left leg: Secondary | ICD-10-CM | POA: Diagnosis not present

## 2024-01-23 DIAGNOSIS — M25562 Pain in left knee: Secondary | ICD-10-CM | POA: Diagnosis not present

## 2024-01-26 ENCOUNTER — Ambulatory Visit (INDEPENDENT_AMBULATORY_CARE_PROVIDER_SITE_OTHER): Payer: Medicare Other | Admitting: Bariatrics

## 2024-01-26 ENCOUNTER — Encounter: Payer: Self-pay | Admitting: Bariatrics

## 2024-01-26 VITALS — BP 119/76 | HR 85 | Temp 99.0°F | Ht 65.0 in | Wt 228.0 lb

## 2024-01-26 DIAGNOSIS — E88819 Insulin resistance, unspecified: Secondary | ICD-10-CM | POA: Diagnosis not present

## 2024-01-26 DIAGNOSIS — E669 Obesity, unspecified: Secondary | ICD-10-CM

## 2024-01-26 DIAGNOSIS — R632 Polyphagia: Secondary | ICD-10-CM

## 2024-01-26 DIAGNOSIS — Z6837 Body mass index (BMI) 37.0-37.9, adult: Secondary | ICD-10-CM

## 2024-01-26 MED ORDER — OZEMPIC (2 MG/DOSE) 8 MG/3ML ~~LOC~~ SOPN: PEN_INJECTOR | SUBCUTANEOUS | 0 refills | Status: DC

## 2024-01-26 MED ORDER — METFORMIN HCL 500 MG PO TABS
500.0000 mg | ORAL_TABLET | Freq: Two times a day (BID) | ORAL | 0 refills | Status: AC
Start: 2024-01-26 — End: ?

## 2024-01-26 NOTE — Progress Notes (Signed)
 WEIGHT SUMMARY AND BIOMETRICS  Weight Lost Since Last Visit: 2lb  Weight Gained Since Last Visit: 0   Vitals Temp: 99 F (37.2 C) BP: 119/76 Pulse Rate: 85 SpO2: 97 %   Anthropometric Measurements Height: 5\' 5"  (1.651 m) Weight: 228 lb (103.4 kg) BMI (Calculated): 37.94 Weight at Last Visit: 230lb Weight Lost Since Last Visit: 2lb Weight Gained Since Last Visit: 0 Starting Weight: 287lb Total Weight Loss (lbs): 59 lb (26.8 kg)   Body Composition  Body Fat %: 45.1 % Fat Mass (lbs): 103 lbs Muscle Mass (lbs): 119 lbs Total Body Water (lbs): 84 lbs Visceral Fat Rating : 15   Other Clinical Data Fasting: no Labs: no Today's Visit #: 31 Starting Date: 12/22/19    OBESITY Jackie Montoya is here to discuss her progress with her obesity treatment plan along with follow-up of her obesity related diagnoses.    Nutrition Plan: keeping a food journal with goal of 1300 calories and 80 grams of protein daily - 60% adherence.  Current exercise:  Stationary bike  Interim History:  She is down another 2 lbs since her last visit. She has been going to PT 2 x a week.  Eating all of the food on the plan., Protein intake is as prescribed, Is not skipping meals, and Water intake is adequate.   Pharmacotherapy: Jackie Montoya is on Ozempic  2 mg SQ weekly and Metformin  500 mg twice daily with meals Adverse side effects: None Hunger is moderately controlled.  Cravings are moderately controlled.  Assessment/Plan:   1. Polyphagia Polyphagia Jackie Montoya endorses excessive hunger.  Medication(s): Metformin  and Qzempic Effects of medication:  moderately controlled. Cravings are moderately controlled.   Plan: Medication(s): Ozempic  2 mg SQ weekly Will increase water, protein and fiber to help assuage hunger.  Will minimize foods that have a high glucose index/load to minimize reactive  hypoglycemia.    Insulin  Resistance Jackie Montoya has had elevated fasting insulin  readings. Goal is HgbA1c < 5.7, fasting insulin  at l0 or less, and preferably at 5.  She  denies polyphagia. Medication(s): Metformin  and Ozempic  Lab Results  Component Value Date   HGBA1C 5.2 04/23/2023   Lab Results  Component Value Date   INSULIN  5.3 04/23/2023   INSULIN  10.8 07/11/2022   INSULIN  11.6 01/22/2022   INSULIN  7.3 07/11/2021   INSULIN  10.2 11/02/2020    Plan Medication(s): Ozempic  2 mg SQ weekly, Metformin  Will work on the agreed upon plan. Will minimize refined carbohydrates ( sweets and starches), and focus more on complex carbohydrates.  Increase the micronutrients found in leafy greens, which include magnesium, polyphenols, and vitamin C which have been postulated to help with insulin  sensitivity. Minimize "fast food" and cook more meals at home.  Increase fiber to 25 to 30 grams daily.  Information sheet on " Insulin  Resistance and Prediabetes".      Generalized Obesity: Current BMI BMI (Calculated):  37.94   Pharmacotherapy Plan Continue and refill  Ozempic  2 mg SQ weekly and Metformin  500 mg twice daily with meals  Jackie Montoya is currently in the action stage of change. As such, her goal is to continue with weight loss efforts.  She has agreed to keeping a food journal with goal of 1,300 calories and 80 grams of protein daily.  Exercise goals: Older adults should follow the adult guidelines. When older adults cannot meet the adult guidelines, they should be as physically active as their abilities and conditions will allow.   Behavioral modification strategies: increasing lean protein intake, decreasing simple carbohydrates , no meal skipping, increase water intake, better snacking choices, keep healthy foods in the home, and put fork down between bites.  Jackie Montoya has agreed to follow-up with our clinic in 4 weeks.      Objective:   VITALS: Per patient if applicable, see  vitals. GENERAL: Alert and in no acute distress. CARDIOPULMONARY: No increased WOB. Speaking in clear sentences.  PSYCH: Pleasant and cooperative. Speech normal rate and rhythm. Affect is appropriate. Insight and judgement are appropriate. Attention is focused, linear, and appropriate.  NEURO: Oriented as arrived to appointment on time with no prompting.   Attestation Statements:    This was prepared with the assistance of Engineer, civil (consulting).  Occasional wrong-word or sound-a-like substitutions may have occurred due to the inherent limitations of voice recognition   Kirk Peper, DO

## 2024-01-27 ENCOUNTER — Ambulatory Visit (INDEPENDENT_AMBULATORY_CARE_PROVIDER_SITE_OTHER): Payer: Medicare Other | Admitting: Sports Medicine

## 2024-01-27 DIAGNOSIS — M79605 Pain in left leg: Secondary | ICD-10-CM | POA: Diagnosis not present

## 2024-01-27 DIAGNOSIS — M5416 Radiculopathy, lumbar region: Secondary | ICD-10-CM | POA: Diagnosis not present

## 2024-01-27 DIAGNOSIS — M6281 Muscle weakness (generalized): Secondary | ICD-10-CM | POA: Diagnosis not present

## 2024-01-27 DIAGNOSIS — M25562 Pain in left knee: Secondary | ICD-10-CM | POA: Diagnosis not present

## 2024-01-27 NOTE — Assessment & Plan Note (Addendum)
This is a very pleasant 73 year old female, she has known multifactorial low back pain with facet arthropathy and degenerative disc disease. From a symptomatic standpoint she had increasing left buttock pain, posterior thigh, lower leg to the foot, 1st through 4th toe paresthesias. She did really well initially with a 12-day prednisone taper, formal PT, she continues with PT and is almost completely pain-free now. She will continue with therapy at home 3 times weekly. She can return to see me on an as-needed basis. If pain recurs and is persistent and spite of medication and home physical therapy we will proceed with MRI for epidural/facet injection planning.

## 2024-01-27 NOTE — Progress Notes (Signed)
    Procedures performed today:    None.  Independent interpretation of notes and tests performed by another provider:   None.  Brief History, Exam, Impression, and Recommendations:    Left lumbar radiculopathy This is a very pleasant 73 year old female, she has known multifactorial low back pain with facet arthropathy and degenerative disc disease. From a symptomatic standpoint she had increasing left buttock pain, posterior thigh, lower leg to the foot, 1st through 4th toe paresthesias. She did really well initially with a 12-day prednisone taper, formal PT, she continues with PT and is almost completely pain-free now. She will continue with therapy at home 3 times weekly. She can return to see me on an as-needed basis. If pain recurs and is persistent and spite of medication and home physical therapy we will proceed with MRI for epidural/facet injection planning.    ____________________________________________ Ihor Austin. Benjamin Stain, M.D., ABFM., CAQSM., AME. Primary Care and Sports Medicine [facility]  Adjunct Professor of Family Medicine  [employer]  Restaurant manager, fast food

## 2024-01-30 DIAGNOSIS — M6281 Muscle weakness (generalized): Secondary | ICD-10-CM | POA: Diagnosis not present

## 2024-01-30 DIAGNOSIS — M25562 Pain in left knee: Secondary | ICD-10-CM | POA: Diagnosis not present

## 2024-01-30 DIAGNOSIS — M79605 Pain in left leg: Secondary | ICD-10-CM | POA: Diagnosis not present

## 2024-01-30 DIAGNOSIS — M5416 Radiculopathy, lumbar region: Secondary | ICD-10-CM | POA: Diagnosis not present

## 2024-02-03 DIAGNOSIS — M25562 Pain in left knee: Secondary | ICD-10-CM | POA: Diagnosis not present

## 2024-02-03 DIAGNOSIS — M6281 Muscle weakness (generalized): Secondary | ICD-10-CM | POA: Diagnosis not present

## 2024-02-03 DIAGNOSIS — M5416 Radiculopathy, lumbar region: Secondary | ICD-10-CM | POA: Diagnosis not present

## 2024-02-03 DIAGNOSIS — M79605 Pain in left leg: Secondary | ICD-10-CM | POA: Diagnosis not present

## 2024-02-23 ENCOUNTER — Ambulatory Visit: Payer: Medicare Other | Admitting: Hematology and Oncology

## 2024-02-23 ENCOUNTER — Inpatient Hospital Stay: Payer: Medicare Other | Attending: Hematology and Oncology | Admitting: Hematology and Oncology

## 2024-02-23 VITALS — BP 139/87 | HR 86 | Temp 98.0°F | Resp 18 | Ht 65.0 in | Wt 235.3 lb

## 2024-02-23 DIAGNOSIS — Z170 Estrogen receptor positive status [ER+]: Secondary | ICD-10-CM | POA: Insufficient documentation

## 2024-02-23 DIAGNOSIS — R634 Abnormal weight loss: Secondary | ICD-10-CM | POA: Diagnosis not present

## 2024-02-23 DIAGNOSIS — Z79811 Long term (current) use of aromatase inhibitors: Secondary | ICD-10-CM | POA: Diagnosis not present

## 2024-02-23 DIAGNOSIS — M1990 Unspecified osteoarthritis, unspecified site: Secondary | ICD-10-CM | POA: Diagnosis not present

## 2024-02-23 DIAGNOSIS — Z79899 Other long term (current) drug therapy: Secondary | ICD-10-CM | POA: Insufficient documentation

## 2024-02-23 DIAGNOSIS — C50212 Malignant neoplasm of upper-inner quadrant of left female breast: Secondary | ICD-10-CM | POA: Diagnosis not present

## 2024-02-23 DIAGNOSIS — Z7984 Long term (current) use of oral hypoglycemic drugs: Secondary | ICD-10-CM | POA: Insufficient documentation

## 2024-02-23 DIAGNOSIS — Z923 Personal history of irradiation: Secondary | ICD-10-CM | POA: Insufficient documentation

## 2024-02-23 NOTE — Assessment & Plan Note (Signed)
 05/28/2022 screening mammogram detected left breast asymmetry at 11 o'clock position measuring 0.6 cm.  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 XRT: Completed 08/22/2022 3.  Adjuvant antiestrogen therapy with letrozole started 12/24/2022 switching to anastrozole 05/26/2023 due to muscle aches and pains discontinued 07/24/2023 (leg pains)   Anastrozole toxicities:  Lower leg still aching and hip and lower back (even walking causes pain) Discontinued anastrozole 07/24/2023 Joint pains have improved significantly but not fully gone away. We discussed the role of tamoxifen as another option.  Patient would like to stay off antiestrogen therapy for a little while longer before making a decision.   Breast cancer surveillance: Mammogram 05/09/2023: Benign breast density category B Bone density 05/09/2023: T-score -1.6: (Used to be -0.4 in 2022) mild osteopenia   Return to clinic in 6 months for a follow-up to make a final decision regarding tamoxifen therapy.

## 2024-02-23 NOTE — Progress Notes (Signed)
 Patient Care Team: Ollen Bowl, MD as PCP - General (Internal Medicine) Manus Rudd, MD as Consulting Physician (General Surgery) Serena Croissant, MD as Consulting Physician (Hematology and Oncology) Lonie Peak, MD as Attending Physician (Radiation Oncology) Carlisle Cater, MD as Consulting Physician (Obstetrics and Gynecology) [place], Amy, MD as Consulting Physician (Dermatology) Monica Becton, MD as Consulting Physician (Sports Medicine)  DIAGNOSIS:  Encounter Diagnosis  Name Primary?   Malignant neoplasm of upper-inner quadrant of left breast in female, estrogen receptor positive (HCC) Yes    SUMMARY OF ONCOLOGIC HISTORY: Oncology History  Malignant neoplasm of upper-inner quadrant of left breast in female, estrogen receptor positive (HCC)  06/03/2022 Initial Diagnosis   Malignant neoplasm of upper-inner quadrant of left breast in female, estrogen receptor positive (HCC)   06/05/2022 Cancer Staging   Staging form: Breast, AJCC 8th Edition - Clinical: Stage IA (cT1b, cN0, cM0, G1, ER+, PR+, HER2-) - Signed by Serena Croissant, MD on 06/05/2022 Stage prefix: Initial diagnosis Histologic grading system: 3 grade system   06/12/2022 Genetic Testing   Negative hereditary cancer genetic testing: no pathogenic variants detected in [facility]CAP[facility] Panel.  Report date is June 12, 2022.   The BRCAplus panel offered by [employer] and includes sequencing and deletion/duplication analysis for the following 8 genes: ATM, BRCA1, BRCA2, CDH1, CHEK2, PALB2, PTEN, and TP53.  Pan-cancer panel is pending.    06/27/2022 Surgery    Left lumpectomy 06/27/2022: Grade 1 IDC 0.6 cm with intermediate grade DCIS, margins negative    07/31/2022 - 08/22/2022 Radiation Therapy   Site Technique Total Dose (Gy) Dose per Fx (Gy) Completed Fx Beam Energies  Breast, Left: Breast_L_axilla 3D 40.05/40.05 2.67 15/15 15X, 10XFFF     09/2022 -  Anti-estrogen oral therapy   2.5 mg Letrozole x 5  years     CHIEF COMPLIANT: Follow-up to discuss antiestrogen therapy  HISTORY OF PRESENT ILLNESS:  History of Present Illness The patient, with a history of estrogen receptor positive, HER2 negative breast cancer and arthritis, presents for a follow-up visit. She reports feeling good overall and has noticed significant improvement in her arthritis symptoms with daily exercises. She also reports a significant weight loss of almost 90 pounds over the past four years, which she attributes to regular visits to a weight management clinic.  The patient had previously been on hormone therapy for her breast cancer but stopped due to side effects including increased pain, mood swings, and overall lower energy levels. She reports that her family noticed a significant difference in her demeanor and energy levels when she was on the medication. The patient is currently not on any hormone therapy and is considering not resuming it due to the side effects and her current good health status.     ALLERGIES:  is allergic to codeine.  MEDICATIONS:  Current Outpatient Medications  Medication Sig Dispense Refill   atorvastatin (LIPITOR) 20 MG tablet Take 20 mg by mouth daily.     buPROPion (WELLBUTRIN XL) 300 MG 24 hr tablet Take 300 mg by mouth daily.     CALCIUM PO Take by mouth daily.      Cholecalciferol (VITAMIN D) 125 MCG (5000 UT) CAPS Take 1 capsule by mouth daily. 30 capsule 0   FLUoxetine (PROZAC) 40 MG capsule Take 40 mg by mouth daily.     fluticasone (FLONASE) 50 MCG/ACT nasal spray Place into both nostrils daily.     hydrochlorothiazide (HYDRODIURIL) 25 MG tablet Take 25 mg by mouth daily.  metFORMIN (GLUCOPHAGE) 500 MG tablet Take 1 tablet (500 mg total) by mouth 2 (two) times daily with a meal. 180 tablet 0   omeprazole (PRILOSEC) 20 MG capsule Take 20 mg by mouth daily.     OZEMPIC, 2 MG/DOSE, 8 MG/3ML SOPN Into the skin weekly 9 mL 0   Probiotic Product (PROBIOTIC DAILY PO) Take 2  capsules by mouth daily. Emma Probiotic     No current facility-administered medications for this visit.    PHYSICAL EXAMINATION: ECOG PERFORMANCE STATUS: 1 - Symptomatic but completely ambulatory  Vitals:   02/23/24 1200  BP: 139/87  Pulse: 86  Resp: 18  Temp: 98 F (36.7 C)  SpO2: 96%   Filed Weights   02/23/24 1200  Weight: 235 lb 4.8 oz (106.7 kg)    LABORATORY DATA:  I have reviewed the data as listed    Latest Ref Rng & Units 04/23/2023   12:20 PM 06/05/2022   12:14 PM 01/22/2022   10:54 AM  CMP  Glucose 70 - 99 mg/dL 92  95  90   BUN 8 - 27 mg/dL 18  19  14    Creatinine 0.57 - 1.00 mg/dL 1.61  0.96  0.45   Sodium 134 - 144 mmol/L 140  137  139   Potassium 3.5 - 5.2 mmol/L 4.6  3.9  3.9   Chloride 96 - 106 mmol/L 99  102  97   CO2 20 - 29 mmol/L 25  29  24    Calcium 8.7 - 10.3 mg/dL 9.7  9.6  9.7   Total Protein 6.0 - 8.5 g/dL 6.7  7.1  6.7   Total Bilirubin 0.0 - 1.2 mg/dL 0.5  0.6  0.8   Alkaline Phos 44 - 121 IU/L 111  80  104   AST 0 - 40 IU/L 22  17  20    ALT 0 - 32 IU/L 20  15  13      Lab Results  Component Value Date   WBC 7.5 06/05/2022   HGB 14.1 06/05/2022   HCT 41.4 06/05/2022   MCV 88.5 06/05/2022   PLT 248 06/05/2022   NEUTROABS 4.7 06/05/2022    ASSESSMENT & PLAN:  Malignant neoplasm of upper-inner quadrant of left breast in female, estrogen receptor positive (HCC) 05/28/2022 screening mammogram detected left breast asymmetry at 11 o'clock position measuring 0.6 cm.  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 XRT: Completed 08/22/2022 3.  Adjuvant antiestrogen therapy with letrozole started 12/24/2022 switching to anastrozole 05/26/2023 due to muscle aches and pains discontinued 07/24/2023 (leg pains)   Anastrozole toxicities:  Lower leg  still aching and hip and lower back (even walking causes pain) Discontinued anastrozole 07/24/2023 Joint pains have improved significantly but not fully gone away.  After discussing the pros and cons of antiestrogen therapy we decided not to pursue it any further.  This is to improve her quality of life and prevent her from getting additional symptoms of joint stiffness and pains.   Breast cancer surveillance: Mammogram 05/09/2023: Benign breast density category B Bone density 05/09/2023: T-score -1.6: (Used to be -0.4 in 2022) mild osteopenia   Return to clinic in 1 year for surveillance and follow-up ------------------------------------- Assessment and Plan Assessment & Plan Estrogen Receptor Positive, HER2 Negative Breast Cancer Patient has completed radiation therapy  and is currently not on any hormone therapy due to side effects experienced previously. Discussed the risk of recurrence with and without hormone therapy. Patient prefers to avoid hormone therapy due to its impact on her quality of life. -Continue to monitor closely with annual follow-ups for the next five years.  Arthritis and Disc Degeneration Patient reports moderate pain and has been doing exercises as part of physical therapy which has helped manage the pain. -Encourage continuation of exercises and physical therapy to manage pain.  Obesity Patient has made significant progress with weight loss over the past four years and is continuing efforts to lose more weight. -Encourage continuation of weight loss efforts for overall health improvement.      No orders of the defined types were placed in this encounter.  The patient has a good understanding of the overall plan. she agrees with it. she will call with any problems that may develop before the next visit here. Total time spent: 30 mins including face to face time and time spent for planning, charting and co-ordination of care   Tamsen Meek,  MD 02/23/24

## 2024-03-01 ENCOUNTER — Encounter: Payer: Self-pay | Admitting: Bariatrics

## 2024-03-01 ENCOUNTER — Ambulatory Visit (INDEPENDENT_AMBULATORY_CARE_PROVIDER_SITE_OTHER): Payer: Medicare Other | Admitting: Bariatrics

## 2024-03-01 VITALS — BP 148/75 | HR 81 | Temp 98.6°F | Ht 65.0 in | Wt 226.0 lb

## 2024-03-01 DIAGNOSIS — R632 Polyphagia: Secondary | ICD-10-CM

## 2024-03-01 DIAGNOSIS — E669 Obesity, unspecified: Secondary | ICD-10-CM | POA: Diagnosis not present

## 2024-03-01 DIAGNOSIS — E782 Mixed hyperlipidemia: Secondary | ICD-10-CM | POA: Diagnosis not present

## 2024-03-01 DIAGNOSIS — Z6837 Body mass index (BMI) 37.0-37.9, adult: Secondary | ICD-10-CM | POA: Diagnosis not present

## 2024-03-01 DIAGNOSIS — R7309 Other abnormal glucose: Secondary | ICD-10-CM

## 2024-03-01 DIAGNOSIS — E785 Hyperlipidemia, unspecified: Secondary | ICD-10-CM

## 2024-03-01 DIAGNOSIS — E559 Vitamin D deficiency, unspecified: Secondary | ICD-10-CM

## 2024-03-01 DIAGNOSIS — E66812 Obesity, class 2: Secondary | ICD-10-CM | POA: Diagnosis not present

## 2024-03-01 DIAGNOSIS — E7849 Other hyperlipidemia: Secondary | ICD-10-CM | POA: Diagnosis not present

## 2024-03-01 NOTE — Progress Notes (Signed)
 WEIGHT SUMMARY AND BIOMETRICS  Weight Lost Since Last Visit: 2lb  Weight Gained Since Last Visit: 0lb   Vitals Temp: 98.6 F (37 C) BP: (!) 148/75 Pulse Rate: 81 SpO2: 96 %   Anthropometric Measurements Height: 5\' 5"  (1.651 m) Weight: 226 lb (102.5 kg) BMI (Calculated): 37.61 Weight at Last Visit: 228lb Weight Lost Since Last Visit: 2lb Weight Gained Since Last Visit: 0lb Starting Weight: 287lb Total Weight Loss (lbs): 61 lb (27.7 kg)   Body Composition  Body Fat %: 49.6 % Fat Mass (lbs): 112.2 lbs Muscle Mass (lbs): 108.2 lbs Total Body Water (lbs): 80.6 lbs Visceral Fat Rating : 16   Other Clinical Data Fasting: Yes Labs: Yes Today's Visit #: 39 Starting Date: 12/22/19    OBESITY Jackie Montoya is here to discuss her progress with her obesity treatment plan along with follow-up of her obesity related diagnoses.    Nutrition Plan: keeping a food journal with goal of 1300 calories and 80 grams of protein daily - 75% adherence.  Current exercise: bicycling  Interim History:  She is down an additional 2 lb since her last visit. She has a new Print production planner.  Eating all of the food on the plan., Protein intake is as prescribed, Protein intake is less than prescribed., Is not skipping meals, and Water intake is adequate.   Pharmacotherapy: [name] is on Ozempic 2 mg SQ weekly Adverse side effects: None Hunger is moderately controlled.  Cravings are moderately controlled.  Assessment/Plan:   Camera Krienke endorses excessive hunger.  Medication(s): Ozempic 2 mg Effects of medication:  moderately controlled. Cravings are moderately controlled.   Plan: Medication(s): Ozempic 2 mg SQ weekly Will increase water, protein and fiber to help assuage hunger.  Will minimize foods that have a high glucose index/load to minimize reactive hypoglycemia. She  will increase her exercise. She will start to walk.    Hyperlipidemia LDL is at goal. Medication(s): Lipitor Cardiovascular risk factors: dyslipidemia, obesity (BMI >= 30 kg/m2), and sedentary lifestyle  Lab Results  Component Value Date   CHOL 189 04/23/2023   HDL 85 04/23/2023   LDLCALC 91 04/23/2023   TRIG 71 04/23/2023   Lab Results  Component Value Date   ALT 20 04/23/2023   AST 22 04/23/2023   ALKPHOS 111 04/23/2023   BILITOT 0.5 04/23/2023   The 10-year ASCVD risk score (Arnett DK, et al., 2019) is: 19.1%   Values used to calculate the score:     Age: 33 years     Sex: Female     Is Non-Hispanic African American: No     Diabetic: No     Tobacco smoker: No     Systolic Blood Pressure: 148 mmHg     Is BP treated: Yes     HDL Cholesterol: 85 mg/dL     Total Cholesterol: 189 mg/dL  Plan:  Continue statin.  Information sheet on healthy vs unhealthy fats.  Will avoid all trans fats.  Will read labels Will minimize saturated fats except the following: low fat meats in moderation, diary, and limited dark chocolate.  Increase Omega 3 in foods, and consider an Omega 3 supplement.    Vitamin D deficiency:   Taking vitamin D prescription/OTC  Plan: Continue vitamin D. Will check vitamin D.    Elevated glucose:   She needs labs today. Her HgbA1c was improved at her last lab draw. She is taking medications as directed.   Plan: Will check HgbA1c and insulin. Will continue to work on the plan and exercise as able.   Labs done today (CMP, Lipids, HgbA1c, insulin, vitamin D).    Generalized Obesity: Current BMI BMI (Calculated): 37.61   Pharmacotherapy Plan Continue and refill  Ozempic 2 mg SQ weekly  Kealie is currently in the action stage of change. As such, her goal is to continue with weight loss efforts.  She has agreed to keeping a food journal with goal of 1,300 calories and 80 grams of protein daily.  Exercise goals: Older adults should follow the adult  guidelines. When older adults cannot meet the adult guidelines, they should be as physically active as their abilities and conditions will allow.  She is doing exercises for her lower back, and using her bike. She will start to walk.   Behavioral modification strategies: increasing lean protein intake, decreasing simple carbohydrates , no meal skipping, meal planning , increase water intake, better snacking choices, avoiding temptations, keep healthy foods in the home, and mindful eating.  [name] has agreed to follow-up with our clinic in 4 weeks.      Objective:   VITALS: Per patient if applicable, see vitals. GENERAL: Alert and in no acute distress. CARDIOPULMONARY: No increased WOB. Speaking in clear sentences.  PSYCH: Pleasant and cooperative. Speech normal rate and rhythm. Affect is appropriate. Insight and judgement are appropriate. Attention is focused, linear, and appropriate.  NEURO: Oriented as arrived to appointment on time with no prompting.   Attestation Statements:    This was prepared with the assistance of Engineer, civil (consulting).  Occasional wrong-word or sound-a-like substitutions may have occurred due to the inherent limitations of voice recognition   Corinna Capra, DO

## 2024-03-03 LAB — LIPID PANEL WITH LDL/HDL RATIO
Cholesterol, Total: 179 mg/dL (ref 100–199)
HDL: 85 mg/dL (ref >39)
LDL Chol Calc (NIH): 81 mg/dL (ref 0–99)
LDL/HDL Ratio: 1 ratio (ref 0.0–3.2)
Triglycerides: 68 mg/dL (ref 0–149)
VLDL Cholesterol Cal: 13 mg/dL (ref 5–40)

## 2024-03-03 LAB — COMPREHENSIVE METABOLIC PANEL
ALT: 12 IU/L (ref 0–32)
AST: 17 IU/L (ref 0–40)
Albumin: 4.6 g/dL (ref 3.8–4.8)
Alkaline Phosphatase: 104 IU/L (ref 44–121)
BUN/Creatinine Ratio: 19 (ref 12–28)
BUN: 13 mg/dL (ref 8–27)
Bilirubin Total: 0.5 mg/dL (ref 0.0–1.2)
CO2: 23 mmol/L (ref 20–29)
Calcium: 9.6 mg/dL (ref 8.7–10.3)
Chloride: 99 mmol/L (ref 96–106)
Creatinine, Ser: 0.69 mg/dL (ref 0.57–1.00)
Globulin, Total: 1.8 g/dL (ref 1.5–4.5)
Glucose: 88 mg/dL (ref 70–99)
Potassium: 4.6 mmol/L (ref 3.5–5.2)
Sodium: 138 mmol/L (ref 134–144)
Total Protein: 6.4 g/dL (ref 6.0–8.5)
eGFR: 92 mL/min/{1.73_m2} (ref >59)

## 2024-03-03 LAB — VITAMIN D 25 HYDROXY (VIT D DEFICIENCY, FRACTURES): Vit D, 25-Hydroxy: 64 ng/mL (ref 30.0–100.0)

## 2024-03-03 LAB — INSULIN, RANDOM: INSULIN: 9.4 u[IU]/mL (ref 2.6–24.9)

## 2024-03-03 LAB — HEMOGLOBIN A1C
Est. average glucose Bld gHb Est-mCnc: 100 mg/dL
Hgb A1c MFr Bld: 5.1 % (ref 4.8–5.6)

## 2024-03-29 ENCOUNTER — Encounter: Payer: Self-pay | Admitting: Bariatrics

## 2024-03-29 ENCOUNTER — Ambulatory Visit (INDEPENDENT_AMBULATORY_CARE_PROVIDER_SITE_OTHER): Admitting: Bariatrics

## 2024-03-29 VITALS — BP 144/76 | HR 79 | Temp 97.9°F | Ht 65.0 in | Wt 229.0 lb

## 2024-03-29 DIAGNOSIS — Z6838 Body mass index (BMI) 38.0-38.9, adult: Secondary | ICD-10-CM

## 2024-03-29 DIAGNOSIS — R632 Polyphagia: Secondary | ICD-10-CM | POA: Diagnosis not present

## 2024-03-29 DIAGNOSIS — E66812 Obesity, class 2: Secondary | ICD-10-CM

## 2024-03-29 DIAGNOSIS — F5089 Other specified eating disorder: Secondary | ICD-10-CM

## 2024-03-29 DIAGNOSIS — F509 Eating disorder, unspecified: Secondary | ICD-10-CM

## 2024-03-29 MED ORDER — TOPIRAMATE 50 MG PO TABS: 50.0000 mg | ORAL_TABLET | Freq: Every day | ORAL | 0 refills | Status: DC

## 2024-03-29 NOTE — Progress Notes (Signed)
 WEIGHT SUMMARY AND BIOMETRICS  Weight Lost Since Last Visit: 0  Weight Gained Since Last Visit: 3lb   Vitals Temp: 97.9 F (36.6 C) BP: (!) 144/76 Pulse Rate: 79 SpO2: 98 %   Anthropometric Measurements Height: 5\' 5"  (1.651 m) Weight: 229 lb (103.9 kg) BMI (Calculated): 38.11 Weight at Last Visit: 226lb Weight Lost Since Last Visit: 0 Weight Gained Since Last Visit: 3lb Starting Weight: 287lb Total Weight Loss (lbs): 58 lb (26.3 kg)   Body Composition  Body Fat %: 46.5 % Fat Mass (lbs): 106.6 lbs Muscle Mass (lbs): 116.4 lbs Total Body Water (lbs): 86.6 lbs Visceral Fat Rating : 16   Other Clinical Data Fasting: no Labs: no Today's Visit #: 36 Starting Date: 12/22/19    OBESITY Jackie Montoya is here to discuss her progress with her obesity treatment plan along with follow-up of her obesity related diagnoses.    Nutrition Plan: keeping a food journal with goal of 1300 calories and 80 grams of protein daily - 50% adherence.  Current exercise:  Exercise bike/Stretches.  Interim History:  Her weight is up 3 lbs since her last visit. She states that she has been craving sweets more.  Eating all of the food on the plan., Is not skipping meals, Meeting protein goals., Water intake is adequate., and Reports excessive cravings.   Pharmacotherapy: Jackie Montoya is on Ozempic 2 mg SQ weekly Adverse side effects: None Hunger is moderately controlled.  Cravings are moderately controlled.  Assessment/Plan:   Jackie Montoya endorses excessive hunger.  Medication(s): Ozempic Effects of medication:  moderately controlled. Cravings are poorly controlled.   Plan: Medication(s): Ozempic 2 mg SQ weekly Will increase water, protein and fiber to help assuage hunger.  Will minimize foods that have a high glucose index/load to minimize reactive hypoglycemia.   Eating  disorder/emotional eating Jackie Montoya has had issues with stress eating, emotional eating, and boredom eating. Currently this is poorly controlled. Overall mood is stable. Denies suicidal/homicidal ideation. Medication(s): She had been on Topamax 50 mg daily with dinner.  She did not take the Topamax except for a few days because she was concerned about the side effects.  She is now willing to try the medication.  She denies any absolute or relative contraindications.  Plan:  Motivational interviewing as well as evidence-based interventions for health behavior change were utilized today including the discussion of self monitoring techniques, problem-solving barriers and SMART goal setting techniques.  Consider a referral to Dr. Delaine Favorite, PhD psychologist to help with eating behaviors.  Discussed distractions to curb eating behaviors. Discussed activities to do with one's hands in the evening  Be sure to get adequate rest as lack of rest can trigger appetite.  Rx: Topamax 50 mg 1 p.o. in the evening with supper #30 with no refills.  Labs reviewed  today (CMP, Lipids, HgbA1c, insulin, vitamin D).    Morbid Obesity:  Current BMI BMI (Calculated): 38.11   Pharmacotherapy Plan Continue  Ozempic 2 mg SQ weekly  Jackie Montoya is currently in the action stage of change. As such, her goal is to continue with weight loss efforts.  She has agreed to keeping a food journal with goal of 1,300 calories and 80 grams of protein daily.  Exercise goals: Older adults should follow the adult guidelines. When older adults cannot meet the adult guidelines, they should be as physically active as their abilities and conditions will allow.   Behavioral modification strategies: increasing lean protein intake, no meal skipping, meal planning , increase water intake, better snacking choices, planning for success, increasing vegetables, ways to avoid night time snacking, keep healthy foods in the home, and weigh protein  portions.  [name] has agreed to follow-up with our clinic in 4 weeks.       Objective:   VITALS: Per patient if applicable, see vitals. GENERAL: Alert and in no acute distress. CARDIOPULMONARY: No increased WOB. Speaking in clear sentences.  PSYCH: Pleasant and cooperative. Speech normal rate and rhythm. Affect is appropriate. Insight and judgement are appropriate. Attention is focused, linear, and appropriate.  NEURO: Oriented as arrived to appointment on time with no prompting.   Attestation Statements:    This was prepared with the assistance of Engineer, civil (consulting).  Occasional wrong-word or sound-a-like substitutions may have occurred due to the inherent limitations of voice recognition   Kirk Peper, DO

## 2024-04-01 ENCOUNTER — Other Ambulatory Visit: Payer: Self-pay | Admitting: Internal Medicine

## 2024-04-01 DIAGNOSIS — Z9889 Other specified postprocedural states: Secondary | ICD-10-CM

## 2024-04-15 DIAGNOSIS — E782 Mixed hyperlipidemia: Secondary | ICD-10-CM | POA: Diagnosis not present

## 2024-04-15 DIAGNOSIS — F419 Anxiety disorder, unspecified: Secondary | ICD-10-CM | POA: Diagnosis not present

## 2024-04-15 DIAGNOSIS — G4733 Obstructive sleep apnea (adult) (pediatric): Secondary | ICD-10-CM | POA: Diagnosis not present

## 2024-04-15 DIAGNOSIS — R7303 Prediabetes: Secondary | ICD-10-CM | POA: Diagnosis not present

## 2024-04-15 DIAGNOSIS — K219 Gastro-esophageal reflux disease without esophagitis: Secondary | ICD-10-CM | POA: Diagnosis not present

## 2024-04-15 DIAGNOSIS — J309 Allergic rhinitis, unspecified: Secondary | ICD-10-CM | POA: Diagnosis not present

## 2024-04-15 DIAGNOSIS — I10 Essential (primary) hypertension: Secondary | ICD-10-CM | POA: Diagnosis not present

## 2024-04-15 DIAGNOSIS — F900 Attention-deficit hyperactivity disorder, predominantly inattentive type: Secondary | ICD-10-CM | POA: Diagnosis not present

## 2024-04-26 ENCOUNTER — Ambulatory Visit (INDEPENDENT_AMBULATORY_CARE_PROVIDER_SITE_OTHER): Admitting: Bariatrics

## 2024-04-26 ENCOUNTER — Encounter: Payer: Self-pay | Admitting: Bariatrics

## 2024-04-26 VITALS — BP 139/85 | HR 78 | Temp 98.6°F | Ht 65.0 in | Wt 227.0 lb

## 2024-04-26 DIAGNOSIS — E782 Mixed hyperlipidemia: Secondary | ICD-10-CM

## 2024-04-26 DIAGNOSIS — R632 Polyphagia: Secondary | ICD-10-CM | POA: Diagnosis not present

## 2024-04-26 DIAGNOSIS — Z6837 Body mass index (BMI) 37.0-37.9, adult: Secondary | ICD-10-CM

## 2024-04-26 DIAGNOSIS — E669 Obesity, unspecified: Secondary | ICD-10-CM | POA: Diagnosis not present

## 2024-04-26 DIAGNOSIS — E785 Hyperlipidemia, unspecified: Secondary | ICD-10-CM

## 2024-04-26 MED ORDER — OZEMPIC (2 MG/DOSE) 8 MG/3ML ~~LOC~~ SOPN: PEN_INJECTOR | SUBCUTANEOUS | 0 refills | Status: DC

## 2024-04-26 NOTE — Progress Notes (Signed)
 WEIGHT SUMMARY AND BIOMETRICS  Weight Lost Since Last Visit: 2lb  Weight Gained Since Last Visit: 0   Vitals Temp: 98.6 F (37 C) BP: 139/85 Pulse Rate: 78 SpO2: 96 %   Anthropometric Measurements Height: 5\' 5"  (1.651 m) Weight: 227 lb (103 kg) BMI (Calculated): 37.77 Weight at Last Visit: 229lb Weight Lost Since Last Visit: 2lb Weight Gained Since Last Visit: 0 Starting Weight: 287lb Total Weight Loss (lbs): 60 lb (27.2 kg)   Body Composition  Body Fat %: 50.2 % Fat Mass (lbs): 114.4 lbs Muscle Mass (lbs): 107.6 lbs Total Body Water (lbs): 82.8 lbs Visceral Fat Rating : 17   Other Clinical Data Fasting: no Labs: no Today's Visit #: 60 Starting Date: 12/22/19    OBESITY Daleyzah is here to discuss her progress with her obesity treatment plan along with follow-up of her obesity related diagnoses.    Nutrition Plan: keeping a food journal with goal of 1300 calories and 80 grams of protein daily - 75% adherence.  Current exercise: Stationary bike.  Interim History:  She is down 2 lbs since her last visit  Eating all of the food on the plan., Protein intake is as prescribed, Is not skipping meals, and Denies polyphagia   Pharmacotherapy: Morireoluwa is on Ozempic  2 mg SQ weekly Adverse side effects: None Hunger is moderately controlled.  Cravings are moderately controlled.  Assessment/Plan:    Chizara Woehrle endorses excessive hunger.  Medication(s): Ozempic   She was taking 1 in the AM and 1 in the PM but could not tolerate and stopped the medication in the PM.  She also stopped the Topamax  because she states she had some dizziness. Effects of medication:  moderately controlled. Cravings are moderately controlled.   Plan: Medication(s): Ozempic  2 mg SQ weekly and metformin  1 in the AM.  Will increase water, protein and fiber to help assuage  hunger.  Will minimize foods that have a high glucose index/load to minimize reactive hypoglycemia.  She will continue the metformin  in the a.m. and will always take it with food. She will not resume Topamax .   Hyperlipidemia LDL is at goal. Medication(s): Lipitor Cardiovascular risk factors: advanced age (older than 76 for men, 16 for women), dyslipidemia, hypertension, obesity (BMI >= 30 kg/m2), and sedentary lifestyle  Lab Results  Component Value Date   CHOL 179 03/01/2024   HDL 85 03/01/2024   LDLCALC 81 03/01/2024   TRIG 68 03/01/2024   Lab Results  Component Value Date   ALT 12 03/01/2024   AST 17 03/01/2024   ALKPHOS 104 03/01/2024   BILITOT 0.5 03/01/2024   The 10-year ASCVD risk score (Arnett DK, et al., 2019) is: 19.1%   Values used to calculate the score:     Age: 60 years     Sex: Female     Is Non-Hispanic African American: No  Diabetic: No     Tobacco smoker: No     Systolic Blood Pressure: 139 mmHg     Is BP treated: Yes     HDL Cholesterol: 85 mg/dL     Total Cholesterol: 179 mg/dL  Plan:  Continue statin.  Will avoid all trans fats.  Will read labels Will minimize saturated fats except the following: low fat meats in moderation, diary, and limited dark chocolate.  Increase Omega 3 in foods, and consider an Omega 3 supplement.  She will begin using collagen powder to put in her shakes or in her yogurt. Handouts given for high-protein foods.  Generalized Obesity: Current BMI BMI (Calculated): 37.77   Pharmacotherapy Plan Continue and refill  Ozempic  2 mg SQ weekly  Zeba is currently in the action stage of change. As such, her goal is to continue with weight loss efforts.  She has agreed to keeping a food journal with goal of 1,300 calories and 80 grams of protein daily.  Exercise goals: Older adults should determine their level of effort for physical activity relative to their level of fitness.   Behavioral modification strategies:  increasing lean protein intake, planning for success, increasing vegetables, and decrease snacking .  Pariss has agreed to follow-up with our clinic in 4 weeks.    Objective:   VITALS: Per patient if applicable, see vitals. GENERAL: Alert and in no acute distress. CARDIOPULMONARY: No increased WOB. Speaking in clear sentences.  PSYCH: Pleasant and cooperative. Speech normal rate and rhythm. Affect is appropriate. Insight and judgement are appropriate. Attention is focused, linear, and appropriate.  NEURO: Oriented as arrived to appointment on time with no prompting.   Attestation Statements:   This was prepared with the assistance of Engineer, civil (consulting).  Occasional wrong-word or sound-a-like substitutions may have occurred due to the inherent limitations of voice recognition    Kirk Peper, DO

## 2024-05-11 ENCOUNTER — Ambulatory Visit
Admission: RE | Admit: 2024-05-11 | Discharge: 2024-05-11 | Disposition: A | Discharge status: Home / Self Care | Source: Ambulatory Visit | Attending: Internal Medicine | Admitting: Internal Medicine

## 2024-05-11 DIAGNOSIS — Z853 Personal history of malignant neoplasm of breast: Secondary | ICD-10-CM | POA: Diagnosis not present

## 2024-05-11 DIAGNOSIS — Z9889 Other specified postprocedural states: Secondary | ICD-10-CM

## 2024-05-11 DIAGNOSIS — Z08 Encounter for follow-up examination after completed treatment for malignant neoplasm: Secondary | ICD-10-CM | POA: Diagnosis not present

## 2024-05-27 ENCOUNTER — Encounter: Payer: Self-pay | Admitting: Bariatrics

## 2024-05-27 ENCOUNTER — Ambulatory Visit: Admitting: Bariatrics

## 2024-05-27 VITALS — BP 148/80 | HR 76 | Temp 97.8°F | Ht 65.0 in | Wt 226.0 lb

## 2024-05-27 DIAGNOSIS — Z6837 Body mass index (BMI) 37.0-37.9, adult: Secondary | ICD-10-CM

## 2024-05-27 DIAGNOSIS — E669 Obesity, unspecified: Secondary | ICD-10-CM | POA: Diagnosis not present

## 2024-05-27 DIAGNOSIS — R632 Polyphagia: Secondary | ICD-10-CM

## 2024-05-27 DIAGNOSIS — R7301 Impaired fasting glucose: Secondary | ICD-10-CM | POA: Diagnosis not present

## 2024-05-27 NOTE — Progress Notes (Signed)
 WEIGHT SUMMARY AND BIOMETRICS  Weight Lost Since Last Visit: 1lb  Weight Gained Since Last Visit: 0   Vitals Temp: 97.8 F (36.6 C) BP: (!) 148/80 (did not take bp meds) Pulse Rate: 76 SpO2: 96 %   Anthropometric Measurements Height: 5' 5 (1.651 m) Weight: 226 lb (102.5 kg) BMI (Calculated): 37.61 Weight at Last Visit: 227lb Weight Lost Since Last Visit: 1lb Weight Gained Since Last Visit: 0 Starting Weight: 287lb Total Weight Loss (lbs): 61 lb (27.7 kg)   Body Composition  Body Fat %: 51.2 % Fat Mass (lbs): 116 lbs Muscle Mass (lbs): 104.8 lbs Visceral Fat Rating : 17   Other Clinical Data Fasting: no Labs: no Today's Visit #: 65 Starting Date: 12/22/19    OBESITY Jackie Montoya is here to discuss her progress with her obesity treatment plan along with follow-up of her obesity related diagnoses.    Nutrition Plan: keeping a food journal with goal of 1300 calories and 80 grams of protein daily - 70-75% adherence.  Current exercise: bicycling and yard work  Interim History:  She is down 1 lb since her last visit. She has not been taking her medications as directed. Eating all of the food on the plan., Protein intake is as prescribed, Is not skipping meals, and Denies polyphagia   Pharmacotherapy: Jackie Montoya is on Ozempic  2 mg SQ weekly and Metformin  500 mg twice daily with meals Adverse side effects: None Hunger is moderately controlled.  Cravings are moderately controlled.  Assessment/Plan:   1. Polyphagia Polyphagia Jackie Montoya endorses excessive hunger.  Medication(s): Ozempic  Effects of medication:  moderately controlled. Cravings are moderately controlled.   Plan: Medication(s): Ozempic  2 mg SQ weekly Will increase water, protein and fiber to help assuage hunger.  Will minimize foods that have a high glucose index/load to minimize reactive  hypoglycemia.   Impaired fasting glucose:   She has a history of impaired fasting glucose with insulin  resistance.  She is taking Ozempic  which she is tolerating well and it has been helping with both her insulin  and her appetite.   Plan: Will continue to monitor her over time. She will continue her plan at 80 to 95%. He will continue exercise both cardio and resistance. She will continue to minimize her refined carbs and substitute with complex carbs.   Generalized Obesity: Current BMI BMI (Calculated): 37.61   Pharmacotherapy Plan Continue  Ozempic  2 mg SQ weekly and Metformin  500 mg twice daily with meals  Jackie Montoya is currently in the action stage of change. As such, her goal is to continue with weight loss efforts.  She has agreed to keeping a food journal with goal of 1,300 calories and 80 grams of protein daily.  Exercise goals: Older adults should follow the adult guidelines. When older adults cannot meet the adult guidelines, they should be as physically active as their abilities and conditions will allow.  Behavioral modification strategies: increasing lean protein intake, meal planning , increase water intake, better snacking choices, planning for success, and mindful eating.  Jackie Montoya has agreed to follow-up with our clinic in 4 weeks.       Objective:   VITALS: Per patient if applicable, see vitals. GENERAL: Alert and in no acute distress. CARDIOPULMONARY: No increased WOB. Speaking in clear sentences.  PSYCH: Pleasant and cooperative. Speech normal rate and rhythm. Affect is appropriate. Insight and judgement are appropriate. Attention is focused, linear, and appropriate.  NEURO: Oriented as arrived to appointment on time with no prompting.   Attestation Statements:   This was prepared with the assistance of Engineer, civil (consulting).  Occasional wrong-word or sound-a-like substitutions may have occurred due to the inherent limitations of voice recognition   Kirk Peper, DO

## 2024-05-27 NOTE — Progress Notes (Deleted)
                                                                                                              WEIGHT SUMMARY AND BIOMETRICS  Weight Lost Since Last Visit: 1lb  Weight Gained Since Last Visit: 0   Vitals Temp: 97.8 F (36.6 C) BP: (!) 148/80 (did not take bp meds) Pulse Rate: 76 SpO2: 96 %   Anthropometric Measurements Height: 5' 5 (1.651 m) Weight: 226 lb (102.5 kg) BMI (Calculated): 37.61 Weight at Last Visit: 227lb Weight Lost Since Last Visit: 1lb Weight Gained Since Last Visit: 0 Starting Weight: 287lb Total Weight Loss (lbs): 61 lb (27.7 kg)   Body Composition  Body Fat %: 51.2 % Fat Mass (lbs): 116 lbs Muscle Mass (lbs): 104.8 lbs Visceral Fat Rating : 17   Other Clinical Data Fasting: no Labs: no Today's Visit #: 21 Starting Date: 12/22/19    OBESITY Jackie Montoya is here to discuss her progress with her obesity treatment plan along with follow-up of her obesity related diagnoses.    Nutrition Plan: the Category 2 plan - 70-75% adherence.  Current exercise: bicycling and yard work  Interim History:  *** {aabnutritionassessment:29213}   Pharmacotherapy: Jackie Montoya is on {dwwpharmacotherapy:29109} Adverse side effects: {dwwse:29122} Hunger is {EWCONTROLASSESSMENT:24261}.  Cravings are {EWCONTROLASSESSMENT:24261}.  Assessment/Plan:   There are no diagnoses linked to this encounter.    {dwwmorbid:29108::Morbid Obesity}: Current BMI BMI (Calculated): 37.61   Pharmacotherapy Plan {dwwmed:29123}  {dwwpharmacotherapy:29109}  Jackie Montoya {CHL AMB IS/IS NOT:210130109} currently in the action stage of change. As such, her goal is to {MWMwtloss#1:210800005}.  She has agreed to {dwwsldiets:29085}.  Exercise goals: {MWM EXERCISE RECS:23473}  Behavioral modification strategies: {dwwslwtlossstrategies:29088}.  Jackie Montoya has agreed to follow-up with our clinic in {NUMBER 1-10:22536} weeks.   No orders of the defined types were placed in this  encounter.   There are no discontinued medications.   No orders of the defined types were placed in this encounter.     Objective:   VITALS: Per patient if applicable, see vitals. GENERAL: Alert and in no acute distress. CARDIOPULMONARY: No increased WOB. Speaking in clear sentences.  PSYCH: Pleasant and cooperative. Speech normal rate and rhythm. Affect is appropriate. Insight and judgement are appropriate. Attention is focused, linear, and appropriate.  NEURO: Oriented as arrived to appointment on time with no prompting.   Attestation Statements:   This was prepared with the assistance of Engineer, civil (consulting).  Occasional wrong-word or sound-a-like substitutions may have occurred due to the inherent limitations of voice recognition

## 2024-07-08 ENCOUNTER — Encounter: Payer: Self-pay | Admitting: Bariatrics

## 2024-07-08 ENCOUNTER — Ambulatory Visit (INDEPENDENT_AMBULATORY_CARE_PROVIDER_SITE_OTHER): Admitting: Bariatrics

## 2024-07-08 VITALS — BP 123/83 | HR 80 | Temp 97.8°F | Ht 65.0 in | Wt 226.0 lb

## 2024-07-08 DIAGNOSIS — E669 Obesity, unspecified: Secondary | ICD-10-CM

## 2024-07-08 DIAGNOSIS — R632 Polyphagia: Secondary | ICD-10-CM

## 2024-07-08 DIAGNOSIS — Z6837 Body mass index (BMI) 37.0-37.9, adult: Secondary | ICD-10-CM | POA: Diagnosis not present

## 2024-07-08 DIAGNOSIS — I10 Essential (primary) hypertension: Secondary | ICD-10-CM | POA: Diagnosis not present

## 2024-07-08 DIAGNOSIS — E66812 Obesity, class 2: Secondary | ICD-10-CM

## 2024-07-08 MED ORDER — OZEMPIC (2 MG/DOSE) 8 MG/3ML ~~LOC~~ SOPN: PEN_INJECTOR | SUBCUTANEOUS | 0 refills | Status: DC

## 2024-07-08 NOTE — Progress Notes (Signed)
 WEIGHT SUMMARY AND BIOMETRICS  Weight Lost Since Last Visit: 0  Weight Gained Since Last Visit: 0   Vitals Temp: 97.8 F (36.6 C) BP: 123/83 Pulse Rate: 80 SpO2: 96 %   Anthropometric Measurements Height: 5' 5 (1.651 m) Weight: 226 lb (102.5 kg) BMI (Calculated): 37.61 Weight at Last Visit: 226lb Weight Lost Since Last Visit: 0 Weight Gained Since Last Visit: 0 Starting Weight: 287lb Total Weight Loss (lbs): 61 lb (27.7 kg)   Body Composition  Body Fat %: 50 % Fat Mass (lbs): 113.2 lbs Muscle Mass (lbs): 107.2 lbs Total Body Water (lbs): 84 lbs Visceral Fat Rating : 17   Other Clinical Data Fasting: no Labs: no Today's Visit #: 2 Starting Date: 12/22/19    OBESITY Jackie Montoya is here to discuss her progress with her obesity treatment plan along with follow-up of her obesity related diagnoses.    Nutrition Plan: keeping a food journal with goal of 1300 calories and 80 grams of protein daily and with breakfast options - 65% adherence.  Current exercise: yard work and stationary bike.  Interim History:  Her weight remains the same.  Eating all of the food on the plan., Protein intake is as prescribed, Is not skipping meals, Water intake is adequate., and Reports excessive cravings.   Pharmacotherapy: Jackie Montoya is on Ozempic  2 mg SQ weekly Adverse side effects: None Hunger is moderately controlled.  Cravings are moderately controlled.  Assessment/Plan:   Jackie Montoya endorses excessive hunger.  Medication(s): Ozempic  2 mg Effects of medication:  moderately controlled. Cravings are moderately controlled.   Plan: Medication(s): Ozempic  2 mg SQ weekly Will increase water, protein and fiber to help assuage hunger.  Will minimize foods that have a high glucose index/load to minimize reactive hypoglycemia.  She will eat less ice cream.    Hypertension Hypertension control uncertain.  Medication(s): Hydrochlorothiazide 25 mg daily   BP Readings from Last 3 Encounters:  07/08/24 123/83  05/27/24 (!) 148/80  04/26/24 139/85   Lab Results  Component Value Date   CREATININE 0.69 03/01/2024   CREATININE 0.77 04/23/2023   CREATININE 0.76 06/05/2022   No results found for: GFR  Plan: Continue all antihypertensives at current dosages. No added salt. Will keep sodium content to 1,500 mg or less per day.   She will check her blood pressure periodically.     Generalized Obesity: Current BMI BMI (Calculated): 37.61   Pharmacotherapy Plan Continue and refill  Ozempic  2 mg SQ weekly and Metformin  500 daily.   Jackie Montoya is currently in the action stage of change. As such, her goal is to continue with weight loss efforts.  She has agreed to keeping a food journal with goal of 1,300 calories and 80 grams of protein daily.  Exercise goals: Older adults should determine their level of effort for physical activity relative to their level of fitness.  Behavioral modification strategies: increasing lean protein intake, no meal skipping, meal planning , better snacking choices, planning for success, increasing vegetables, keep healthy foods in the home, weigh protein portions, and mindful eating.  Jackie Montoya has agreed to follow-up with our clinic in 4 weeks.    Objective:   VITALS: Per patient if applicable, see vitals. GENERAL: Alert and in no acute distress. CARDIOPULMONARY: No increased WOB. Speaking in clear sentences.  PSYCH: Pleasant and cooperative. Speech normal rate and rhythm. Affect is appropriate. Insight and judgement are appropriate. Attention is focused, linear, and appropriate.  NEURO: Oriented as arrived to appointment on time with no prompting.   Attestation Statements:   This was prepared with the assistance of Engineer, civil (consulting).  Occasional wrong-word or sound-a-like substitutions may have occurred  due to the inherent limitations of voice recognition   Clayborne Daring, DO

## 2024-07-19 DIAGNOSIS — G4733 Obstructive sleep apnea (adult) (pediatric): Secondary | ICD-10-CM | POA: Diagnosis not present

## 2024-07-19 DIAGNOSIS — I10 Essential (primary) hypertension: Secondary | ICD-10-CM | POA: Diagnosis not present

## 2024-08-05 ENCOUNTER — Encounter: Payer: Self-pay | Admitting: Bariatrics

## 2024-08-05 ENCOUNTER — Ambulatory Visit: Admitting: Bariatrics

## 2024-08-05 VITALS — BP 139/70 | HR 77 | Temp 99.3°F | Ht 65.0 in | Wt 225.0 lb

## 2024-08-05 DIAGNOSIS — E782 Mixed hyperlipidemia: Secondary | ICD-10-CM

## 2024-08-05 DIAGNOSIS — Z6837 Body mass index (BMI) 37.0-37.9, adult: Secondary | ICD-10-CM | POA: Diagnosis not present

## 2024-08-05 DIAGNOSIS — E669 Obesity, unspecified: Secondary | ICD-10-CM

## 2024-08-05 DIAGNOSIS — E785 Hyperlipidemia, unspecified: Secondary | ICD-10-CM | POA: Diagnosis not present

## 2024-08-05 NOTE — Progress Notes (Signed)
 WEIGHT SUMMARY AND BIOMETRICS  Weight Lost Since Last Visit: 1lb  Weight Gained Since Last Visit: 0   Vitals Temp: 99.3 F (37.4 C) BP: 139/70 Pulse Rate: 77 SpO2: 97 %   Anthropometric Measurements Height: 5' 5 (1.651 m) Weight: 225 lb (102.1 kg) BMI (Calculated): 37.44 Weight at Last Visit: 226lb Weight Lost Since Last Visit: 1lb Weight Gained Since Last Visit: 0 Starting Weight: 287lb Total Weight Loss (lbs): 62 lb (28.1 kg)   Body Composition  Body Fat %: 49 % Fat Mass (lbs): 110.4 lbs Muscle Mass (lbs): 109.2 lbs Total Body Water (lbs): 80.8 lbs Visceral Fat Rating : 16   Other Clinical Data Fasting: no Labs: no Today's Visit #: 75 Starting Date: 12/22/19    OBESITY Jackie Montoya is here to discuss her progress with her obesity treatment plan along with follow-up of her obesity related diagnoses.    Nutrition Plan: keeping a food journal with goal of 1300 calories and 80 grams of protein daily - 75% adherence.  Current exercise: yard work and stationary bike  Interim History:  She is down 1 lb since her last visit. She is at her lowest weight at our clinic.  Eating all of the food on the plan., Protein intake is as prescribed, Is skipping meals, and Water intake is adequate.   Pharmacotherapy: Lavayah is on Ozempic  2 mg SQ weekly Adverse side effects: None Hunger is moderately controlled.  Cravings are moderately controlled.  Assessment/Plan:   Hyperlipidemia LDL is not at goal. Medication(s): Lipitor Cardiovascular risk factors: advanced age (older than 57 for men, 57 for women), diabetes mellitus, dyslipidemia, and obesity (BMI >= 30 kg/m2)  Lab Results  Component Value Date   CHOL 179 03/01/2024   HDL 85 03/01/2024   LDLCALC 81 03/01/2024   TRIG 68 03/01/2024   Lab Results  Component Value Date   ALT 12 03/01/2024   AST 17  03/01/2024   ALKPHOS 104 03/01/2024   BILITOT 0.5 03/01/2024   The 10-year ASCVD risk score (Arnett DK, et al., 2019) is: 19.1%   Values used to calculate the score:     Age: 73 years     Clincally relevant sex: Female     Is Non-Hispanic African American: No     Diabetic: No     Tobacco smoker: No     Systolic Blood Pressure: 139 mmHg     Is BP treated: Yes     HDL Cholesterol: 85 mg/dL     Total Cholesterol: 179 mg/dL  Plan:  Continue statin.  Will avoid all trans fats.  Will read labels Will minimize saturated fats except the following: low fat meats in moderation, diary, and limited dark chocolate.  Increase Omega 3 in foods, and consider an Omega 3 supplement.    Generalized Obesity: Current BMI BMI (Calculated): 37.44   Pharmacotherapy Plan Continue  Ozempic   2 mg SQ weekly  Brycelyn is currently in the action stage of change. As such, her goal is to continue with weight loss efforts.  She has agreed to keeping a food journal with goal of 1,300 calories and 80 grams of protein daily.  Exercise goals: Older adults should determine their level of effort for physical activity relative to their level of fitness.   Behavioral modification strategies: increasing lean protein intake, no meal skipping, meal planning , increase water intake, better snacking choices, planning for success, increasing vegetables, avoiding temptations, and mindful eating.  Nykeria has agreed to follow-up with our clinic in 4 weeks.   Objective:   VITALS: Per patient if applicable, see vitals. GENERAL: Alert and in no acute distress. CARDIOPULMONARY: No increased WOB. Speaking in clear sentences.  PSYCH: Pleasant and cooperative. Speech normal rate and rhythm. Affect is appropriate. Insight and judgement are appropriate. Attention is focused, linear, and appropriate.  NEURO: Oriented as arrived to appointment on time with no prompting.   Attestation Statements:   This was prepared with the assistance  of Engineer, civil (consulting).  Occasional wrong-word or sound-a-like substitutions may have occurred due to the inherent limitations of voice recognition   Clayborne Daring, DO

## 2024-08-17 ENCOUNTER — Encounter: Payer: Self-pay | Admitting: Sports Medicine

## 2024-08-26 DIAGNOSIS — Z23 Encounter for immunization: Secondary | ICD-10-CM | POA: Diagnosis not present

## 2024-09-02 ENCOUNTER — Encounter: Payer: Self-pay | Admitting: Podiatry

## 2024-09-02 ENCOUNTER — Ambulatory Visit (INDEPENDENT_AMBULATORY_CARE_PROVIDER_SITE_OTHER): Admitting: Podiatry

## 2024-09-02 ENCOUNTER — Ambulatory Visit (INDEPENDENT_AMBULATORY_CARE_PROVIDER_SITE_OTHER)

## 2024-09-02 ENCOUNTER — Telehealth: Payer: Self-pay | Admitting: *Deleted

## 2024-09-02 DIAGNOSIS — M19072 Primary osteoarthritis, left ankle and foot: Secondary | ICD-10-CM | POA: Diagnosis not present

## 2024-09-02 DIAGNOSIS — M7662 Achilles tendinitis, left leg: Secondary | ICD-10-CM

## 2024-09-02 DIAGNOSIS — M25572 Pain in left ankle and joints of left foot: Secondary | ICD-10-CM

## 2024-09-02 DIAGNOSIS — M7750 Other enthesopathy of unspecified foot: Secondary | ICD-10-CM

## 2024-09-02 DIAGNOSIS — M2142 Flat foot [pes planus] (acquired), left foot: Secondary | ICD-10-CM | POA: Diagnosis not present

## 2024-09-02 DIAGNOSIS — M7672 Peroneal tendinitis, left leg: Secondary | ICD-10-CM

## 2024-09-02 MED ORDER — MELOXICAM 15 MG PO TABS: 15.0000 mg | ORAL_TABLET | Freq: Every day | ORAL | 0 refills | Status: DC

## 2024-09-02 NOTE — Telephone Encounter (Signed)
 I left the patient a message asking her to arrive at 1:40 pm because we need to get xrays of her foot and ankle.

## 2024-09-02 NOTE — Patient Instructions (Signed)
Achilles Tendinitis  with Rehab Achilles tendinitis is a disorder of the Achilles tendon. The Achilles tendon connects the large calf muscles (Gastrocnemius and Soleus) to the heel bone (calcaneus). This tendon is sometimes called the heel cord. It is important for pushing-off and standing on your toes and is important for walking, running, or jumping. Tendinitis is often caused by overuse and repetitive microtrauma. SYMPTOMS  Pain, tenderness, swelling, warmth, and redness may occur over the Achilles tendon even at rest.  Pain with pushing off, or flexing or extending the ankle.  Pain that is worsened after or during activity. CAUSES   Overuse sometimes seen with rapid increase in exercise programs or in sports requiring running and jumping.  Poor physical conditioning (strength and flexibility or endurance).  Running sports, especially training running down hills.  Inadequate warm-up before practice or play or failure to stretch before participation.  Injury to the tendon. PREVENTION   Warm up and stretch before practice or competition.  Allow time for adequate rest and recovery between practices and competition.  Keep up conditioning.  Keep up ankle and leg flexibility.  Improve or keep muscle strength and endurance.  Improve cardiovascular fitness.  Use proper technique.  Use proper equipment (shoes, skates).  To help prevent recurrence, taping, protective strapping, or an adhesive bandage may be recommended for several weeks after healing is complete. PROGNOSIS   Recovery may take weeks to several months to heal.  Longer recovery is expected if symptoms have been prolonged.  Recovery is usually quicker if the inflammation is due to a direct blow as compared with overuse or sudden strain. RELATED COMPLICATIONS   Healing time will be prolonged if the condition is not correctly treated. The injury must be given plenty of time to heal.  Symptoms can reoccur if  activity is resumed too soon.  Untreated, tendinitis may increase the risk of tendon rupture requiring additional time for recovery and possibly surgery. TREATMENT   The first treatment consists of rest anti-inflammatory medication, and ice to relieve the pain.  Stretching and strengthening exercises after resolution of pain will likely help reduce the risk of recurrence. Referral to a physical therapist or athletic trainer for further evaluation and treatment may be helpful.  A walking boot or cast may be recommended to rest the Achilles tendon. This can help break the cycle of inflammation and microtrauma.  Arch supports (orthotics) may be prescribed or recommended by your caregiver as an adjunct to therapy and rest.  Surgery to remove the inflamed tendon lining or degenerated tendon tissue is rarely necessary and has shown less than predictable results. MEDICATION   Nonsteroidal anti-inflammatory medications, such as aspirin and ibuprofen, may be used for pain and inflammation relief. Do not take within 7 days before surgery. Take these as directed by your caregiver. Contact your caregiver immediately if any bleeding, stomach upset, or signs of allergic reaction occur. Other minor pain relievers, such as acetaminophen, may also be used.  Pain relievers may be prescribed as necessary by your caregiver. Do not take prescription pain medication for longer than 4 to 7 days. Use only as directed and only as much as you need.  Cortisone injections are rarely indicated. Cortisone injections may weaken tendons and predispose to rupture. It is better to give the condition more time to heal than to use them. HEAT AND COLD  Cold is used to relieve pain and reduce inflammation for acute and chronic Achilles tendinitis. Cold should be applied for 10 to 15 minutes   and  strengthening activities prescribed by your caregiver. Use a heat pack or a warm soak. SEEK MEDICAL CARE IF: Symptoms get worse or do not improve in 2 weeks despite treatment. New, unexplained symptoms develop. Drugs used in treatment may produce side effects.  EXERCISES:  RANGE OF MOTION (ROM) AND STRETCHING EXERCISES - Achilles Tendinitis  These exercises may help you when beginning to rehabilitate your injury. Your symptoms may resolve with or without further involvement from your physician, physical therapist or athletic trainer. While completing these exercises, remember:  Restoring tissue flexibility helps normal motion to return to the joints. This allows healthier, less painful movement and activity. An effective stretch should be held for at least 30 seconds. A stretch should never be painful. You should only feel a gentle lengthening or release in the stretched tissue.  STRETCH  Gastroc, Standing  Place hands on wall. Extend right / left leg, keeping the front knee somewhat bent. Slightly point your toes inward on your back foot. Keeping your right / left heel on the floor and your knee straight, shift your weight toward the wall, not allowing your back to arch. You should feel a gentle stretch in the right / left calf. Hold this position for 10 seconds. Repeat 3 times. Complete this stretch 2 times per day.  STRETCH  Soleus, Standing  Place hands on wall. Extend right / left leg, keeping the other knee somewhat bent. Slightly point your toes inward on your back foot. Keep your right / left heel on the floor, bend your back knee, and slightly shift your weight over the back leg so that you feel a gentle stretch deep in your back calf. Hold this position for 10 seconds. Repeat 3 times. Complete this stretch 2 times per day.  STRETCH  Gastrocsoleus, Standing  Note: This exercise can place a lot of stress on your foot and ankle. Please complete this exercise only if specifically  instructed by your caregiver.  Place the ball of your right / left foot on a step, keeping your other foot firmly on the same step. Hold on to the wall or a rail for balance. Slowly lift your other foot, allowing your body weight to press your heel down over the edge of the step. You should feel a stretch in your right / left calf. Hold this position for 10 seconds. Repeat this exercise with a slight bend in your knee. Repeat 3 times. Complete this stretch 2 times per day.   STRENGTHENING EXERCISES - Achilles Tendinitis These exercises may help you when beginning to rehabilitate your injury. They may resolve your symptoms with or without further involvement from your physician, physical therapist or athletic trainer. While completing these exercises, remember:  Muscles can gain both the endurance and the strength needed for everyday activities through controlled exercises. Complete these exercises as instructed by your physician, physical therapist or athletic trainer. Progress the resistance and repetitions only as guided. You may experience muscle soreness or fatigue, but the pain or discomfort you are trying to eliminate should never worsen during these exercises. If this pain does worsen, stop and make certain you are following the directions exactly. If the pain is still present after adjustments, discontinue the exercise until you can discuss the trouble with your clinician.  STRENGTH - Plantar-flexors  Sit with your right / left leg extended. Holding onto both ends of a rubber exercise band/tubing, loop it around the ball of your foot. Keep a slight tension in the band. Slowly  push your toes away from you, pointing them downward. Hold this position for 10 seconds. Return slowly, controlling the tension in the band/tubing. Repeat 3 times. Complete this exercise 2 times per day.   STRENGTH - Plantar-flexors  Stand with your feet shoulder width apart. Steady yourself with a wall or table  using as little support as needed. Keeping your weight evenly spread over the width of your feet, rise up on your toes.* Hold this position for 10 seconds. Repeat 3 times. Complete this exercise 2 times per day.  *If this is too easy, shift your weight toward your right / left leg until you feel challenged. Ultimately, you may be asked to do this exercise with your right / left foot only.  STRENGTH  Plantar-flexors, Eccentric  Note: This exercise can place a lot of stress on your foot and ankle. Please complete this exercise only if specifically instructed by your caregiver.  Place the balls of your feet on a step. With your hands, use only enough support from a wall or rail to keep your balance. Keep your knees straight and rise up on your toes. Slowly shift your weight entirely to your right / left toes and pick up your opposite foot. Gently and with controlled movement, lower your weight through your right / left foot so that your heel drops below the level of the step. You will feel a slight stretch in the back of your calf at the end position. Use the healthy leg to help rise up onto the balls of both feet, then lower weight only on the right / left leg again. Build up to 15 repetitions. Then progress to 3 consecutive sets of 15 repetitions.* After completing the above exercise, complete the same exercise with a slight knee bend (about 30 degrees). Again, build up to 15 repetitions. Then progress to 3 consecutive sets of 15 repetitions.* Perform this exercise 2 times per day.  *When you easily complete 3 sets of 15, your physician, physical therapist or athletic trainer may advise you to add resistance by wearing a backpack filled with additional weight.  STRENGTH - Plantar Flexors, Seated  Sit on a chair that allows your feet to rest flat on the ground. If necessary, sit at the edge of the chair. Keeping your toes firmly on the ground, lift your right / left heel as far as you can without  increasing any discomfort in your ankle. Repeat 3 times. Complete this exercise 2 times a day.   Peroneal Tendinopathy Rehab Ask your health care provider which exercises are safe for you. Do exercises exactly as told by your health care provider and adjust them as directed. It is normal to feel mild stretching, pulling, tightness, or discomfort as you do these exercises. Stop right away if you feel sudden pain or your pain gets worse. Do not begin these exercises until told by your health care provider. Stretching and range-of-motion exercises These exercises warm up your muscles and joints. They can help improve the movement and flexibility of your ankle. They may also help to relieve pain and stiffness. Gastrocnemius and soleus stretch, standing This is an exercise in which you stand on a step and use your body weight to stretch your calf muscles. To do this exercise: Stand on the edge of a step on the ball of your left / right foot. The ball of your foot is on the walking surface, right under your toes. Keep your other foot firmly on the same step. Hold  on to the wall, a railing, or a chair for balance. Slowly lift your other foot, allowing your body weight to press your left / right heel down over the edge of the step. You should feel a stretch in your left / right calf (gastrocnemius and soleus). Hold this position for __________ seconds. Return both feet to the step. Repeat this exercise with a slight bend in your left / right knee. Repeat __________ times with your left / right knee straight and __________ times with your left / right knee bent. Complete this exercise __________ times a day. Strengthening exercises These exercises build strength and endurance in your foot and ankle. Endurance is the ability to use your muscles for a long time, even after they get tired. Ankle dorsiflexion with band  Secure a rubber exercise band or tube to an object, such as a table leg, that will not  move when the band is pulled. Secure the other end of the band around your left / right foot. Sit on the floor. Face the object with your left / right leg extended. The band or tube should be slightly tense when your foot is relaxed. Slowly flex your left / right ankle and toes to bring your foot toward you (dorsiflexion). Hold this position for __________ seconds. Let the band or tube slowly pull your foot back to the starting position. Repeat __________ times. Complete this exercise __________ times a day. Ankle eversion  Sit on the floor with your legs straight out in front of you. Loop a rubber exercise band or tube around the ball of your left / right foot. The ball of your foot is on the walking surface, right under your toes. Hold the ends of the band in your hands. You can also secure the band to a stable object. The band or tube should be slightly tense when your foot is relaxed. Slowly push your foot outward, away from your other leg (eversion). Hold this position for __________ seconds. Slowly return your foot to the starting position. Repeat __________ times. Complete this exercise __________ times a day. Plantar flexion, standing This exercise is sometimes called a standing heel raise. Stand with your feet shoulder-width apart. Place your hands on a wall or table to steady yourself as needed. Try not to use it for support. Keep your weight spread evenly over the width of your feet while you slowly rise up on your toes (plantar flexion). If told by your health care provider: Shift your weight toward your left / right leg until you feel challenged. Stand on your left / right leg only. Hold this position for __________ seconds. Repeat __________ times. Complete this exercise __________ times a day. Single leg stand  Without shoes, stand near a railing or in a doorway. You may hold on to the railing or doorframe as needed. Stand on your left / right foot. Keep your big toe down  on the floor and try to keep your arch lifted. Do not roll to the outside of your foot. If this exercise is too easy, you can try it with your eyes closed or while standing on a pillow. Hold this position for __________ seconds. Repeat __________ times. Complete this exercise __________ times a day. This information is not intended to replace advice given to you by your health care provider. Make sure you discuss any questions you have with your health care provider. Document Revised: 03/28/2022 Document Reviewed: 03/28/2022 [employer]vier Patient Education  2024 [employer].

## 2024-09-02 NOTE — Progress Notes (Signed)
  Subjective:  Patient ID: Jackie Montoya, female    DOB: 22-Apr-1951,   MRN: [id number]  Chief Complaint  Patient presents with   Foot Pain    About three months ago I spray Lysol in my bathroom and I slipped and fell.  My left foot has been hurting since then.  I don't know if I sprained it or what.  My toes hurt, I have a knot on that second toe. (Ankle and heel left)    73 y.o. female presents for concern as above. Does have a history of lower back and hip pain and was seeing Dr. ONEIDA. Relates doing therapy and helping some but some pains going down to feet. Relates though first steps after rest are painful and some sore spots around ankel and heels.  . Denies any other pedal complaints. Denies n/v/f/c.   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 CFT <3 seconds. Normal hair growth on digits. No edema.  Skin. No lacerations or abrasions bilateral feet.  Musculoskeletal: MMT 5/5 bilateral lower extremities in DF, PF, Inversion and Eversion. Deceased ROM in DF of ankle joint. Tender to achilles insertion on left. Pain with DF PF eversion and inversion. Pain along peroneal tendon and some pain over anterior ankle.  Neurological: Sensation intact to light touch.   Assessment:   1. Peroneal tendonitis, left   2. Tendonitis, Achilles, left      Plan:  Patient was evaluated and  treated and all questions answered. -Xrays reviewed. No acute fractures or dislocations noted. Skew foot deformity noted.  -Discussed Achilles insertional tendonitis and peroneal tendontisi and treatment options with patient.  -Discussed stretching exercises. -Trilock ankle brace dispensed.  -Rx Meloxicam  provided . CMP reviewed and kidney function wnl.  -Discussed if no improvement will consider MRI/PT/EPAT/PRP injections.  -Patient to return to office in 6 weeks for recheck   Asberry Failing, DPM   ]

## 2024-09-06 DIAGNOSIS — Z23 Encounter for immunization: Secondary | ICD-10-CM | POA: Diagnosis not present

## 2024-09-09 ENCOUNTER — Encounter: Payer: Self-pay | Admitting: Bariatrics

## 2024-09-09 ENCOUNTER — Ambulatory Visit: Admitting: Bariatrics

## 2024-09-09 VITALS — BP 111/70 | HR 77 | Temp 98.6°F | Ht 65.0 in | Wt 228.0 lb

## 2024-09-09 DIAGNOSIS — R7301 Impaired fasting glucose: Secondary | ICD-10-CM

## 2024-09-09 DIAGNOSIS — R632 Polyphagia: Secondary | ICD-10-CM

## 2024-09-09 DIAGNOSIS — E669 Obesity, unspecified: Secondary | ICD-10-CM | POA: Diagnosis not present

## 2024-09-09 DIAGNOSIS — Z6837 Body mass index (BMI) 37.0-37.9, adult: Secondary | ICD-10-CM | POA: Diagnosis not present

## 2024-09-09 MED ORDER — OZEMPIC (2 MG/DOSE) 8 MG/3ML ~~LOC~~ SOPN: PEN_INJECTOR | SUBCUTANEOUS | 0 refills | Status: DC

## 2024-09-09 NOTE — Progress Notes (Unsigned)
 WEIGHT SUMMARY AND BIOMETRICS  Weight Lost Since Last Visit: 0  Weight Gained Since Last Visit: 3lb   Vitals Temp: 98.6 F (37 C) BP: 111/70 Pulse Rate: 77 SpO2: 96 %   Anthropometric Measurements Height: 5' 5 (1.651 m) Weight: 228 lb (103.4 kg) BMI (Calculated): 37.94 Weight at Last Visit: 225lb Weight Lost Since Last Visit: 0 Weight Gained Since Last Visit: 3lb Starting Weight: 287lb Total Weight Loss (lbs): 59 lb (26.8 kg)   Body Composition  Body Fat %: 49.9 % Fat Mass (lbs): 113.8 lbs Muscle Mass (lbs): 108.6 lbs Total Body Water (lbs): 83.6 lbs Visceral Fat Rating : 17   Other Clinical Data Fasting: no Labs: no Today's Visit #: 19 Starting Date: 12/22/19    OBESITY Jackie Montoya is here to discuss her progress with her obesity treatment plan along with follow-up of her obesity related diagnoses.    Nutrition Plan: keeping a food journal with goal of 1300 calories and 80 grams of protein daily - 65% adherence.  Current exercise: Stationary bike and yard work  Interim History:  She is up 3 lbs since her last visit. She wants to start stretching. She states that she has not been taking her Metformin  recently and her appetite has increased.  She states that she has just been forgetting her Metformin  and denies any significant side effects.  Eating all of the food on the plan., Protein intake is as prescribed, Water intake is adequate., and Reports polyphagia   Pharmacotherapy: Jackie Montoya is on Ozempic  2 mg SQ weekly Adverse side effects: None Hunger is moderately controlled.  Cravings are moderately controlled.  Assessment/Plan:   Jackie Montoya endorses excessive hunger.  Medication(s):  Effects of medication ( appetite):  moderately controlled. Cravings are moderately controlled.   Plan: Medication(s): Ozempic  2 mg SQ weekly Will increase  water, protein and fiber to help assuage hunger.  Will minimize foods that have a high glucose index/load to minimize reactive hypoglycemia.  She will resume her metformin  500 mg in the a.m. with a meal and will gradually resume her full dose of 500 mg twice daily with meals.  She currently has enough metformin  and no prescription was written today.  Impaired fasting glucose:   She has a history of impaired fasting glucose.   Plan:  Will continue on the plan and exercise.  Will continue the Ozempic .     Generalized Obesity: Current BMI BMI (Calculated): 37.94   Pharmacotherapy Plan Continue and refill  Ozempic  2 mg SQ weekly  Jackie Montoya is currently in the action stage of change. As such, her goal is to continue with weight loss efforts.  She has agreed to keeping a food journal with goal of 1,300 calories and 80 grams of protein daily.  Exercise goals: Older adults should follow the adult guidelines. When older adults cannot meet the adult guidelines, they should be as  physically active as their abilities and conditions will allow.  She wants to start walking on a regular basis.   Behavioral modification strategies: increasing lean protein intake, no meal skipping, increase water intake, better snacking choices, planning for success, and weigh protein portions.  Jackie Montoya has agreed to follow-up with our clinic in 6 weeks.    Objective:   VITALS: Per patient if applicable, see vitals. GENERAL: Alert and in no acute distress. CARDIOPULMONARY: No increased WOB. Speaking in clear sentences.  PSYCH: Pleasant and cooperative. Speech normal rate and rhythm. Affect is appropriate. Insight and judgement are appropriate. Attention is focused, linear, and appropriate.  NEURO: Oriented as arrived to appointment on time with no prompting.   Attestation Statements:   This was prepared with the assistance of Engineer, civil (consulting).  Occasional wrong-word or sound-a-like substitutions may have  occurred due to the inherent limitations of voice recognition   Clayborne Daring, DO

## 2024-10-12 DIAGNOSIS — R7303 Prediabetes: Secondary | ICD-10-CM | POA: Diagnosis not present

## 2024-10-12 DIAGNOSIS — Z23 Encounter for immunization: Secondary | ICD-10-CM | POA: Diagnosis not present

## 2024-10-12 DIAGNOSIS — Z0000 Encounter for general adult medical examination without abnormal findings: Secondary | ICD-10-CM | POA: Diagnosis not present

## 2024-10-12 DIAGNOSIS — Z1159 Encounter for screening for other viral diseases: Secondary | ICD-10-CM | POA: Diagnosis not present

## 2024-10-12 DIAGNOSIS — F419 Anxiety disorder, unspecified: Secondary | ICD-10-CM | POA: Diagnosis not present

## 2024-10-12 DIAGNOSIS — F900 Attention-deficit hyperactivity disorder, predominantly inattentive type: Secondary | ICD-10-CM | POA: Diagnosis not present

## 2024-10-12 DIAGNOSIS — I10 Essential (primary) hypertension: Secondary | ICD-10-CM | POA: Diagnosis not present

## 2024-10-12 DIAGNOSIS — E782 Mixed hyperlipidemia: Secondary | ICD-10-CM | POA: Diagnosis not present

## 2024-10-12 DIAGNOSIS — M1990 Unspecified osteoarthritis, unspecified site: Secondary | ICD-10-CM | POA: Diagnosis not present

## 2024-10-12 DIAGNOSIS — K219 Gastro-esophageal reflux disease without esophagitis: Secondary | ICD-10-CM | POA: Diagnosis not present

## 2024-10-12 DIAGNOSIS — L989 Disorder of the skin and subcutaneous tissue, unspecified: Secondary | ICD-10-CM | POA: Diagnosis not present

## 2024-10-13 DIAGNOSIS — Z01419 Encounter for gynecological examination (general) (routine) without abnormal findings: Secondary | ICD-10-CM | POA: Diagnosis not present

## 2024-10-15 ENCOUNTER — Ambulatory Visit (INDEPENDENT_AMBULATORY_CARE_PROVIDER_SITE_OTHER): Admitting: Podiatry

## 2024-10-15 ENCOUNTER — Encounter: Payer: Self-pay | Admitting: Podiatry

## 2024-10-15 DIAGNOSIS — M7662 Achilles tendinitis, left leg: Secondary | ICD-10-CM

## 2024-10-15 DIAGNOSIS — M7672 Peroneal tendinitis, left leg: Secondary | ICD-10-CM | POA: Diagnosis not present

## 2024-10-15 MED ORDER — MELOXICAM 15 MG PO TABS: 15.0000 mg | ORAL_TABLET | Freq: Every day | ORAL | 0 refills | Status: AC

## 2024-10-15 NOTE — Progress Notes (Signed)
  Subjective:  Patient ID: Jackie Montoya, female    DOB: [birth year],   MRN: [id number]  Chief Complaint  Patient presents with   Tendonitis    It's doing good.    73 y.o. female presents for follow-up of left peroneal tendinitis and left Achilles tendinitis.  Relates she is doing about 70% better.  She has been stretching taking the meloxicam  and using the brace..  . Denies any other pedal complaints. Denies n/v/f/c.   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 CFT <3 seconds. Normal hair growth on digits. No edema.  Skin. No lacerations or abrasions bilateral feet.  Musculoskeletal: MMT 5/5 bilateral lower extremities in DF, PF, Inversion and Eversion. Deceased ROM in DF of ankle joint.  Minimally tender to achilles insertion on left. Pain with DF PF eversion and inversion. Pain along peroneal tendon and some pain over anterior ankle.  Neurological: Sensation intact to light touch.   Assessment:   1. Peroneal tendonitis, left   2. Tendonitis, Achilles, left       Plan:  Patient was evaluated and treated and all questions answered. -Xrays reviewed. No acute fractures or dislocations noted. Skew foot deformity noted.  -Discussed Achilles insertional tendonitis and peroneal tendontisi and treatment options with patient.  Continue stretching and anti-inflammatories as  needed. Continue ankle brace as needed. -Discussed if no improvement will consider MRI/PT/EPAT/PRP injections.  -Patient to return to office as needed   Asberry Failing, DPM

## 2024-10-21 ENCOUNTER — Encounter: Payer: Self-pay | Admitting: Bariatrics

## 2024-10-21 ENCOUNTER — Ambulatory Visit: Admitting: Bariatrics

## 2024-10-21 VITALS — BP 111/71 | HR 82 | Ht 65.0 in | Wt 226.0 lb

## 2024-10-21 DIAGNOSIS — Z6837 Body mass index (BMI) 37.0-37.9, adult: Secondary | ICD-10-CM

## 2024-10-21 DIAGNOSIS — E669 Obesity, unspecified: Secondary | ICD-10-CM

## 2024-10-21 DIAGNOSIS — R632 Polyphagia: Secondary | ICD-10-CM | POA: Diagnosis not present

## 2024-10-21 DIAGNOSIS — E66812 Obesity, class 2: Secondary | ICD-10-CM

## 2024-10-21 NOTE — Progress Notes (Signed)
 WEIGHT SUMMARY AND BIOMETRICS  Weight Lost Since Last Visit: 2lb  Weight Gained Since Last Visit: 0   Vitals Temp: -- (could not obtain) BP: 111/71 Pulse Rate: 82 SpO2: 96 %   Anthropometric Measurements Height: 5' 5 (1.651 m) Weight: 226 lb (102.5 kg) BMI (Calculated): 37.61 Weight at Last Visit: 228lb Weight Lost Since Last Visit: 2lb Weight Gained Since Last Visit: 0 Starting Weight: 287lb Total Weight Loss (lbs): 61 lb (27.7 kg)   Body Composition  Body Fat %: 49.4 % Fat Mass (lbs): 111.8 lbs Muscle Mass (lbs): 108.6 lbs Total Body Water (lbs): 81.1 lbs Visceral Fat Rating : 16   Other Clinical Data Fasting: no Labs: no Today's Visit #: 45 Starting Date: 12/22/19    OBESITY Inaara is here to discuss her progress with her obesity treatment plan along with follow-up of her obesity related diagnoses.    Nutrition Plan: keeping a food journal with goal of 1300 calories and 80 grams of protein daily - 70% adherence.  Current exercise: yard work and stationary bike  Interim History:  She is down 2 lbs since her last visit.  Eating all of the food on the plan., Protein intake is as prescribed, and Water intake is adequate.   Pharmacotherapy: Jayani is on Ozempic  2 mg SQ weekly Adverse side effects: None Hunger is moderately controlled.  Cravings are moderately controlled.  Assessment/Plan:   Lashawndra Lampkins endorses excessive hunger.  Medication(s): Ozempic  Effects of medication:  moderately controlled. Cravings are moderately controlled.   Plan: Medication(s): Ozempic  2 mg SQ weekly Will increase water, protein and fiber to help assuage hunger.  Will minimize foods that have a high glucose index/load to minimize reactive hypoglycemia.  Will continue exercise both cardio and resistance.     Generalized Obesity: Current BMI BMI  (Calculated): 37.61   Pharmacotherapy Plan Continue  Ozempic  2 mg SQ weekly  Shakeela is currently in the action stage of change. As such, her goal is to continue with weight loss efforts.  She has agreed to keeping a food journal with goal of 1,300 calories and 80 grams of protein daily.  Exercise goals: Older adults should determine their level of effort for physical activity relative to their level of fitness.   Behavioral modification strategies: increase water intake, better snacking choices, planning for success, increasing fiber rich foods, avoiding temptations, keep healthy foods in the home, increase frequency of journaling, weigh protein portions, measure portion sizes, and mindful eating.  Suzanne has agreed to follow-up with our clinic in 4 weeks.     Objective:   VITALS: Per patient if applicable, see vitals. GENERAL: Alert and in no acute distress. CARDIOPULMONARY: No increased WOB. Speaking in clear sentences.  PSYCH: Pleasant and cooperative. Speech normal rate and rhythm. Affect is appropriate. Insight and judgement are appropriate. Attention is focused, linear, and appropriate.  NEURO: Oriented as  arrived to appointment on time with no prompting.   Attestation Statements:   This was prepared with the assistance of Engineer, Civil (consulting).  Occasional wrong-word or sound-a-like substitutions may have occurred due to the inherent limitations of voice recognition   Clayborne Daring, DO

## 2024-11-29 ENCOUNTER — Ambulatory Visit: Admitting: Bariatrics

## 2024-11-29 ENCOUNTER — Encounter: Payer: Self-pay | Admitting: Bariatrics

## 2024-11-29 VITALS — BP 147/84 | HR 80 | Ht 65.0 in | Wt 229.0 lb

## 2024-11-29 DIAGNOSIS — E669 Obesity, unspecified: Secondary | ICD-10-CM | POA: Diagnosis not present

## 2024-11-29 DIAGNOSIS — R7301 Impaired fasting glucose: Secondary | ICD-10-CM

## 2024-11-29 DIAGNOSIS — R632 Polyphagia: Secondary | ICD-10-CM | POA: Diagnosis not present

## 2024-11-29 DIAGNOSIS — E66812 Obesity, class 2: Secondary | ICD-10-CM

## 2024-11-29 DIAGNOSIS — Z6838 Body mass index (BMI) 38.0-38.9, adult: Secondary | ICD-10-CM | POA: Diagnosis not present

## 2024-11-29 MED ORDER — OZEMPIC (2 MG/DOSE) 8 MG/3ML ~~LOC~~ SOPN: PEN_INJECTOR | SUBCUTANEOUS | 0 refills | Status: AC

## 2024-11-29 NOTE — Progress Notes (Signed)
 WEIGHT SUMMARY AND BIOMETRICS  Weight Lost Since Last Visit: 0  Weight Gained Since Last Visit: 3lb   Vitals BP: (!) 147/84 Pulse Rate: 80 SpO2: 96 %   Anthropometric Measurements Height: 5' 5 (1.651 m) Weight: 229 lb (103.9 kg) BMI (Calculated): 38.11 Weight at Last Visit: 226lb Weight Lost Since Last Visit: 0 Weight Gained Since Last Visit: 3lb Starting Weight: 287lb Total Weight Loss (lbs): 58 lb (26.3 kg)   Body Composition  Body Fat %: 50.3 % Fat Mass (lbs): 115.6 lbs Muscle Mass (lbs): 108.4 lbs Total Body Water (lbs): 83.2 lbs Visceral Fat Rating : 17   Other Clinical Data Fasting: no Labs: no Today's Visit #: 31 Starting Date: 12/22/19    OBESITY Magan is here to discuss her progress with her obesity treatment plan along with follow-up of her obesity related diagnoses.    Nutrition Plan: keeping a food journal with goal of 1300 calories and 80 grams of protein daily - 50% adherence.  Current exercise: Stationary bike  Interim History:  She is up 3 lbs since her last visit over the holiday session. She has been stress eating.  Eating all of the food on the plan., Protein intake is as prescribed, Is not skipping meals, and Water intake is adequate.   Pharmacotherapy: Sheldon is on Ozempic  2 mg SQ weekly Adverse side effects: None Hunger is moderately controlled.  Cravings are moderately controlled.  Assessment/Plan:    Impaired Fasting Glucose:   She has a history of impaired fasting glucose.  She is working on her diet and exercise to improve this.  She is taking Ozempic  and metformin  as directed.   Plan:  Continue medications. Rx today for Ozempic  2 mg weekly with a 63-month supply with no refills. She will continue meal planning. She will continue increasing her protein fiber and water.   Polyphagia Aris endorses excessive  hunger.  Medication(s):  Ozempic  and Metformin   Effects of medication:  moderately controlled. Cravings are moderately controlled.   Plan: Medication(s): Ozempic  2 mg SQ weekly and Metformin  500 mg twice daily with meals Will increase water, protein and fiber to help assuage hunger.  Will minimize foods that have a high glucose index/load to minimize reactive hypoglycemia.     Generalized Obesity: Current BMI BMI (Calculated): 38.11   Pharmacotherapy Plan Continue and refill  Ozempic  2 mg SQ weekly and Metformin  500 mg twice daily with meals  Gizzelle is currently in the action stage of change. As such, her goal is to continue with weight loss efforts.  She has agreed to keeping a food journal with goal of 1,300 calories and 80 grams of protein daily.  Exercise goals: Older adults should determine their level of effort for physical activity relative to their level of fitness.   Behavioral modification strategies: increasing lean protein intake, no meal skipping, meal planning , increase water  intake, better snacking choices, planning for success, increasing vegetables, avoiding temptations, keep healthy foods in the home, weigh protein portions, measure portion sizes, and mindful eating.  Etrulia has agreed to follow-up with our clinic in 4 weeks.     Objective:   VITALS: Per patient if applicable, see vitals. GENERAL: Alert and in no acute distress. CARDIOPULMONARY: No increased WOB. Speaking in clear sentences.  PSYCH: Pleasant and cooperative. Speech normal rate and rhythm. Affect is appropriate. Insight and judgement are appropriate. Attention is focused, linear, and appropriate.  NEURO: Oriented as arrived to appointment on time with no prompting.   Attestation Statements:   This was prepared with the assistance of Engineer, Civil (consulting).  Occasional wrong-word or sound-a-like substitutions may have occurred due to the inherent limitations of voice recognition.   Clayborne Daring,  DO

## 2025-01-03 ENCOUNTER — Ambulatory Visit: Admitting: Bariatrics

## 2025-01-03 ENCOUNTER — Encounter: Payer: Self-pay | Admitting: Bariatrics

## 2025-01-03 VITALS — BP 126/81 | HR 79 | Ht 65.0 in | Wt 231.0 lb

## 2025-01-03 DIAGNOSIS — Z6838 Body mass index (BMI) 38.0-38.9, adult: Secondary | ICD-10-CM

## 2025-01-03 DIAGNOSIS — E669 Obesity, unspecified: Secondary | ICD-10-CM

## 2025-01-03 DIAGNOSIS — R7301 Impaired fasting glucose: Secondary | ICD-10-CM | POA: Diagnosis not present

## 2025-01-03 DIAGNOSIS — R632 Polyphagia: Secondary | ICD-10-CM | POA: Diagnosis not present

## 2025-01-03 NOTE — Progress Notes (Signed)
 "                                                                                                             WEIGHT SUMMARY AND BIOMETRICS  Weight Lost Since Last Visit: 0  Weight Gained Since Last Visit: 2lb   Vitals BP: 126/81 Pulse Rate: 79 SpO2: 97 %   Anthropometric Measurements Height: 5' 5 (1.651 m) Weight: 231 lb (104.8 kg) BMI (Calculated): 38.44 Weight at Last Visit: 229lb Weight Lost Since Last Visit: 0 Weight Gained Since Last Visit: 2lb Starting Weight: 287lb Total Weight Loss (lbs): 56 lb (25.4 kg)   Body Composition  Body Fat %: 50 % Fat Mass (lbs): 115.6 lbs Muscle Mass (lbs): 109.8 lbs Total Body Water (lbs): 81 lbs Visceral Fat Rating : 17   Other Clinical Data Fasting: no Labs: no Today's Visit #: 11 Starting Date: 12/22/19    OBESITY Jackie Montoya is here to discuss her progress with her obesity treatment plan along with follow-up of her obesity related diagnoses.    Nutrition Plan: keeping a food journal with goal of 1300 calories and 80 grams of protein daily - 60% adherence.  Current exercise: elliptical   Interim History:  She is up 2 lbs since her last visit.  She states that her spouse has been sick but now he is able to drive and it has relieved her of a lot of responsibility.  She also had to get to the holidays and a lot of events and feels as though she did more stress eating. Eating all of the food on the plan., Protein intake is as prescribed, Is not skipping meals, and Water intake is adequate.   Pharmacotherapy: Kanaya is on Ozempic  2 mg SQ weekly Adverse side effects: None, her last injection seem less effective but she gave her injection in her abdomen instead of her upper leg which is her common injection site. Cravings are moderately controlled.  Assessment/Plan:   [name] endorses excessive hunger.  Medication(s): Ozempic  Effects of medication:  moderately controlled. Cravings are moderately controlled.    Plan: Medication(s): Ozempic  2 mg SQ weekly Will increase water, protein and fiber to help assuage hunger.  Will minimize foods that have a high glucose index/load to minimize reactive hypoglycemia.  Reviewed injection site technique for her GLP-1.  Impaired fasting glucose:  Torin has had elevated fasting insulin  readings. Goal is HgbA1c < 5.7, fasting insulin  at l0 or less, and preferably at 5.  She reports slightly more polyphagia. Medication(s): Ozempic  Lab Results  Component Value Date   HGBA1C 5.1 03/01/2024   Lab Results  Component Value Date   INSULIN  9.4 03/01/2024   INSULIN  5.3 04/23/2023   INSULIN  10.8 07/11/2022   INSULIN  11.6 01/22/2022   INSULIN  7.3 07/11/2021    Plan Medication(s): Ozempic  2 mg SQ weekly Will work on the agreed upon plan. Will minimize refined carbohydrates ( sweets and starches), and focus more on complex carbohydrates.  Increase the micronutrients found in leafy greens, which include magnesium, polyphenols, and vitamin C which  have been postulated to help with insulin  sensitivity. Minimize fast food and cook more meals at home.  Increase fiber to 25 to 30 grams daily.  Will increase her exercise both resistance and cardio.   Generalized Obesity: Current BMI BMI (Calculated): 38.44   Pharmacotherapy Plan Continue and refill  Ozempic  2 mg SQ weekly  [name] is currently in the action stage of change. As such, her goal is to continue with weight loss efforts.  She has agreed to keeping a food journal with goal of 1,300 calories and 80 grams of protein daily.  Exercise goals: Older adults should determine their level of effort for physical activity relative to their level of fitness.  She will incorporate more walking and other exercise.   Behavioral modification strategies: increasing lean protein intake, decreasing simple carbohydrates , no meal skipping, meal planning , increase water intake, better snacking choices, planning for  success, increasing vegetables, keep healthy foods in the home, weigh protein portions, work on smaller portions, and mindful eating.  [name] has agreed to follow-up with our clinic in 6 weeks.   Objective:   VITALS: Per patient if applicable, see vitals. GENERAL: Alert and in no acute distress. CARDIOPULMONARY: No increased WOB. Speaking in clear sentences.  PSYCH: Pleasant and cooperative. Speech normal rate and rhythm. Affect is appropriate. Insight and judgement are appropriate. Attention is focused, linear, and appropriate.  NEURO: Oriented as arrived to appointment on time with no prompting.   Attestation Statements:   This was prepared with the assistance of Engineer, Civil (consulting).  Occasional wrong-word or sound-a-like substitutions may have occurred due to the inherent limitations of voice recognition.   Clayborne Daring, DO    "

## 2025-02-14 ENCOUNTER — Ambulatory Visit: Admitting: Bariatrics

## 2025-02-22 ENCOUNTER — Ambulatory Visit: Admitting: Hematology and Oncology
